# Patient Record
Sex: Male | Born: 1975 | Race: White | Hispanic: No | Marital: Married | Smoking: Never smoker
Health system: Southern US, Community
[De-identification: ages and names within clinical notes are randomized; demographics above are authoritative.]

## PROBLEM LIST (undated history)

## (undated) DIAGNOSIS — R51 Headache: Secondary | ICD-10-CM

## (undated) DIAGNOSIS — K219 Gastro-esophageal reflux disease without esophagitis: Secondary | ICD-10-CM

## (undated) DIAGNOSIS — J301 Allergic rhinitis due to pollen: Secondary | ICD-10-CM

## (undated) DIAGNOSIS — R519 Headache, unspecified: Secondary | ICD-10-CM

## (undated) DIAGNOSIS — B019 Varicella without complication: Secondary | ICD-10-CM

## (undated) DIAGNOSIS — M1990 Unspecified osteoarthritis, unspecified site: Secondary | ICD-10-CM

## (undated) HISTORY — PX: WISDOM TOOTH EXTRACTION: SHX21

## (undated) HISTORY — DX: Headache: R51

## (undated) HISTORY — DX: Allergic rhinitis due to pollen: J30.1

## (undated) HISTORY — DX: Varicella without complication: B01.9

## (undated) HISTORY — DX: Gastro-esophageal reflux disease without esophagitis: K21.9

## (undated) HISTORY — DX: Headache, unspecified: R51.9

---

## 2014-01-14 ENCOUNTER — Ambulatory Visit
Admission: RE | Admit: 2014-01-14 | Discharge: 2014-01-14 | Disposition: A | Discharge status: Home / Self Care | Payer: 59 | Source: Ambulatory Visit | Attending: Neurology | Admitting: Neurology

## 2014-01-14 ENCOUNTER — Other Ambulatory Visit: Payer: Self-pay | Admitting: Neurology

## 2014-01-14 DIAGNOSIS — M542 Cervicalgia: Secondary | ICD-10-CM

## 2014-01-14 IMAGING — CR DG CERVICAL SPINE COMPLETE 4+V
5 series · 5 of 5 positions shown · non-contrast
Comparison: None.

CLINICAL DATA: Right neck pain.  Headache.

EXAM:
CERVICAL SPINE  4+ VIEWS, including flexion and extension views

[view not recorded (1 of 5)]
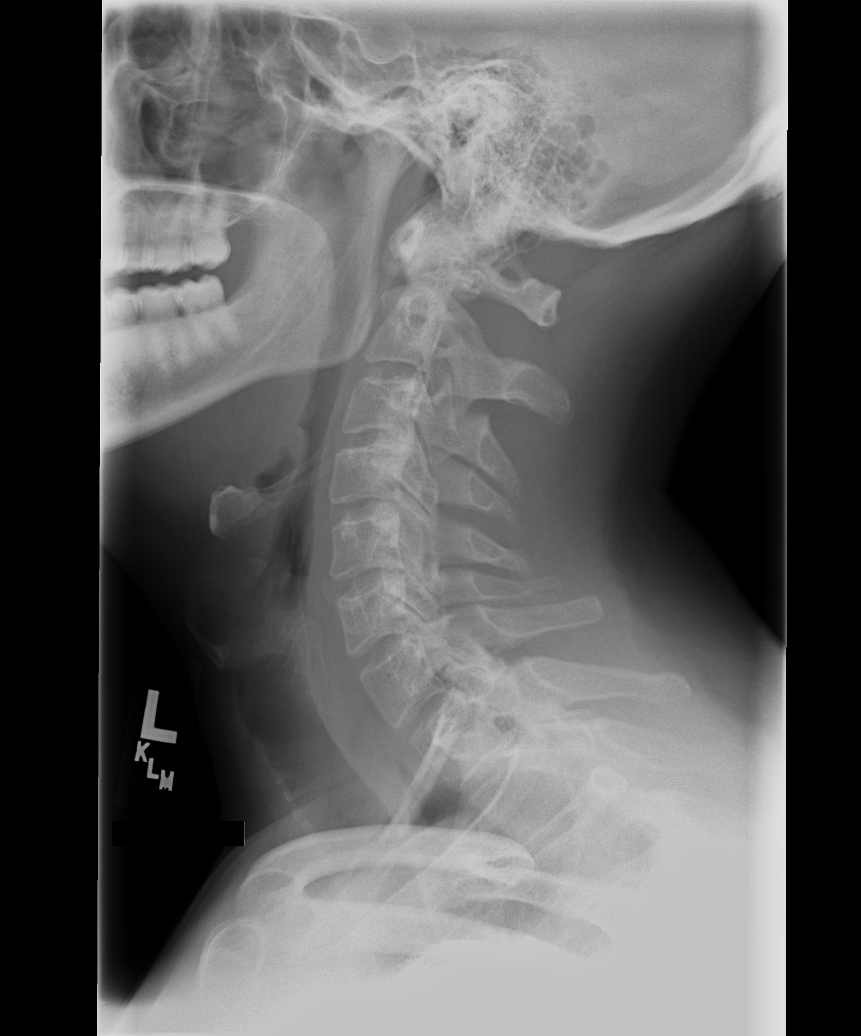

[view not recorded (2 of 5)]
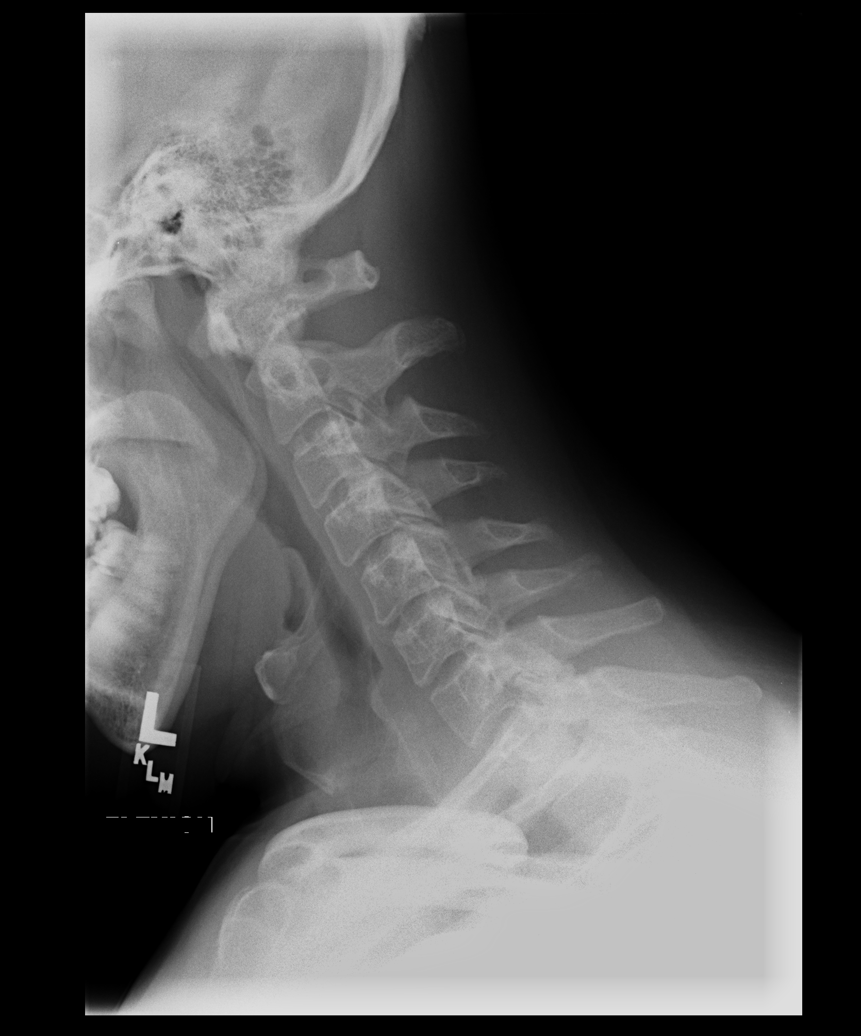

[view not recorded (3 of 5)]
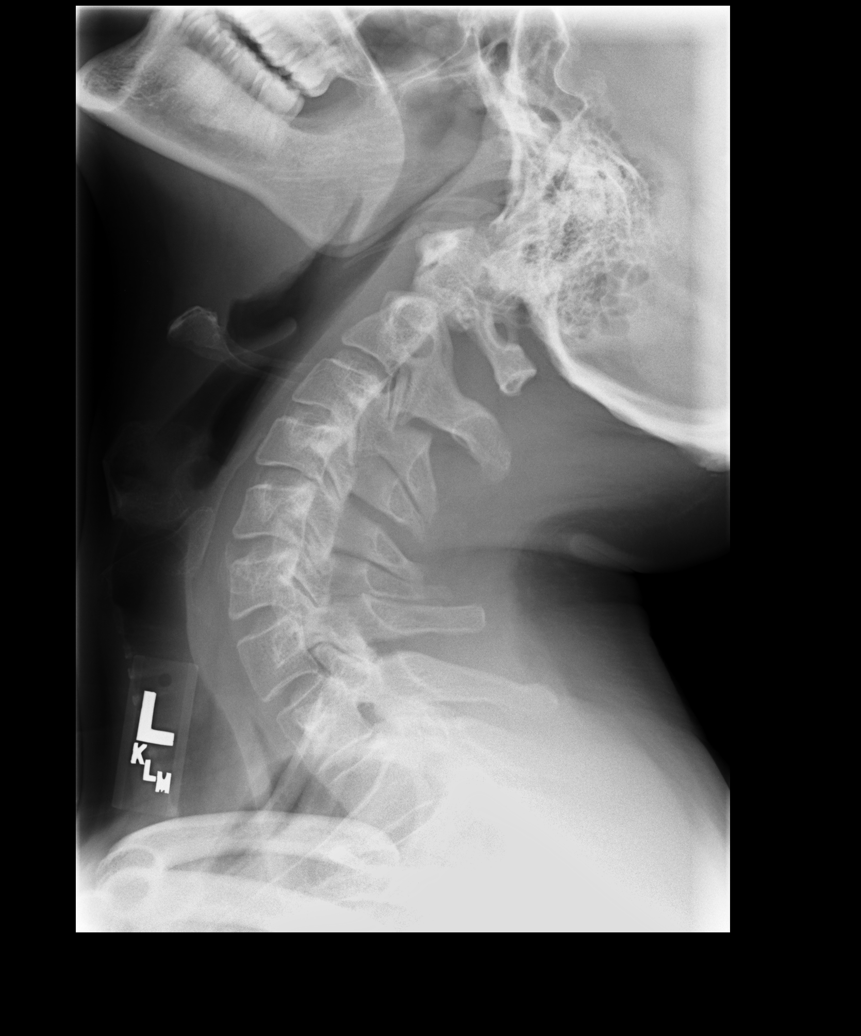

[view not recorded (4 of 5)]
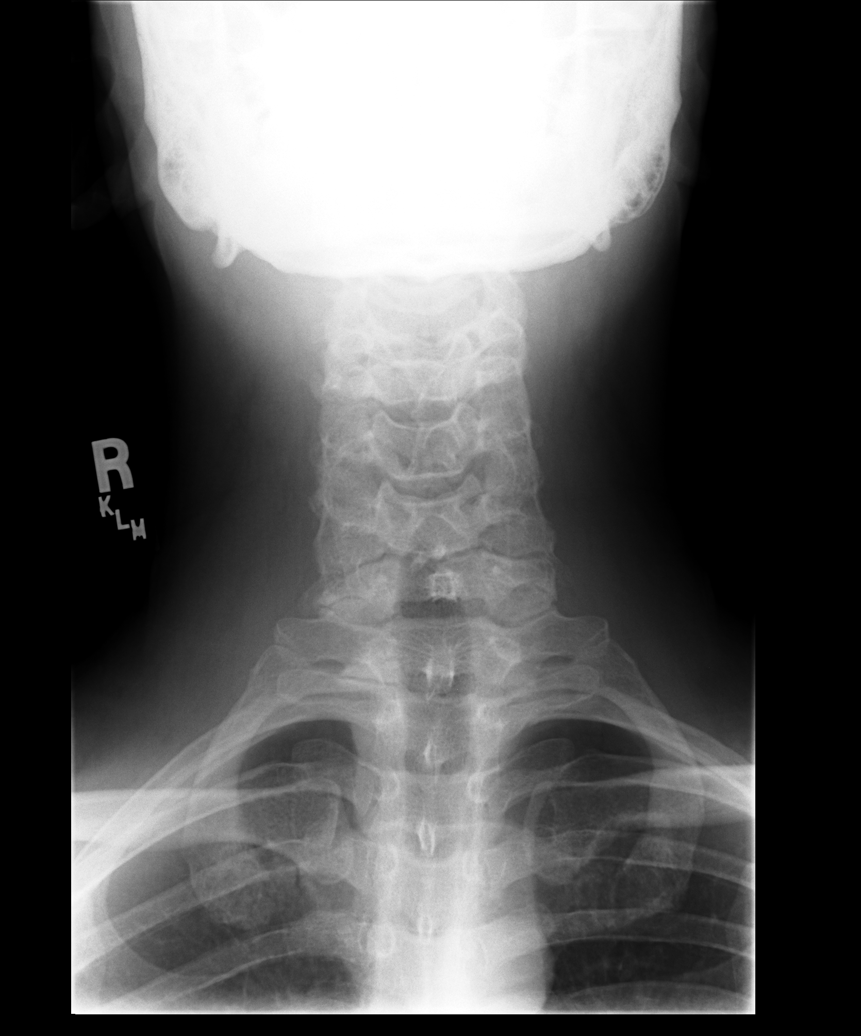

[view not recorded (5 of 5)]
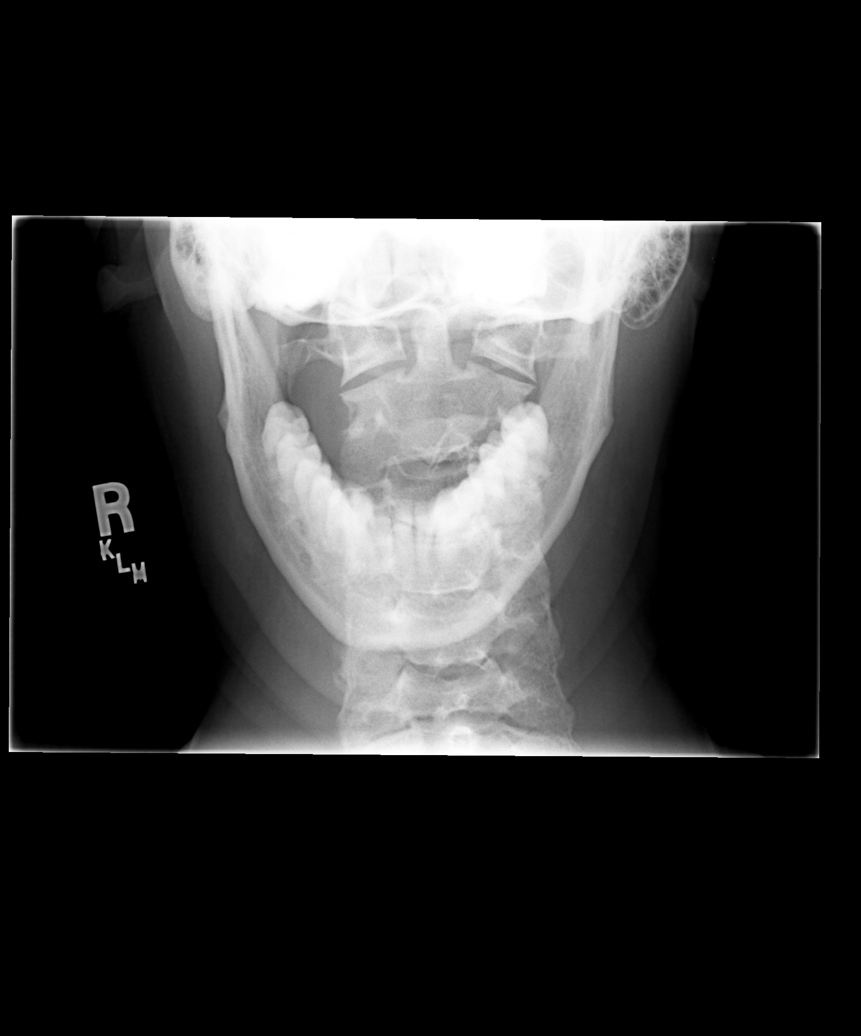

[5 of 5 positions shown; findings below may reference images not displayed]

FINDINGS: Mild loss of intervertebral disc height posteriorly at C3-4 with
mild posterior osseous ridging. No malalignment or abnormal motion
with flexion and extension. The spinous processes of C3 and C4 are
closely positioned in the neutral position, possibly related to the
disc space narrowing posteriorly.

No acute bony findings.
IMPRESSION: 1. Mild disc space narrowing posteriorly at C3-4 associated with
posterior osseous ridging. No abnormal subluxation with
flexion/extension

## 2016-01-19 ENCOUNTER — Ambulatory Visit (INDEPENDENT_AMBULATORY_CARE_PROVIDER_SITE_OTHER): Payer: 59

## 2016-01-19 ENCOUNTER — Encounter: Payer: Self-pay | Admitting: Podiatry

## 2016-01-19 ENCOUNTER — Ambulatory Visit (INDEPENDENT_AMBULATORY_CARE_PROVIDER_SITE_OTHER): Payer: 59 | Admitting: Podiatry

## 2016-01-19 VITALS — BP 119/85 | HR 91 | Resp 16 | Ht 68.0 in | Wt 190.0 lb

## 2016-01-19 DIAGNOSIS — M205X1 Other deformities of toe(s) (acquired), right foot: Secondary | ICD-10-CM | POA: Diagnosis not present

## 2016-01-19 DIAGNOSIS — M722 Plantar fascial fibromatosis: Secondary | ICD-10-CM

## 2016-01-19 MED ORDER — DICLOFENAC SODIUM 75 MG PO TBEC: 75.0000 mg | DELAYED_RELEASE_TABLET | Freq: Two times a day (BID) | ORAL | Status: DC

## 2016-01-19 NOTE — Patient Instructions (Signed)

## 2016-01-19 NOTE — Progress Notes (Signed)
   Subjective:    Patient ID: Gary Hayes, male    DOB: 1976/06/09, 40 y.o.   MRN: [id number]  HPI Patient presents with bilateral foot pain; arch & heel; pt stated, "has more pain in the arch"; on & off; x6 months.   Review of Systems  Musculoskeletal: Positive for myalgias.  All other systems reviewed and are negative.      Objective:   Physical Exam        Assessment & Plan:

## 2016-01-19 NOTE — Progress Notes (Signed)
Subjective:     Patient ID: Marikay Alaravid Kuck, male   DOB: [birth year], 40 y.o.   MRN: [id number]  HPI patient states I get pain in my arch right over left distal portion and it makes it hard for me to walk comfortably. Been going on for a number years and I like to run but I have not been able to run recently due to the discomfort and the pain has been more acute for the last 6 months   Review of Systems  All other systems reviewed and are negative.      Objective:   Physical Exam  Constitutional: He is oriented to person, place, and time.  Cardiovascular: Intact distal pulses.   Musculoskeletal: Normal range of motion.  Neurological: He is oriented to person, place, and time.  Skin: Skin is warm.  Nursing note and vitals reviewed.  neurovascular status intact muscle strength adequate range of motion was found to be within normal limits with patient found to have discomfort in the right distal arch of a moderate nature with what appears to be functional hallux limitus right over left. Moderate depression of the arch is noted upon weightbearing and is found to have good digital perfusion and is well oriented 3     Assessment:     Functional hallux limitus leading to probable chronic inflammation of the plantar arch of a moderate nature    Plan:     H&P x-rays reviewed condition discussed. At this point I recommended orthotics to provide for support the plantar arch and also to help plantar flex the first metatarsal and I placed on diclofenac 75 mg twice a day and gave stretching exercises. May require injection treatment or other modalities  X-ray report indicates that there is mild elevation first MPJ right and there is some changes in the lateral side of the joint surface. Minimal plantar spur formation noted

## 2016-02-09 ENCOUNTER — Ambulatory Visit: Payer: 59 | Admitting: *Deleted

## 2016-02-09 DIAGNOSIS — M722 Plantar fascial fibromatosis: Secondary | ICD-10-CM

## 2016-02-09 NOTE — Patient Instructions (Signed)

## 2016-02-09 NOTE — Progress Notes (Signed)
Patient ID: Gary Hayes, male   DOB: October 19, 1976, 40 y.o.   MRN: [id number] Patient presents for orthotic pick up.  Verbal and written break in and wear instructions given.  Patient will follow up in 4 weeks if symptoms worsen or fail to improve.

## 2016-11-13 ENCOUNTER — Telehealth: Payer: Self-pay | Admitting: Podiatry

## 2016-11-13 NOTE — Telephone Encounter (Signed)
Pt. Needs medical release form faxed over. (needs medical records and disc of x-ray)

## 2016-11-14 NOTE — Telephone Encounter (Signed)
I emailed the patient a copy of the medical records release form and let him know that if he wants a copy of his x-rays to a cd there is a $5.00 fee.

## 2016-11-15 DIAGNOSIS — R52 Pain, unspecified: Secondary | ICD-10-CM

## 2016-11-24 DIAGNOSIS — M79671 Pain in right foot: Secondary | ICD-10-CM | POA: Diagnosis not present

## 2016-11-24 DIAGNOSIS — M6701 Short Achilles tendon (acquired), right ankle: Secondary | ICD-10-CM | POA: Diagnosis not present

## 2016-11-24 DIAGNOSIS — M216X1 Other acquired deformities of right foot: Secondary | ICD-10-CM | POA: Diagnosis not present

## 2016-12-07 DIAGNOSIS — M6701 Short Achilles tendon (acquired), right ankle: Secondary | ICD-10-CM | POA: Diagnosis not present

## 2017-02-26 DIAGNOSIS — J329 Chronic sinusitis, unspecified: Secondary | ICD-10-CM | POA: Diagnosis not present

## 2017-02-26 DIAGNOSIS — B9789 Other viral agents as the cause of diseases classified elsewhere: Secondary | ICD-10-CM | POA: Diagnosis not present

## 2017-03-02 DIAGNOSIS — R030 Elevated blood-pressure reading, without diagnosis of hypertension: Secondary | ICD-10-CM | POA: Diagnosis not present

## 2017-03-02 DIAGNOSIS — J208 Acute bronchitis due to other specified organisms: Secondary | ICD-10-CM | POA: Diagnosis not present

## 2018-09-13 DIAGNOSIS — M79672 Pain in left foot: Secondary | ICD-10-CM | POA: Diagnosis not present

## 2018-12-20 ENCOUNTER — Ambulatory Visit (INDEPENDENT_AMBULATORY_CARE_PROVIDER_SITE_OTHER): Payer: 59 | Admitting: Family Medicine

## 2018-12-20 ENCOUNTER — Encounter: Payer: Self-pay | Admitting: Family Medicine

## 2018-12-20 VITALS — BP 104/80 | HR 82 | Temp 97.5°F | Ht 68.0 in | Wt 201.0 lb

## 2018-12-20 DIAGNOSIS — M6289 Other specified disorders of muscle: Secondary | ICD-10-CM

## 2018-12-20 DIAGNOSIS — Z7689 Persons encountering health services in other specified circumstances: Secondary | ICD-10-CM | POA: Diagnosis not present

## 2018-12-20 DIAGNOSIS — M722 Plantar fascial fibromatosis: Secondary | ICD-10-CM

## 2018-12-20 DIAGNOSIS — S43006A Unspecified dislocation of unspecified shoulder joint, initial encounter: Secondary | ICD-10-CM

## 2018-12-20 NOTE — Progress Notes (Signed)
Patient presents to clinic today to establish care.  SUBJECTIVE: PMH:  Pt is a 43 yo male with pmh sig for plantar fasciitis and h/o L shoulder separation.  Pt was being seen by UC.  Plantar fasciitis: -x yrs -tried stretching, yoga, orthotics, PT, ibuprofen -[facility] -now noticing tightness in posterior thigh and calf. -denies edema   H/o L shoulder separation: -occurred 20+ yrs ago -now noticing pain in L shoulder off and on  -may happen with certain movements or if lying on shoulder -will take ibuprofen prn  Allergies: NKDA  Past surgical hx: None  Social hx: Pt is married.  Pt works as an Acupuncturist.  Pt denies EtOH, tobacco, and drug use.   Family medical history: Mom-diabetes Dad-liver transplant 2/2 cirrhosis and tumor.  History reviewed. No pertinent past medical history.  History reviewed. No pertinent surgical history.  No current outpatient medications on file prior to visit.   No current facility-administered medications on file prior to visit.     No Known Allergies  History reviewed. No pertinent family history.  Social History   Socioeconomic History  . Marital status: Married    Spouse name: Not on file  . Number of children: Not on file  . Years of education: Not on file  . Highest education level: Not on file  Occupational History  . Not on file  Social Needs  . Financial resource strain: Not on file  . Food insecurity:    Worry: Not on file    Inability: Not on file  . Transportation needs:    Medical: Not on file    Non-medical: Not on file  Tobacco Use  . Smoking status: Never Smoker  . Smokeless tobacco: Never Used  Substance and Sexual Activity  . Alcohol use: Never    Alcohol/week: 0.0 standard drinks    Frequency: Never  . Drug use: Never  . Sexual activity: Not on file  Lifestyle  . Physical activity:    Days per week: Not on file    Minutes per session: Not  on file  . Stress: Not on file  Relationships  . Social connections:    Talks on phone: Not on file    Gets together: Not on file    Attends religious service: Not on file    Active member of club or organization: Not on file    Attends meetings of clubs or organizations: Not on file    Relationship status: Not on file  . Intimate partner violence:    Fear of current or ex partner: Not on file    Emotionally abused: Not on file    Physically abused: Not on file    Forced sexual activity: Not on file  Other Topics Concern  . Not on file  Social History Narrative  . Not on file    ROS General: Denies fever, chills, night sweats, changes in weight, changes in appetite HEENT: Denies headaches, ear pain, changes in vision, rhinorrhea, sore throat CV: Denies CP, palpitations, SOB, orthopnea Pulm: Denies SOB, cough, wheezing GI: Denies abdominal pain, nausea, vomiting, diarrhea, constipation GU: Denies dysuria, hematuria, frequency, vaginal discharge Msk: Denies muscle cramps, joint pains  +R leg and foot pain, L shoulder pain Neuro: Denies weakness, numbness, tingling Skin: Denies rashes, bruising Psych: Denies depression, anxiety, hallucinations   BP 104/80 (BP Location: Left Arm, Patient Position: Sitting, Cuff Size: Normal)   Pulse 82  Temp (!) 97.5 F (36.4 C) (Oral)   Ht 5\' 8"  (1.727 m)   Wt 201 lb (91.2 kg)   SpO2 97%   BMI 30.56 kg/m   Physical Exam Gen. Pleasant, well developed, well-nourished, in NAD HEENT - [place]/AT, PERRL, EOMI, conjunctive clear, no scleral icterus, no nasal drainage, pharynx without erythema or exudate. Lungs: no use of accessory muscles, CTAB, no wheezes, rales or rhonchi Cardiovascular: RRR,  No r/g/m, no peripheral edema Abdomen: BS present, soft, nontender,nondistended Musculoskeletal: No TTP of R leg, calf, or foot.  Negative Homans sign.  No deformities, moves all four extremities, no cyanosis or clubbing, normal tone, normal  strength. Neuro:  A&Ox3, CN II-XII intact, patient favors right leg when ambulating.  Has a wide-based gait Skin:  Warm, dry, intact, no lesions  No results found for this or any previous visit (from the past 2160 hour(s)).  Assessment/Plan: Plantar fasciitis  -Discussed continuing stretching -Use ibuprofen sparingly -Given handout - Plan: Ambulatory referral to Sports Medicine  Tightness of right gastrocnemius muscle -Continue stretching - Plan: Ambulatory referral to Sports Medicine  Shoulder separation -History of 20+ years ago -Likely with arthritis s/p injury -Discussed using ibuprofen sparingly, heat -Consider shoulder x-ray for continued symptoms  Encounter to establish care -We reviewed the PMH, PSH, FH, SH, Meds and Allergies. -We provided refills for any medications we will prescribe as needed. -We addressed current concerns per orders and patient instructions. -We have asked for records for pertinent exams, studies, vaccines and notes from previous providers. -We have advised patient to follow up per instructions below.  Follow-up PRN  Abbe Amsterdam, MD

## 2018-12-20 NOTE — Patient Instructions (Signed)
Plantar Fasciitis    Plantar fasciitis is a painful foot condition that affects the heel. It occurs when the band of tissue that connects the toes to the heel bone (plantar fascia) becomes irritated. This can happen as the result of exercising too much or doing other repetitive activities (overuse injury).  The pain from plantar fasciitis can range from mild irritation to severe pain that makes it difficult to walk or move. The pain is usually worse in the morning after sleeping, or after sitting or lying down for a while. Pain may also be worse after long periods of walking or standing.  What are the causes?  This condition may be caused by:   Standing for long periods of time.   Wearing shoes that do not have good arch support.   Doing activities that put stress on joints (high-impact activities), including running, aerobics, and ballet.   Being overweight.   An abnormal way of walking (gait).   Tight muscles in the back of your lower leg (calf).   High arches in your feet.   Starting a new athletic activity.  What are the signs or symptoms?  The main symptom of this condition is heel pain. Pain may:   Be worse with first steps after a time of rest, especially in the morning after sleeping or after you have been sitting or lying down for a while.   Be worse after long periods of standing still.   Decrease after 30-45 minutes of activity, such as gentle walking.  How is this diagnosed?  This condition may be diagnosed based on your medical history and your symptoms. Your health care provider may ask questions about your activity level. Your health care provider will do a physical exam to check for:   A tender area on the bottom of your foot.   A high arch in your foot.   Pain when you move your foot.   Difficulty moving your foot.  You may have imaging tests to confirm the diagnosis, such as:   X-rays.   Ultrasound.   MRI.  How is this treated?  Treatment for plantar fasciitis depends on how  severe your condition is. Treatment may include:   Rest, ice, applying pressure (compression), and raising the affected foot (elevation). This may be called RICE therapy. Your health care provider may recommend RICE therapy along with over-the-counter pain medicines to manage your pain.   Exercises to stretch your calves and your plantar fascia.   A splint that holds your foot in a stretched, upward position while you sleep (night splint).   Physical therapy to relieve symptoms and prevent problems in the future.   Injections of steroid medicine (cortisone) to relieve pain and inflammation.   Stimulating your plantar fascia with electrical impulses (extracorporeal shock wave therapy). This is usually the last treatment option before surgery.   Surgery, if other treatments have not worked after 12 months.  Follow these instructions at home:    Managing pain, stiffness, and swelling   If directed, put ice on the painful area:  ? Put ice in a plastic bag, or use a frozen bottle of water.  ? Place a towel between your skin and the bag or bottle.  ? Roll the bottom of your foot over the bag or bottle.  ? Do this for 20 minutes, 2-3 times a day.   Wear athletic shoes that have air-sole or gel-sole cushions, or try wearing soft shoe inserts that are designed for plantar   fasciitis.   Raise (elevate) your foot above the level of your heart while you are sitting or lying down.  Activity   Avoid activities that cause pain. Ask your health care provider what activities are safe for you.   Do physical therapy exercises and stretches as told by your health care provider.   Try activities and forms of exercise that are easier on your joints (low-impact). Examples include swimming, water aerobics, and biking.  General instructions   Take over-the-counter and prescription medicines only as told by your health care provider.   Wear a night splint while sleeping, if told by your health care provider. Loosen the splint  if your toes tingle, become numb, or turn cold and blue.   Maintain a healthy weight, or work with your health care provider to lose weight as needed.   Keep all follow-up visits as told by your health care provider. This is important.  Contact a health care provider if you:   Have symptoms that do not go away after caring for yourself at home.   Have pain that gets worse.   Have pain that affects your ability to move or do your daily activities.  Summary   Plantar fasciitis is a painful foot condition that affects the heel. It occurs when the band of tissue that connects the toes to the heel bone (plantar fascia) becomes irritated.   The main symptom of this condition is heel pain that may be worse after exercising too much or standing still for a long time.   Treatment varies, but it usually starts with rest, ice, compression, and elevation (RICE therapy) and over-the-counter medicines to manage pain.  This information is not intended to replace advice given to you by your health care provider. Make sure you discuss any questions you have with your health care provider.  Document Released: 07/04/2001 Document Revised: 08/06/2017 Document Reviewed: 08/06/2017  [employer]vier Interactive Patient Education  2019 [employer]        Plantar Fasciitis Rehab  Ask your health care provider which exercises are safe for you. Do exercises exactly as told by your health care provider and adjust them as directed. It is normal to feel mild stretching, pulling, tightness, or discomfort as you do these exercises, but you should stop right away if you feel sudden pain or your pain gets worse. Do not begin these exercises until told by your health care provider.  Stretching and range of motion exercises  These exercises warm up your muscles and joints and improve the movement and flexibility of your foot. These exercises also help to relieve pain.  Exercise A: Plantar fascia stretch    1. Sit with your left / right leg crossed  over your opposite knee.  2. Hold your heel with one hand with that thumb near your arch. With your other hand, hold your toes and gently pull them back toward the top of your foot. You should feel a stretch on the bottom of your toes or your foot or both.  3. Hold this stretch for__________ seconds.  4. Slowly release your toes and return to the starting position.  Repeat __________ times. Complete this exercise __________ times a day.  Exercise B: Gastroc, standing    1. Stand with your hands against a wall.  2. Extend your left / right leg behind you, and bend your front knee slightly.  3. Keeping your heels on the floor and keeping your back knee straight, shift your weight toward the   wall without arching your back. You should feel a gentle stretch in your left / right calf.  4. Hold this position for __________ seconds.  Repeat __________ times. Complete this exercise __________ times a day.  Exercise C: Soleus, standing  1. Stand with your hands against a wall.  2. Extend your left / right leg behind you, and bend your front knee slightly.  3. Keeping your heels on the floor, bend your back knee and slightly shift your weight over the back leg. You should feel a gentle stretch deep in your calf.  4. Hold this position for __________ seconds.  Repeat __________ times. Complete this exercise __________ times a day.  Exercise D: Gastrocsoleus, standing  1. Stand with the ball of your left / right foot on a step. The ball of your foot is on the walking surface, right under your toes.  2. Keep your other foot firmly on the same step.  3. Hold onto the wall or a railing for balance.  4. Slowly lift your other foot, allowing your body weight to press your heel down over the edge of the step. You should feel a stretch in your left / right calf.  5. Hold this position for __________ seconds.  6. Return both feet to the step.  7. Repeat this exercise with a slight bend in your left / right knee.  Repeat __________ times  with your left / right knee straight and __________ times with your left / right knee bent. Complete this exercise __________ times a day.  Balance exercise  This exercise builds your balance and strength control of your arch to help take pressure off your plantar fascia.  Exercise E: Single leg stand  1. Without shoes, stand near a railing or in a doorway. You may hold onto the railing or door frame as needed.  2. Stand on your left / right foot. Keep your big toe down on the floor and try to keep your arch lifted. Do not let your foot roll inward.  3. Hold this position for __________ seconds.  4. If this exercise is too easy, you can try it with your eyes closed or while standing on a pillow.  Repeat __________ times. Complete this exercise __________ times a day.  This information is not intended to replace advice given to you by your health care provider. Make sure you discuss any questions you have with your health care provider.  Document Released: 10/09/2005 Document Revised: 06/13/2016 Document Reviewed: 08/23/2015  Elsevier Interactive Patient Education  2019 [employer]

## 2018-12-24 ENCOUNTER — Encounter: Payer: Self-pay | Admitting: Sports Medicine

## 2018-12-24 ENCOUNTER — Ambulatory Visit: Payer: Self-pay

## 2018-12-24 ENCOUNTER — Ambulatory Visit (INDEPENDENT_AMBULATORY_CARE_PROVIDER_SITE_OTHER): Payer: 59 | Admitting: Sports Medicine

## 2018-12-24 VITALS — BP 120/80 | HR 90 | Ht 68.0 in | Wt 200.8 lb

## 2018-12-24 DIAGNOSIS — M24551 Contracture, right hip: Secondary | ICD-10-CM | POA: Diagnosis not present

## 2018-12-24 DIAGNOSIS — M6289 Other specified disorders of muscle: Secondary | ICD-10-CM | POA: Diagnosis not present

## 2018-12-24 DIAGNOSIS — M76829 Posterior tibial tendinitis, unspecified leg: Secondary | ICD-10-CM | POA: Diagnosis not present

## 2018-12-24 MED ORDER — GABAPENTIN 300 MG PO CAPS: ORAL_CAPSULE | ORAL | 1 refills | Status: DC

## 2018-12-24 NOTE — Procedures (Signed)
LIMITED MSK ULTRASOUND OF Right ankle, foot Images were obtained and interpreted by myself, Gaspar Bidding, DO  Images have been saved and stored to PACS system. Images obtained on: GE S7 Ultrasound machine  FINDINGS:   Plantar fascia is normal-appearing with a normal thickness.  There is no significant surrounding hypoechoic change.  Achilles tendon is normal thickness with no surrounding or interstitial changes.    Musculotendinous junction of the calf and medial gastroc are normal.  IMPRESSION:  1. Normal-appearing plantar fascia 2. Normal-appearing Achilles tendon 3. Musculotendinous junction of the gastroc and Achilles is normal-appearing however there does appear to be some denervation atrophy of the soleus muscle.

## 2018-12-24 NOTE — Progress Notes (Signed)
Gary Hayes. Gary Hayes at [facility] [id number]  Gary Hayes - 43 y.o. male MRN [id number]  Date of birth: 09/05/1976  Visit Date: December 24, 2018  PCP: Gary Saint, MD   Referred by: Gary Saint, MD  SUBJECTIVE:  Chief Complaint  Patient presents with  . New Patient (Initial Visit)    R calf and plantar fasciitis.  Ref by Dr. Salomon Hayes.  Has been doing calf and PF stretching.  Orthotics in the past.    HPI: Patient presents with a long history of right leg and calf pain that is been worsened while he was previously running on a regular basis.  He has had just generalized tightness that is resulted in some posterior calcaneal pain both on the plantar aspect and the pure posterior aspect.  He has had custom orthotics previously fabricated.  He is undergone physical therapy has been working with stretching and yoga exercises.  He has had issues with metatarsalgia and custom insoles have made first ray cut out that it provided some benefit to his great toe pain.  He has had plantar fascia exercise as well as Achilles stretching that have not provided significant benefit.  He does report some underlying back pain but this is minimal.  REVIEW OF SYSTEMS: No significant nighttime awakenings due to this issue. Denies fevers, chills, recent weight gain or weight loss.  No night sweats.  Pt denies any change in bowel or bladder habits, muscle weakness, numbness or falls associated with this pain. Otherwise 12 point review of systems performed and is negative   HISTORY:  Prior history reviewed and updated per electronic medical record.  Patient Active Problem List   Diagnosis Date Noted  . Plantar fasciitis 12/20/2018  . Tightness of right gastrocnemius muscle 12/20/2018  . Shoulder separation 12/20/2018   Social History   Occupational History  . Not on file  Tobacco Use  . Smoking status: Never Smoker  . Smokeless tobacco:  Never Used  Substance and Sexual Activity  . Alcohol use: Never    Alcohol/week: 0.0 standard drinks    Frequency: Never  . Drug use: Never  . Sexual activity: Not on file   Social History   Social History Narrative  . Not on file   Past Medical History:  Diagnosis Date  . Chicken pox   . Frequent headaches   . GERD (gastroesophageal reflux disease)   . Hay fever    History reviewed. No pertinent surgical history. family history includes Cancer in his father; Diabetes in his mother; Heart disease in his paternal grandmother.  OBJECTIVE:  VS:  HT:5\' 8"  (172.7 cm)   WT:200 lb 12.8 oz (91.1 kg)  BMI:30.54    BP:120/80  HR:90bpm  TEMP: ( )  RESP:93 %   PHYSICAL EXAM: CONSTITUTIONAL: Well-developed, Well-nourished and In no acute distress EYES: Pupils are equal., EOM intact without nystagmus. and No scleral icterus. Psychiatric: Alert & appropriately interactive. and Not depressed or anxious appearing. EXTREMITY EXAM: Warm and well perfused  Bilateral lower extremities are overall well aligned.  He does have tightness within his gastroc right worse than left.  He has poor posterior tibialis recruitment with toe off on the right.  Additionally he does have a right hip flexor contracture while laying supine.  He has some weakness with his hip abductor's as well.  Negative straight leg raise although he does have some pain with popliteal compression test on the right.  ASSESSMENT:   1. Tightness of right gastrocnemius muscle   2. Posterior tibialis tendon insufficiency   3. Right hip flexor tightness     PROCEDURES:  PROCEDURE NOTE: THERAPEUTIC EXERCISES (28206)   Discussed the foundation of treatment for this condition is physical therapy and/or daily (5-6 days/week) therapeutic exercises, focusing on core strengthening, coordination, neuromuscular control/reeducation. 15 minutes spent for Therapeutic exercises as below and as referenced in the AVS. This included exercises  focusing on stretching, strengthening, with significant focus on eccentric aspects.  Proper technique shown and discussed handout in great detail with ATC. All questions were discussed and answered.   Long term goals include an improvement in range of motion, strength, endurance as well as avoiding reinjury. Frequency of visits is one time as determined during today's office visit. Frequency of exercises to be performed is as per handout.  EXERCISES REVIEWED:   Gary Hayes Exercises Pelvic recruitment/tilting Hip flexor stretching Pigeon Toed walking      PLAN:  Pertinent additional documentation may be included in corresponding procedure notes, imaging studies, problem based documentation and patient instructions.  No problem-specific Assessment & Plan notes found for this encounter.   Actually that some of the pain is coming from is coming from a radiculitis from his back.  No red flag symptoms will defer further diagnostic evaluation this time we will start him on gabapentin as well as more of a core recruitment and strengthening program.  If any lack of improvement can consider further diagnostic evaluation of his lumbar spine with plain film x-rays and possible MRI.  The other consideration for this type of issue could be exertional compartment syndrome versus popliteal artery entrapment syndrome.  The custom orthotics that he have do appear to be adequate and given the findings on the ultrasound today as well as somewhat normal x-rays reported per emergent Ortho as well as his prior x-rays from [facility] further evaluation of his actual intrinsic foot structures are likely of low utility.  Links to [employer] provided today per Patient Instructions.  These exercises were developed by Gary Hayes, DC with a strong emphasis on core neuromuscular reducation and postural realignment through body-weight exercises.  Home Therapeutic exercises prescribed today per procedure  note.  Activity modifications and the importance of avoiding exacerbating activities (limiting pain to no more than a 4 / 10 during or following activity) recommended and discussed.  Discussed red flag symptoms that warrant earlier emergent evaluation and patient voices understanding.  Meds ordered this encounter  Medications  . gabapentin (NEURONTIN) 300 MG capsule    Sig: Start with 1 tab po qhs X 1 week, then increase to 1 tab po bid X 1 week then 1 tab po tid prn    Dispense:  90 capsule    Refill:  1   Lab Orders  No laboratory test(s) ordered today    Imaging Orders     [place] [facility] POCT ULTRASOUND Referral Orders  No referral(s) requested today    Return in about 6 weeks (around 02/04/2019).          Andrena Mews, DO    [facility]

## 2018-12-24 NOTE — Patient Instructions (Addendum)
Also check out [employer]" which is a program developed by Dr. Myles Lipps.   There are links to a couple of his YouTube Videos below and I would like to see you performing one of his videos 5-6 days per week.  It is best to do these exercises first thing in the morning.  They will give you a good jumpstart here today and start normalizing the way you move.  A good intro video is: "Independence from Pain 7-minute Video" - [email or web address]   A more advanced video is: Scientist, research (medical) original 12 minutes" - [email or web address]  Exercises that focus more on the neck are as below: Dr. Derrill Kay with [employer] teaching neck and shoulder details Part 1 - https://[email or web address].[email or web address] Part 2 Dr. Derrill Kay with [facility] quick routine to practice daily - https://youtu.be/[email or web address]  Do not try to attempt the entire video when first beginning.  Try breaking of each exercise that he goes into shorter segments.  In other words, if they perform an exercise for 45 seconds, start with 15 seconds and rest and then resume when they begin the new activity.  If you work your way up to being able to do these videos without having to stop, I expect you will see significant improvements in your pain.  If you enjoy his videos and would like to find out more you can look on his website: [email or web address].  He has a workout streaming option as well as a DVD set available for purchase.  Amazon has the best price for his DVDs.     Please perform the exercise program that we have prepared for you and gone over in detail on a daily basis.  In addition to the handout you were provided you can access your program through: [email or web address]   Your unique program code is: [email or web address]

## 2018-12-26 ENCOUNTER — Encounter: Payer: Self-pay | Admitting: Sports Medicine

## 2019-02-04 ENCOUNTER — Ambulatory Visit (INDEPENDENT_AMBULATORY_CARE_PROVIDER_SITE_OTHER): Payer: 59 | Admitting: Sports Medicine

## 2019-02-04 ENCOUNTER — Encounter: Payer: Self-pay | Admitting: Sports Medicine

## 2019-02-04 VITALS — Ht 68.0 in | Wt 182.0 lb

## 2019-02-04 DIAGNOSIS — M24551 Contracture, right hip: Secondary | ICD-10-CM

## 2019-02-04 DIAGNOSIS — G8929 Other chronic pain: Secondary | ICD-10-CM

## 2019-02-04 DIAGNOSIS — M5441 Lumbago with sciatica, right side: Secondary | ICD-10-CM | POA: Diagnosis not present

## 2019-02-04 DIAGNOSIS — M76829 Posterior tibial tendinitis, unspecified leg: Secondary | ICD-10-CM

## 2019-02-04 DIAGNOSIS — M6289 Other specified disorders of muscle: Secondary | ICD-10-CM | POA: Diagnosis not present

## 2019-02-04 NOTE — Progress Notes (Signed)
Veverly FellsMichael D. Delorise Shinerigby, DO  [facility] [place] [phone]  Pernell DupreDavid Alan Wickliff - 43 y.o. male MRN [id number]  Date of birth: [birth year]  Visit Date: 02/04/2019  PCP: Deeann SaintBanks, Shannon R, MD   Referred by: Deeann SaintBanks, Shannon R, MD   Virtual Visit via Video (WebEx)  I connected with Pernell Dupreavid Alan Metts on 02/04/19 at  8:40 AM EDT by a video enabled telemedicine application and verified that I am speaking with the correct person using two identifiers. Location patient: Home Location provider: Provider office Persons participating in the virtual visit: Patient only  I discussed the limitations of evaluation and management by telemedicine and the availability of in person appointments. The patient expressed understanding and agreed to proceed.   SUBJECTIVE:   Chief Complaint  Patient presents with  . Follow-up    Leg pain    HPI: Patient reports overall he is doing remarkably better.  He has noticed that the gabapentin has effectively relieved all of his calf and leg symptoms.  If he misses a dose he is having recurrent symptoms that are noticeable.  He has been doing the foundations exercises as well as home therapeutic exercises and enjoys these and feels as though they are significantly beneficial.  He is not having any new or different symptoms and is reporting almost 75 to 80% improvement in his day-to-day discomfort.  REVIEW OF SYSTEMS: No significant nighttime awakenings due to this issue. Denies fevers, chills, recent weight gain or weight loss.  No night sweats.  Pt denies any change in bowel or bladder habits, muscle weakness, numbness or falls associated with this pain.  HISTORY:  Prior history reviewed and updated per electronic medical record.  Patient Active Problem List   Diagnosis Date Noted  . Plantar fasciitis 12/20/2018  . Tightness of right gastrocnemius muscle 12/20/2018  . Shoulder separation 12/20/2018   Social History    Occupational History  . Not on file  Tobacco Use  . Smoking status: Never Smoker  . Smokeless tobacco: Never Used  Substance and Sexual Activity  . Alcohol use: Never    Alcohol/week: 0.0 standard drinks    Frequency: Never  . Drug use: Never  . Sexual activity: Not on file   Social History   Social History Narrative  . Not on file    OBJECTIVE:  VS:  HT:5\' 8"  (172.7 cm)   WT:182 lb (82.6 kg)  BMI:27.68    BP:   HR: bpm  TEMP: ( )  RESP:    PHYSICAL EXAM: GENERAL: Alert, appears well and in no acute distress. HEENT: Atraumatic, conjunctiva clear, no obvious abnormalities on inspection of external nose and ears. NECK: Normal movements of the head and neck. CARDIOPULMONARY: No increased WOB. Speaking in clear sentences. I:E ratio WNL.  MS: Moves all visible extremities without noticeable abnormality. PSYCH: Pleasant and cooperative, well-groomed. Speech normal rate and rhythm. Affect is appropriate. Insight and judgement are appropriate. Attention is focused, linear, and appropriate.  NEURO: CN grossly intact. Oriented as arrived to appointment on time with no prompting. Moves both UE equally.  SKIN: No obvious lesions, wounds, erythema, or cyanosis noted on face or hands.    ASSESSMENT:   1. Tightness of right gastrocnemius muscle   2. Posterior tibialis tendon insufficiency   3. Right hip flexor tightness   4. Chronic bilateral low back pain with right-sided sciatica     PROCEDURES:  None  PLAN:  Pertinent additional documentation may be included in  corresponding procedure notes, imaging studies, problem based documentation and patient instructions.  No problem-specific Assessment & Plan notes found for this encounter.   Overall markedly improved.  It does sound like this is coming likely more from a true radicular issue more so than intrinsic foot or ankle symptoms as suspected.  He is showing good improvement and is noticing that the neck and back pain he was  previously having is also better.  Continue with the gabapentin for likely an additional 6 weeks.  IT sales professional Program: These exercises were developed by Myles Lipps, DC with a strong emphasis on core neuromuscular reducation and postural realignment through body-weight exercises. Additional benefits of the subscription service discussed   Home Therapeutic Exercises: Continue previously prescribed home exercise program Discussed the underlying features of tight hip flexors leading to crouched, fetal like position that results in spinal column compression.  Including lumbar hyperflexion with hypermobility, thoracic flexion with restrictive rotation and cervical lordosis reversal.   Activity modifications and the importance of avoiding exacerbating activities (limiting pain to no more than a 4 / 10 during or following activity) recommended and discussed.   Discussed red flag symptoms that warrant earlier emergent evaluation and patient voices understanding.   No orders of the defined types were placed in this encounter.  Lab Orders  No laboratory test(s) ordered today   Imaging Orders  No imaging studies ordered today   Referral Orders  No referral(s) requested today    Return in about 6 weeks (around 03/18/2019) for repeat clinical exam.          Andrena Mews, DO    [facility] Physician

## 2019-02-04 NOTE — Patient Instructions (Signed)
Keep up the good work

## 2019-03-17 NOTE — Progress Notes (Signed)
[facility] [address] [place], [place] [address] Phone: [phone] Subjective:   I Gary Hayes am serving as a Neurosurgeon for Dr. Antoine Primas.   CC: Foot, ankle and calf pain follow-up  [id number]  Gary Hayes is a 43 y.o. male coming in with complaint of Calf and foot pain. States he is doing much better. Patient states that he is feeling 80% better.  Feels like different shoe choices have been more beneficial as well as some of the exercises.  Patient was seen by another provider and started on gabapentin.  Does not feel like that has made any significant benefit at this time.    Past Medical History:  Diagnosis Date  . Chicken pox   . Frequent headaches   . GERD (gastroesophageal reflux disease)   . Hay fever    No past surgical history on file. Social History   Socioeconomic History  . Marital status: Married    Spouse name: Not on file  . Number of children: Not on file  . Years of education: Not on file  . Highest education level: Not on file  Occupational History  . Not on file  Social Needs  . Financial resource strain: Not on file  . Food insecurity:    Worry: Not on file    Inability: Not on file  . Transportation needs:    Medical: Not on file    Non-medical: Not on file  Tobacco Use  . Smoking status: Never Smoker  . Smokeless tobacco: Never Used  Substance and Sexual Activity  . Alcohol use: Never    Alcohol/week: 0.0 standard drinks    Frequency: Never  . Drug use: Never  . Sexual activity: Not on file  Lifestyle  . Physical activity:    Days per week: Not on file    Minutes per session: Not on file  . Stress: Not on file  Relationships  . Social connections:    Talks on phone: Not on file    Gets together: Not on file    Attends religious service: Not on file    Active member of club or organization: Not on file    Attends meetings of clubs or organizations: Not on file    Relationship status: Not  on file  Other Topics Concern  . Not on file  Social History Narrative  . Not on file   No Known Allergies Family History  Problem Relation Age of Onset  . Diabetes Mother   . Cancer Father   . Heart disease Paternal Grandmother          Current Outpatient Medications (Other):  .  gabapentin (NEURONTIN) 300 MG capsule, Start with 1 tab po qhs X 1 week, then increase to 1 tab po bid X 1 week then 1 tab po tid prn (Patient taking differently: 2 (two) times daily. )    Past medical history, social, surgical and family history all reviewed in electronic medical record.  No pertanent information unless stated regarding to the chief complaint.   Review of Systems:  No headache, visual changes, nausea, vomiting, diarrhea, constipation, dizziness, abdominal pain, skin rash, fevers, chills, night sweats, weight loss, swollen lymph nodes, body aches, joint swelling,chest pain, shortness of breath, mood changes.  Positive muscle aches  Objective  Blood pressure 126/84, pulse 80, height 5\' 8"  (1.727 Hayes), weight 191 lb (86.6 kg), SpO2 98 %.   General: No apparent distress alert and oriented x3 mood  and affect normal, dressed appropriately.  HEENT: Pupils equal, extraocular movements intact  Respiratory: Patient's speak in full sentences and does not appear short of breath  Cardiovascular: No lower extremity edema, non tender, no erythema  Skin: Warm dry intact with no signs of infection or rash on extremities or on axial skeleton.  Abdomen: Soft nontender  Neuro: Cranial nerves II through XII are intact, neurovascularly intact in all extremities with 2+ DTRs and 2+ pulses.  Lymph: No lymphadenopathy of posterior or anterior cervical chain or axillae bilaterally.  Gait normal with good balance and coordination.  MSK:  Non tender with full range of motion and good stability and symmetric strength and tone of shoulders, elbows, wrist, hip, knee and ankles bilaterally.  Foot exam bilaterally  shows the patient does have some mild overpronation of the midfoot but actually over supination of the hindfoot right greater than left.  Mild breakdown of the longitudinal arch as well as the transverse arch.  Patient dynamically over the walking seems to still have a arch that works very well.   Impression and Recommendations:    . The above documentation has been reviewed and is accurate and complete Gary Hayes , DO       Note: This dictation was prepared with Dragon dictation along with smaller phrase technology. Any transcriptional errors that result from this process are unintentional.

## 2019-03-18 ENCOUNTER — Ambulatory Visit (INDEPENDENT_AMBULATORY_CARE_PROVIDER_SITE_OTHER): Payer: 59 | Admitting: Family Medicine

## 2019-03-18 ENCOUNTER — Other Ambulatory Visit: Payer: Self-pay

## 2019-03-18 ENCOUNTER — Ambulatory Visit: Payer: 59 | Admitting: Sports Medicine

## 2019-03-18 ENCOUNTER — Encounter: Payer: Self-pay | Admitting: Family Medicine

## 2019-03-18 DIAGNOSIS — M6289 Other specified disorders of muscle: Secondary | ICD-10-CM | POA: Diagnosis not present

## 2019-03-18 NOTE — Assessment & Plan Note (Signed)
Patient has had some difficulty with the posterior capsule of the ankle.  Overall though it seems to be doing well.  Discussed with patient in great length that I do not feel that this is likely coming from the back and do not know if gabapentin would be beneficial to continue long-term.  We discussed over-the-counter medicines, over-the-counter orthotics, more discussed heel lift to potentially give some relaxation to the calf.  Discussed compression with working out and patient is not feeling good will come back again in 6 weeks.

## 2019-03-18 NOTE — Patient Instructions (Signed)
Good to see you  Ice is your friend when needed [employer] orthotics "total support" online would be great  Continue the exercises  I am ok with you working out as much as you want Add vitamin D 2000 IU daily  Heel lift 1/16 inch could help  See me again in 6-8 weeks

## 2019-04-28 ENCOUNTER — Ambulatory Visit: Payer: 59 | Admitting: Family Medicine

## 2019-06-05 ENCOUNTER — Telehealth: Payer: Self-pay

## 2019-06-05 NOTE — Telephone Encounter (Signed)
Spoke with patient to schedule him with Dr. Tamala Julian. Patient requests earlier appointment than what offered. Asked for recommendations on another provider. Dr. Clovis Riley info given to patient.

## 2020-03-24 ENCOUNTER — Encounter: Payer: Self-pay | Admitting: Family Medicine

## 2020-03-25 ENCOUNTER — Other Ambulatory Visit: Payer: Self-pay

## 2020-03-26 ENCOUNTER — Ambulatory Visit (INDEPENDENT_AMBULATORY_CARE_PROVIDER_SITE_OTHER): Payer: 59 | Admitting: Family Medicine

## 2020-03-26 ENCOUNTER — Encounter: Payer: Self-pay | Admitting: Family Medicine

## 2020-03-26 VITALS — BP 108/76 | HR 86 | Temp 97.8°F | Wt 177.0 lb

## 2020-03-26 DIAGNOSIS — M7541 Impingement syndrome of right shoulder: Secondary | ICD-10-CM | POA: Diagnosis not present

## 2020-03-26 DIAGNOSIS — L821 Other seborrheic keratosis: Secondary | ICD-10-CM | POA: Diagnosis not present

## 2020-03-26 DIAGNOSIS — M629 Disorder of muscle, unspecified: Secondary | ICD-10-CM

## 2020-03-26 DIAGNOSIS — M5382 Other specified dorsopathies, cervical region: Secondary | ICD-10-CM

## 2020-03-26 MED ORDER — PREDNISONE 10 MG PO TABS: ORAL_TABLET | ORAL | 0 refills | Status: DC

## 2020-03-26 NOTE — Progress Notes (Signed)
Subjective:    Patient ID: Pernell Dupre, male    DOB: Feb 11, 1976, 44 y.o.   MRN: [id number]  No chief complaint on file.   HPI Patient was seen today for ongoing concern.  Pt with a mole on right side of face that is becoming itchy.  Pt would like it removed.  Denies bleeding or changes in the area.  Pt also mentions bilateral shoulder pain/stiffness.  Went to physical therapy x9 months.  Working on posture no longer sleeping on sides.  Notes continued symptoms.  Pt denies weakness in upper extremities.  At times has a full sense of in R forearm and tenderness in right upper lateral pec and shoulder.  Past Medical History:  Diagnosis Date  . Chicken pox   . Frequent headaches   . GERD (gastroesophageal reflux disease)   . Hay fever     No Known Allergies  ROS General: Denies fever, chills, night sweats, changes in weight, changes in appetite HEENT: Denies headaches, ear pain, changes in vision, rhinorrhea, sore throat CV: Denies CP, palpitations, SOB, orthopnea Pulm: Denies SOB, cough, wheezing GI: Denies abdominal pain, nausea, vomiting, diarrhea, constipation GU: Denies dysuria, hematuria, frequency, vaginal discharge Msk: Denies muscle cramps  + tightness in her neck and shoulders, right shoulder pain and forearm bones Neuro: Denies weakness, numbness, tingling Skin: Denies rashes, bruising  + mole on right face Psych: Denies depression, anxiety, hallucinations    Objective:    Blood pressure 108/76, pulse 86, temperature 97.8 F (36.6 C), temperature source Temporal, weight 177 lb (80.3 kg), SpO2 97 %.   Gen. Pleasant, well-nourished, in no distress, normal affect  HEENT: [place]/AT, face symmetric, no scleral icterus, PERRLA, EOMI, nares patent without drainage Lungs: no accessory muscle use, CTAB, no wheezes or rales Cardiovascular: RRR, no m/r/g, no peripheral edema Musculoskeletal: TTP of right AC joint and long head of biceps brachii.  Positive right arm cross  body.normal ROM.  TTP of the right upper trapezius.  no deformities, no cyanosis or clubbing, normal tone Neuro:  A&Ox3, CN II-XII intact, normal gait Skin:  Warm, no lesions/ rash.  Seborrheic keratosis on right lateral face.  Cryotherapy applied to lesion.  Procedure note: Liquid nitrogen was applied for 10-12 seconds to the skin lesion on right base and the expected blistering or scabbing reaction explained. Do not pick at the areas. Patient reminded to expect hypopigmented scars from the procedure. Return if lesions fail to fully resolve.  Patient tolerated procedure well.   Wt Readings from Last 3 Encounters:  03/26/20 177 lb (80.3 kg)  03/18/19 191 lb (86.6 kg)  02/04/19 182 lb (82.6 kg)    No results found for: WBC, HGB, HCT, PLT, GLUCOSE, CHOL, TRIG, HDL, LDLDIRECT, LDLCALC, ALT, AST, NA, K, CL, CREATININE, BUN, CO2, TSH, PSA, INR, GLUF, HGBA1C, MICROALBUR  Assessment/Plan:  Seborrheic keratosis -Consider cryotherapy applied to lesion on right face.  Patient tolerated procedure well -Given precautions -Given handout -Follow-up as needed.  Impingement syndrome of shoulder region, right -Discussed physical therapy and consider referral to Ortho.  Patient wishes to wait at this time -Discussed steroid injection versus p.o. prednisone.  Will try p.o. prednisone taper -Given handout -For continued symptoms will place referral. -Continue supportive care including stretching and ROM exercises - Plan: predniSONE (DELTASONE) 10 MG tablet  Musculoskeletal disorder involving upper trapezius muscle -Discussed supportive care including stretching, massage, heat, topical analgesics -Given handout - Plan: predniSONE (DELTASONE) 10 MG tablet  F/u as needed in the next month  Leondra Cullin, MD 

## 2020-03-26 NOTE — Patient Instructions (Signed)
Seborrheic Keratosis A seborrheic keratosis is a common, noncancerous (benign) skin growth. These growths are velvety, waxy, rough, tan, brown, or black spots that appear on the skin. These skin growths can be flat or raised, and scaly. What are the causes? The cause of this condition is not known. What increases the risk? You are more likely to develop this condition if you:  Have a family history of seborrheic keratosis.  Are 50 or older.  Are pregnant.  Have had estrogen replacement therapy. What are the signs or symptoms? Symptoms of this condition include growths on the face, chest, shoulders, back, or other areas. These growths:  Are usually painless, but may become irritated and itchy.  Can be yellow, brown, black, or other colors.  Are slightly raised or have a flat surface.  Are sometimes rough or wart-like in texture.  Are often velvety or waxy on the surface.  Are round or oval-shaped.  Often occur in groups, but may occur as a single growth. How is this diagnosed? This condition is diagnosed with a medical history and physical exam.  A sample of the growth may be tested (skin biopsy).  You may need to see a skin specialist (dermatologist). How is this treated? Treatment is not usually needed for this condition, unless the growths are irritated or bleed often.  You may also choose to have the growths removed if you do not like their appearance. ? Most commonly, these growths are treated with a procedure in which liquid nitrogen is applied to "freeze" off the growth (cryosurgery). ? They may also be burned off with electricity (electrocautery) or removed by scraping (curettage). Follow these instructions at home:  Watch your growth for any changes.  Keep all follow-up visits as told by your health care provider. This is important.  Do not scratch or pick at the growth or growths. This can cause them to become irritated or infected. Contact a health care  provider if:  You suddenly have many new growths.  Your growth bleeds, itches, or hurts.  Your growth suddenly becomes larger or changes color. Summary  A seborrheic keratosis is a common, noncancerous (benign) skin growth.  Treatment is not usually needed for this condition, unless the growths are irritated or bleed often.  Watch your growth for any changes.  Contact a health care provider if you suddenly have many new growths or your growth suddenly becomes larger or changes color.  Keep all follow-up visits as told by your health care provider. This is important. This information is not intended to replace advice given to you by your health care provider. Make sure you discuss any questions you have with your health care provider. Document Revised: 02/21/2018 Document Reviewed: 02/21/2018 Elsevier Patient Education  2020 Elsevier Inc.  Shoulder Impingement Syndrome  Shoulder impingement syndrome is a condition that causes pain when connective tissues (tendons) surrounding the shoulder joint become pinched. These tendons are part of the group of muscles and tissues that help to stabilize the shoulder (rotator cuff). Beneath the rotator cuff is a fluid-filled sac (bursa) that allows the muscles and tendons to glide smoothly. The bursa may become swollen or irritated (bursitis). Bursitis, swelling in the rotator cuff tendons, or both conditions can decrease how much space is under a bone in the shoulder joint (acromion), resulting in impingement. What are the causes? Shoulder impingement syndrome may be caused by bursitis or swelling of the rotator cuff tendons, which may result from:  Repetitive overhead arm movements.  Falling onto  the shoulder.  Weakness in the shoulder muscles. What increases the risk? You may be more likely to develop this condition if you:  Play sports that involve throwing, such as baseball.  Participate in sports such as tennis, volleyball, and  swimming.  Work as a Education administrator, Music therapist, or Pharmacologist. Some people are also more likely to develop impingement syndrome because of the shape of their acromion bone. What are the signs or symptoms? The main symptom of this condition is pain on the front or side of the shoulder. The pain may:  Get worse when lifting or raising the arm.  Get worse at night.  Wake you up from sleeping.  Feel sharp when the shoulder is moved and then fade to an ache. Other symptoms may include:  Tenderness.  Stiffness.  Inability to raise the arm above shoulder level or behind the body.  Weakness. How is this diagnosed? This condition may be diagnosed based on:  Your symptoms and medical history.  A physical exam.  Imaging tests, such as: ? X-rays. ? MRI. ? Ultrasound. How is this treated? This condition may be treated by:  Resting your shoulder and avoiding all activities that cause pain or put stress on the shoulder.  Icing your shoulder.  NSAIDs to help reduce pain and swelling.  One or more injections of medicines to numb the area and reduce inflammation.  Physical therapy.  Surgery. This may be needed if nonsurgical treatments have not helped. Surgery may involve repairing the rotator cuff, reshaping the acromion, or removing the bursa. Follow these instructions at home: Managing pain, stiffness, and swelling   If directed, put ice on the injured area. ? Put ice in a plastic bag. ? Place a towel between your skin and the bag. ? Leave the ice on for 20 minutes, 2-3 times a day. Activity  Rest and return to your normal activities as told by your health care provider. Ask your health care provider what activities are safe for you.  Do exercises as told by your health care provider. General instructions  Do not use any products that contain nicotine or tobacco, such as cigarettes, e-cigarettes, and chewing tobacco. These can delay healing. If you need help quitting, ask  your health care provider.  Ask your health care provider when it is safe for you to drive.  Take over-the-counter and prescription medicines only as told by your health care provider.  Keep all follow-up visits as told by your health care provider. This is important. How is this prevented?  Give your body time to rest between periods of activity.  Be safe and responsible while being active. This will help you avoid falls.  Maintain physical fitness, including strength and flexibility. Contact a health care provider if:  Your symptoms have not improved after 1-2 months of treatment and rest.  You cannot lift your arm away from your body. Summary  Shoulder impingement syndrome is a condition that causes pain when connective tissues (tendons) surrounding the shoulder joint become pinched.  The main symptom of this condition is pain on the front or side of the shoulder.  This condition is usually treated with rest, ice, and pain medicines as needed. This information is not intended to replace advice given to you by your health care provider. Make sure you discuss any questions you have with your health care provider. Document Revised: 01/31/2019 Document Reviewed: 04/03/2018 Elsevier Patient Education  2020 Elsevier Inc.  Cryoablation, Care After This sheet gives you information about  how to care for yourself after your procedure. Your health care provider may also give you more specific instructions. If you have problems or questions, contact your health care provider. What can I expect after the procedure? After the procedure, it is common to have:  Soreness around the treatment area.  Mild pain and swelling in the treatment area. Follow these instructions at home: Treatment area care   Follow instructions from your health care provider about how to take care of your incision. Make sure you: ? Wash your hands with soap and water before you change your bandage (dressing). If  soap and water are not available, use hand sanitizer. ? Change your dressing as told by your health care provider. ? Leave stitches (sutures) in place. They may need to stay in place for 2 weeks or longer.  Check your treatment area every day for signs of infection. Check for: ? More redness, swelling, or pain. ? More fluid or blood. ? Warmth. ? Pus or a bad smell.  Keep the treated area clean, dry, and covered with a dressing until it has healed. Clean the area with soap and water or as told by your health care provider.  You may shower if your health care provider approves. If your bandage gets wet, change it right away. Activity  Follow instructions from your health care provider about any activity limitations.  Do not drive for 24 hours if you received a medicine to help you relax (sedative). General instructions  Take over-the-counter and prescription medicines only as told by your health care provider.  Keep all follow-up visits as told by your health care provider. This is important. Contact a health care provider if:  You do not have a bowel movement for 2 days.  You have nausea or vomiting.  You have more redness, swelling, or pain around your treatment area.  You have more fluid or blood coming from your treatment area.  Your treatment area feels warm to the touch.  You have pus or a bad smell coming from your treatment area.  You have a fever. Get help right away if:  You have severe pain.  You have trouble swallowing or breathing.  You have severe weakness or dizziness.  You have chest pain or shortness of breath. This information is not intended to replace advice given to you by your health care provider. Make sure you discuss any questions you have with your health care provider. Document Revised: 09/21/2017 Document Reviewed: 03/08/2016 Elsevier Patient Education  [place].

## 2021-04-01 ENCOUNTER — Telehealth (INDEPENDENT_AMBULATORY_CARE_PROVIDER_SITE_OTHER): Payer: 59 | Admitting: Family Medicine

## 2021-04-01 ENCOUNTER — Encounter: Payer: Self-pay | Admitting: Family Medicine

## 2021-04-01 DIAGNOSIS — U071 COVID-19: Secondary | ICD-10-CM

## 2021-04-01 NOTE — Progress Notes (Signed)
Virtual Visit via Video Note  I connected with Gary Hayes on 04/01/21 at  1:00 PM EDT by a video enabled telemedicine application 2/2 COVID-19 pandemic and verified that I am speaking with the correct person using two identifiers.  Location patient: home Location provider:work or home office Persons participating in the virtual visit: patient, provider  I discussed the limitations of evaluation and management by telemedicine and the availability of in person appointments. The patient expressed understanding and agreed to proceed.   HPI: Pt tested positive for COVID on wed with results coming in today.  Pt developed "allergy symptoms" over the weekend including itchy watery eyes sneezing, congestion, HA, sore throat.  Symptoms seem to increase on Wednesday 6/8, prompting him to go to urgent care for a COVID test.  The results of the COVID test came back positive today.  Pt denies fever, cough, n/v, sick contacts.  Pt taking sudefed DM, ipratropium nasal spray.  Patient had a J&J COVID-vaccine.  ROS: See pertinent positives and negatives per HPI.  Past Medical History:  Diagnosis Date   Chicken pox    Frequent headaches    GERD (gastroesophageal reflux disease)    Hay fever     No past surgical history on file.  Family History  Problem Relation Age of Onset   Diabetes Mother    Cancer Father    Heart disease Paternal Grandmother       Current Outpatient Medications:    predniSONE (DELTASONE) 10 MG tablet, Take 4 tabs every morning for 3 days, 3 tabs for 2 days, 2 tabs for 2 days, 1 tab for 1 day., Disp: 23 tablet, Rfl: 0  EXAM:  VITALS per patient if applicable: RR between 12-20 bpm  GENERAL: alert, oriented, appears well and in no acute distress  HEENT: atraumatic, conjunctiva clear, no obvious abnormalities on inspection of external nose and ears  NECK: normal movements of the head and neck  LUNGS: on inspection no signs of respiratory distress, breathing rate  appears normal, no obvious gross SOB, gasping or wheezing  CV: no obvious cyanosis  MS: moves all visible extremities without noticeable abnormality  PSYCH/NEURO: pleasant and cooperative, no obvious depression or anxiety, speech and thought processing grossly intact  ASSESSMENT AND PLAN:  Discussed the following assessment and plan:  COVID-19 virus infection -Positive COVID test on 03/30/2021 -Discussed supportive care including gargling with warm salt water or Chloraseptic spray, rest, hydration, OTC cough/cold medications -Given duration of symptoms and mild severity of symptoms antiviral medication not indicated. -Given precautions  Follow-up as needed    I discussed the assessment and treatment plan with the patient. The patient was provided an opportunity to ask questions and all were answered. The patient agreed with the plan and demonstrated an understanding of the instructions.   The patient was advised to call back or seek an in-person evaluation if the symptoms worsen or if the condition fails to improve as anticipated.  Deeann Saint, MD

## 2021-09-05 ENCOUNTER — Ambulatory Visit (INDEPENDENT_AMBULATORY_CARE_PROVIDER_SITE_OTHER): Payer: 59 | Admitting: Family Medicine

## 2021-09-05 VITALS — BP 112/82 | HR 84 | Temp 97.7°F | Wt 181.4 lb

## 2021-09-05 DIAGNOSIS — Z8739 Personal history of other diseases of the musculoskeletal system and connective tissue: Secondary | ICD-10-CM | POA: Diagnosis not present

## 2021-09-05 DIAGNOSIS — G8929 Other chronic pain: Secondary | ICD-10-CM

## 2021-09-05 DIAGNOSIS — M7522 Bicipital tendinitis, left shoulder: Secondary | ICD-10-CM

## 2021-09-05 DIAGNOSIS — M25511 Pain in right shoulder: Secondary | ICD-10-CM

## 2021-09-05 DIAGNOSIS — M25512 Pain in left shoulder: Secondary | ICD-10-CM

## 2021-09-05 MED ORDER — MELOXICAM 7.5 MG PO TABS: 7.5000 mg | ORAL_TABLET | Freq: Every day | ORAL | 0 refills | Status: DC

## 2021-09-05 NOTE — Progress Notes (Signed)
Subjective:    Patient ID: Gary Hayes, male    DOB: August 29, 1976, 45 y.o.   MRN: [id number]  Chief Complaint  Patient presents with   Shoulder Pain    Been having the pain, now it is bad and wants to do something about it    HPI Patient was seen today for ongoing concern.  Patient endorses continued b/l shoulder pain off and on x 20 + yrs.  Becoming worse over the last week, L>R.  Pt denies recent injury, heavy lifting, pushing or pulling.  Tried OTC meds like ibuprofen, ice, rest, physical therapy, improved posture, course of prednisone.  At time of initial injury years ago patient recalls shoulder separation.  Pain in left shoulder currently described as sharp with movement.  Recently had shooting pain going down left arm. At times has a "clunk" in L shoulder/joint.  Denies edema, pops, clicks, tears.  Sits at a desk at work.    Past Medical History:  Diagnosis Date   Chicken pox    Frequent headaches    GERD (gastroesophageal reflux disease)    Hay fever     No Known Allergies  ROS General: Denies fever, chills, night sweats, changes in weight, changes in appetite HEENT: Denies headaches, ear pain, changes in vision, rhinorrhea, sore throat CV: Denies CP, palpitations, SOB, orthopnea Pulm: Denies SOB, cough, wheezing GI: Denies abdominal pain, nausea, vomiting, diarrhea, constipation GU: Denies dysuria, hematuria, frequency Msk: Denies muscle cramps, joint pains +B/l shoulder pain Neuro: Denies weakness, numbness, tingling Skin: Denies rashes, bruising Psych: Denies depression, anxiety, hallucinations  Objective:    Blood pressure 112/82, pulse 84, temperature 97.7 F (36.5 C), temperature source Oral, weight 181 lb 6.4 oz (82.3 kg), SpO2 97 %.  Gen. Pleasant, well-nourished, in no distress, normal affect   HEENT: [place]/AT, face symmetric, conjunctiva clear, no scleral icterus, PERRLA, EOMI, nares patent without drainage Lungs: no accessory muscle use Cardiovascular:  RRR, no peripheral edema Musculoskeletal: No TTP of b/l shoulders.  FROM active and passive in b/l UEs.  Negative Hawkins, NEERs, or empty can.  Apprehension test negative, but causes discomfort in anterior shoulder at L biceps brachii long head tendon.  No deformities, no cyanosis or clubbing, normal tone Neuro:  A&Ox3, CN II-XII intact, normal gait Skin:  Warm, no lesions/ rash   Wt Readings from Last 3 Encounters:  09/05/21 181 lb 6.4 oz (82.3 kg)  03/26/20 177 lb (80.3 kg)  03/18/19 191 lb (86.6 kg)    No results found for: WBC, HGB, HCT, PLT, GLUCOSE, CHOL, TRIG, HDL, LDLDIRECT, LDLCALC, ALT, AST, NA, K, CL, CREATININE, BUN, CO2, TSH, PSA, INR, GLUF, HGBA1C, MICROALBUR  Assessment/Plan:  Tendinitis of long head of biceps brachii of left shoulder - Plan: Ambulatory referral to Orthopedic Surgery  Chronic left shoulder pain - Plan: Ambulatory referral to Orthopedic Surgery, meloxicam (MOBIC) 7.5 MG tablet  Chronic right shoulder pain - Plan: Ambulatory referral to Orthopedic Surgery, meloxicam (MOBIC) 7.5 MG tablet  History of closed shoulder dislocation - Plan: Ambulatory referral to Orthopedic Surgery  Given history of shoulder pain discussed imaging such as u/s or MRI as appears more msk cause rather than the bone itself.  Discussed possible tear in long head biceps brachii.  Also consider biceps brachii injury with SLAP injury.  Discussed concern for shoulder girdle strain given patient's description of having a "clunk" sensation in L shoulder after h/o prior shoulder dislocation.  Ibuprofen or NSAIDs as needed for pain/inflammation.  Rx for Mobic sent to  pharmacy.  Continue heat and topical analgesics.  Referral placed to Ortho.  Discussed given precautions.  Given handouts.  F/u prn  Abbe Amsterdam, MD

## 2021-09-07 ENCOUNTER — Other Ambulatory Visit (HOSPITAL_BASED_OUTPATIENT_CLINIC_OR_DEPARTMENT_OTHER): Payer: Self-pay | Admitting: Orthopaedic Surgery

## 2021-09-07 DIAGNOSIS — M25511 Pain in right shoulder: Secondary | ICD-10-CM

## 2021-09-08 ENCOUNTER — Other Ambulatory Visit: Payer: Self-pay

## 2021-09-08 ENCOUNTER — Ambulatory Visit (INDEPENDENT_AMBULATORY_CARE_PROVIDER_SITE_OTHER): Payer: 59 | Admitting: Orthopaedic Surgery

## 2021-09-08 ENCOUNTER — Ambulatory Visit (HOSPITAL_BASED_OUTPATIENT_CLINIC_OR_DEPARTMENT_OTHER)
Admission: RE | Admit: 2021-09-08 | Discharge: 2021-09-08 | Disposition: A | Discharge status: Home / Self Care | Payer: 59 | Source: Ambulatory Visit | Attending: Orthopaedic Surgery | Admitting: Orthopaedic Surgery

## 2021-09-08 DIAGNOSIS — M24812 Other specific joint derangements of left shoulder, not elsewhere classified: Secondary | ICD-10-CM

## 2021-09-08 DIAGNOSIS — M25512 Pain in left shoulder: Secondary | ICD-10-CM | POA: Diagnosis present

## 2021-09-08 DIAGNOSIS — M24811 Other specific joint derangements of right shoulder, not elsewhere classified: Secondary | ICD-10-CM | POA: Diagnosis not present

## 2021-09-08 DIAGNOSIS — M25511 Pain in right shoulder: Secondary | ICD-10-CM | POA: Diagnosis present

## 2021-09-08 IMAGING — DX DG SHOULDER 2+V*L*
3 series · 3 of 3 positions shown · non-contrast
Comparison: None.

CLINICAL DATA: Left shoulder pain for 2 decades without focal
injury, initial encounter

EXAM:
LEFT SHOULDER - 2+ VIEW

[shoulder grashey]
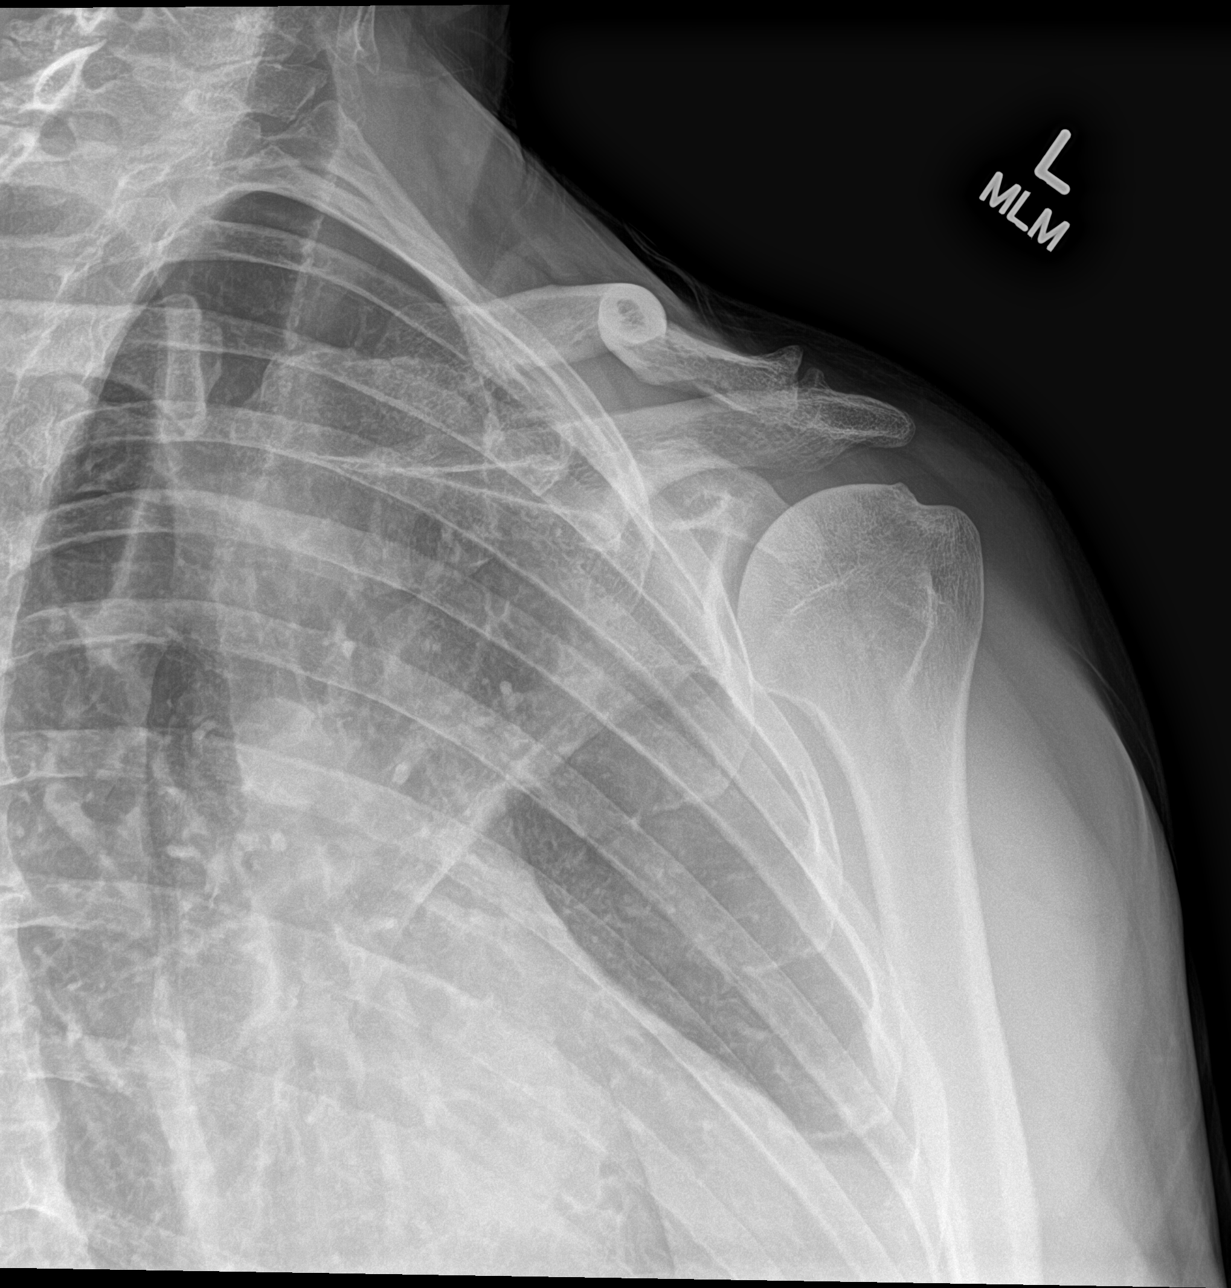

[shoulder y view]
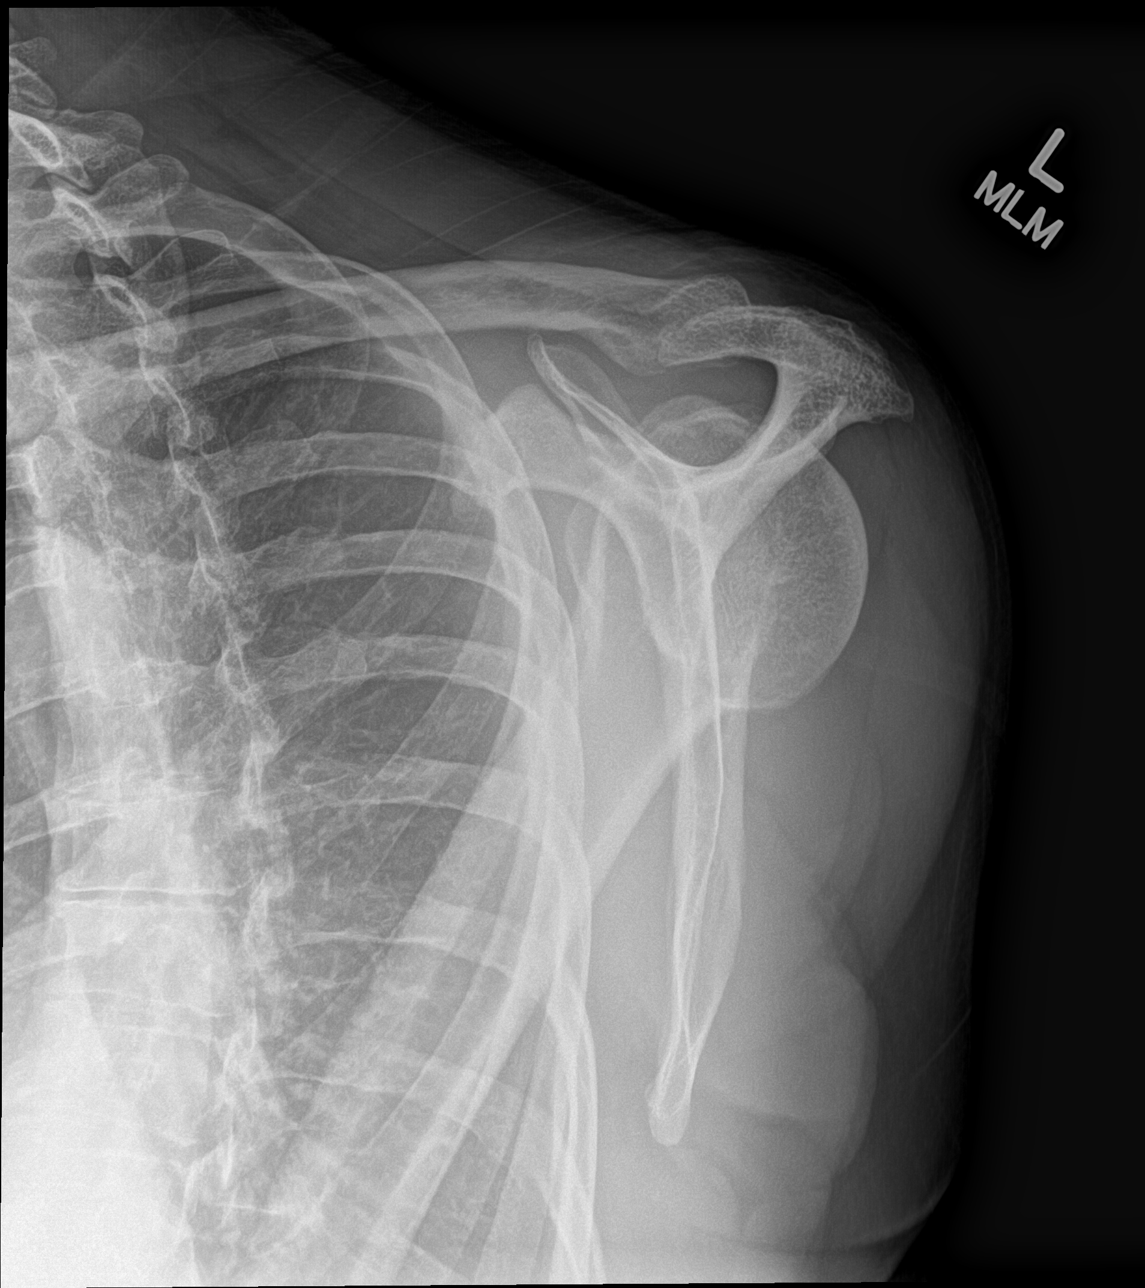

[shoulder axillary]
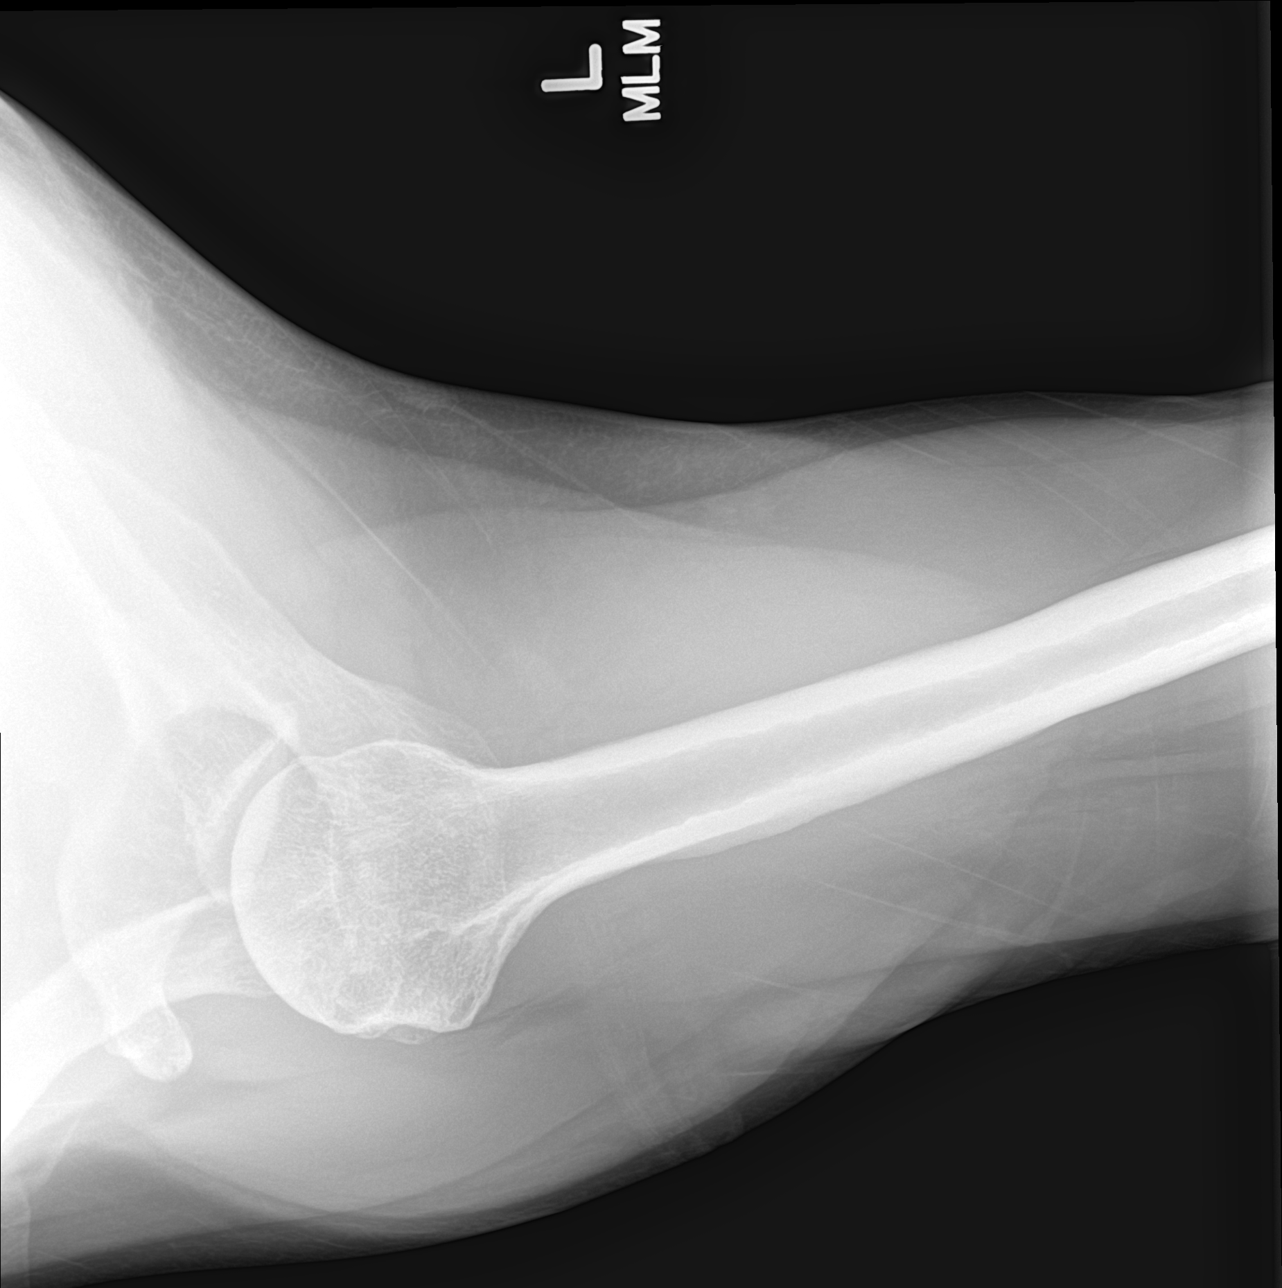

[3 of 3 positions shown; findings below may reference images not displayed]

FINDINGS: Degenerative changes of the acromioclavicular joint are seen. Mild
glenohumeral degenerative changes are seen as well. No fracture or
dislocation is seen. Ribcage and soft tissues are within normal
limits.
IMPRESSION: Degenerative change without acute abnormality.

## 2021-09-08 IMAGING — DX DG SHOULDER 2+V*R*
3 series · 3 of 3 positions shown · non-contrast
Comparison: None.

CLINICAL DATA: Bilateral shoulder pain for 2 decades, initial
encounter

EXAM:
RIGHT SHOULDER - 2+ VIEW

[shoulder grashey]
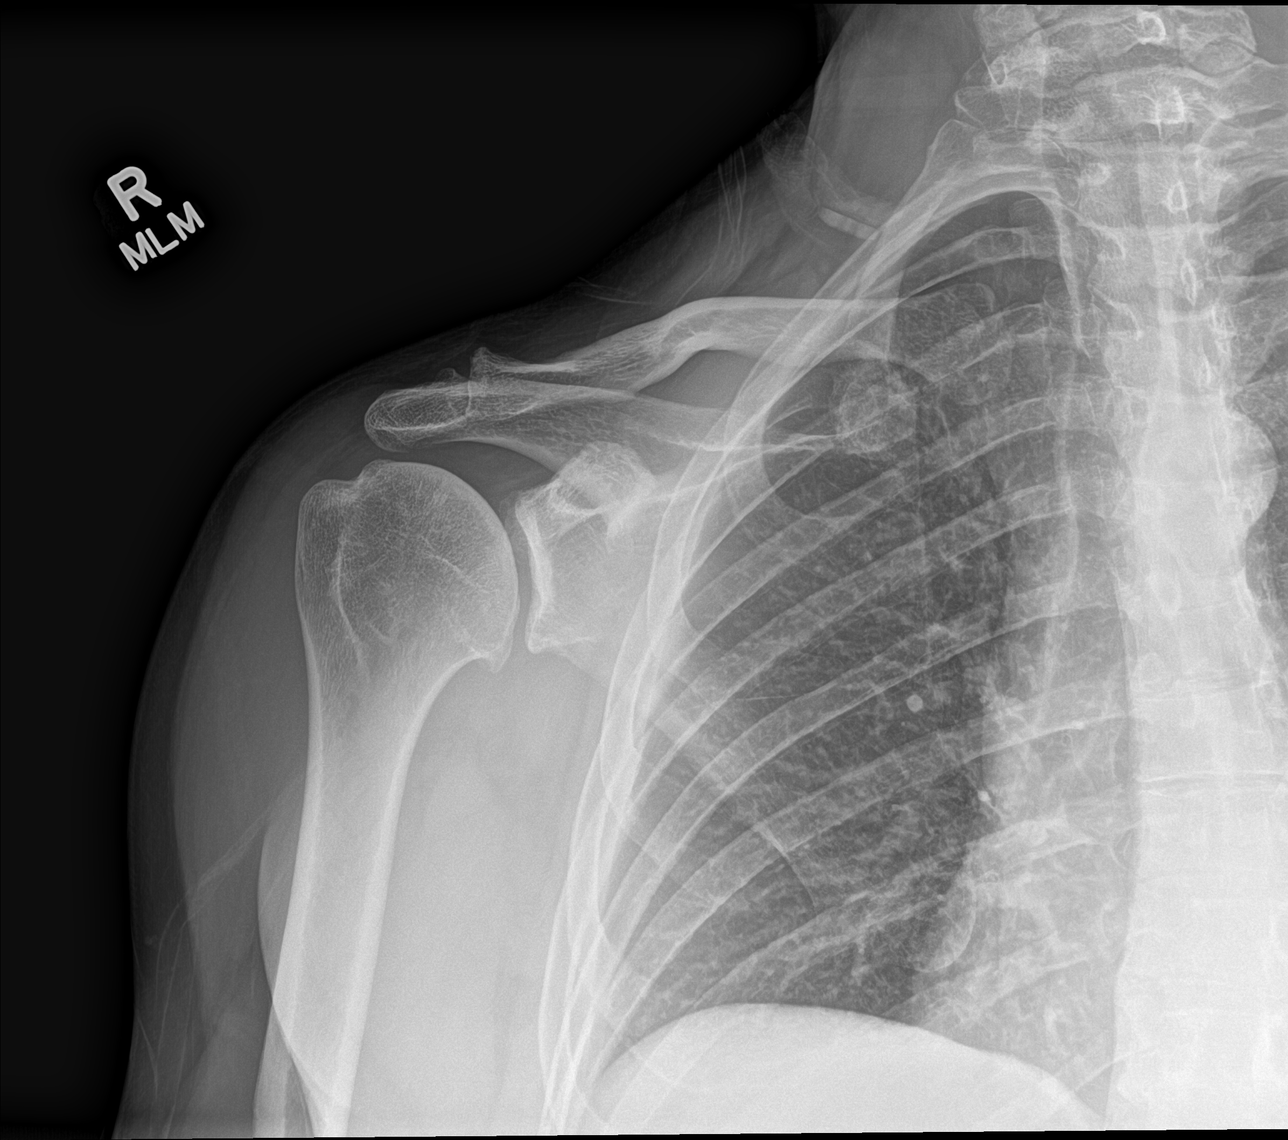

[shoulder y view]
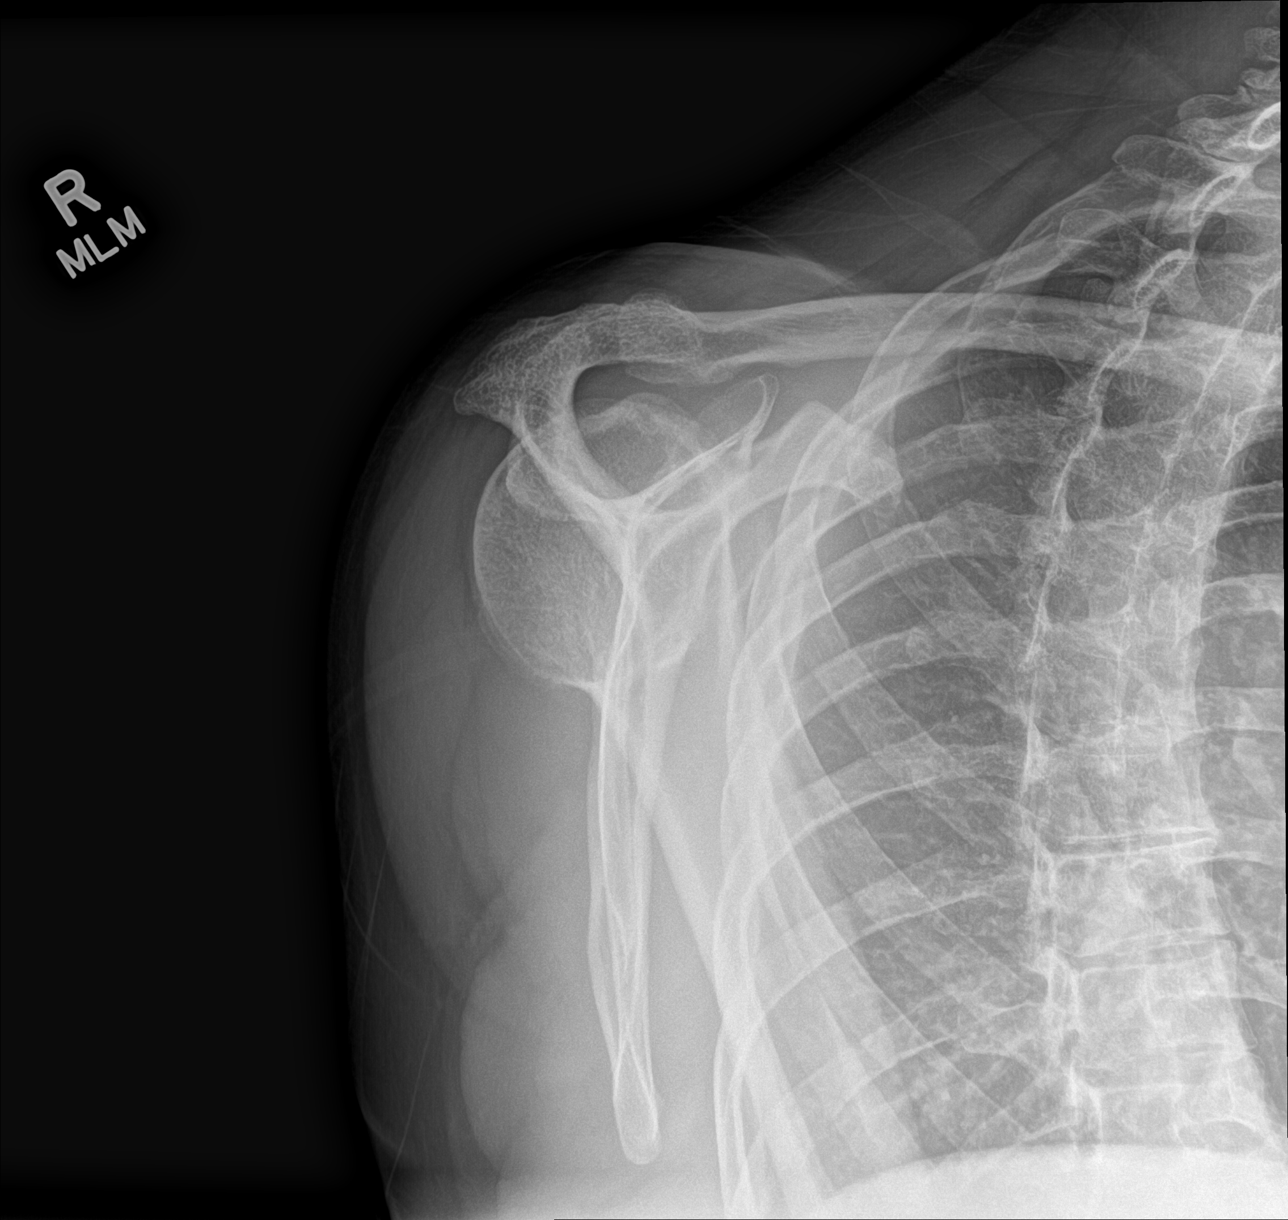

[shoulder axillary]
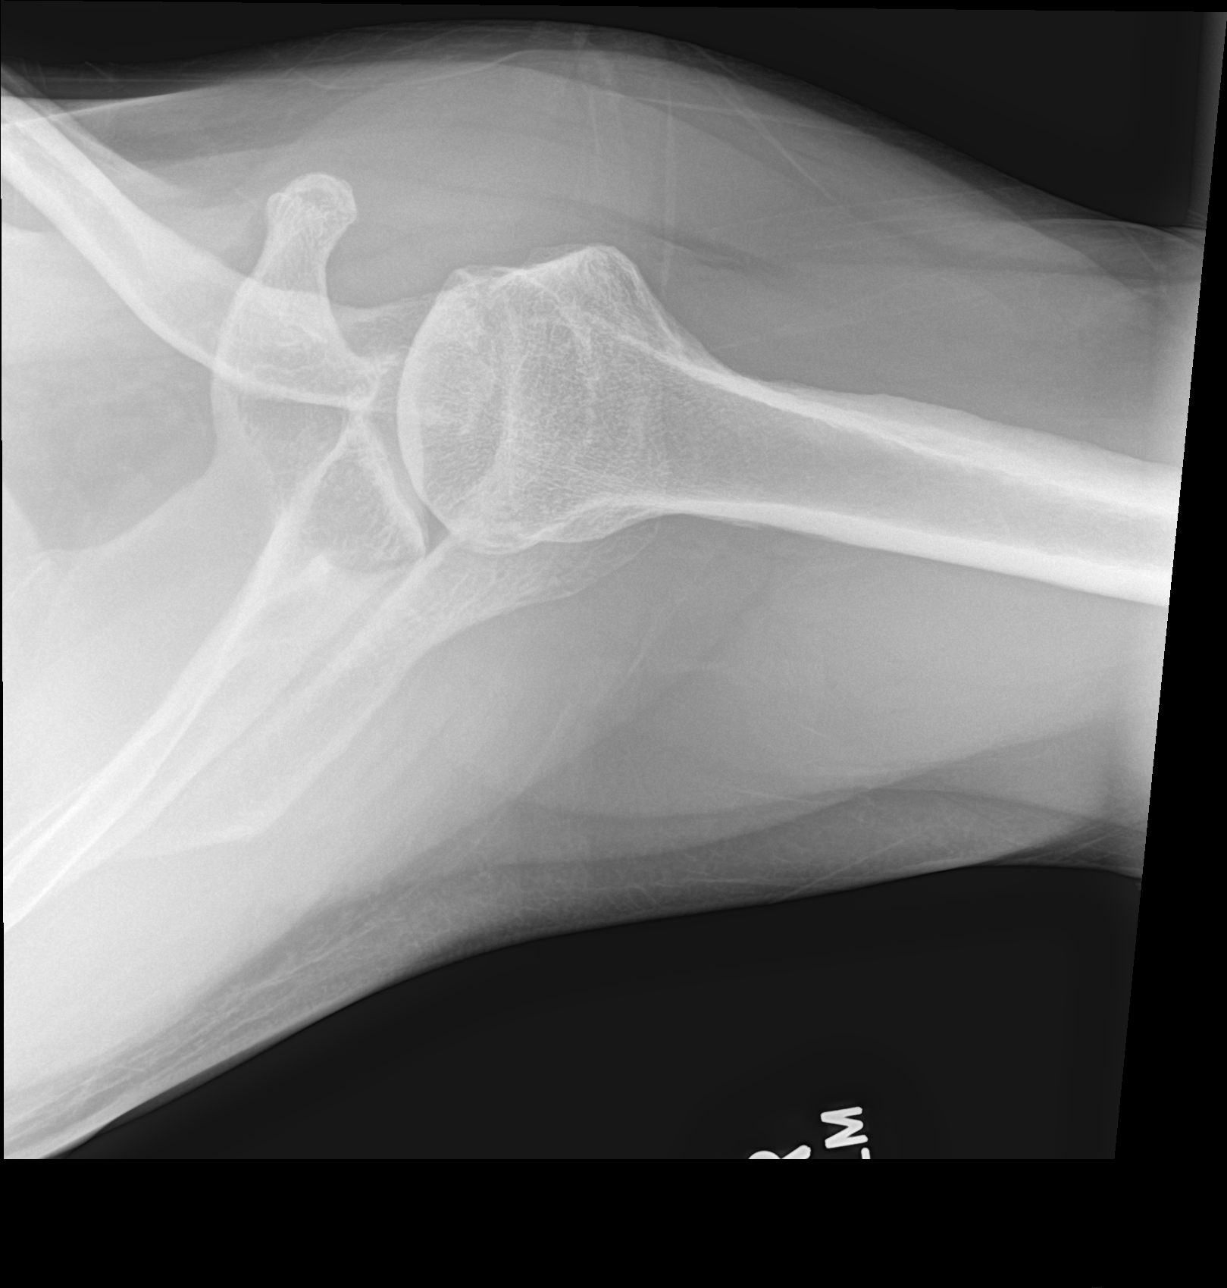

[3 of 3 positions shown; findings below may reference images not displayed]

FINDINGS: Degenerative changes of the acromioclavicular joint are seen. No
acute fracture or dislocation is noted. Glenohumeral degenerative
changes are seen as well. Underlying bony thorax appears within
normal limits. No soft tissue abnormality is seen.
IMPRESSION: Degenerative change without acute abnormality

## 2021-09-08 NOTE — Progress Notes (Signed)
Chief Complaint: Left wrist and right shoulder pain     History of Present Illness:   Gary Hayes is a 45 y.o. male right-hand-dominant who presents with bilateral shoulder pain now going on for several years.  He did have a left anterior shoulder dislocation many years prior which subsequently was treated with conservative management.  He states that he has been doing physical therapy for multiple years on both of his shoulders.  He is able to get them to strengthen to a certain point but only is able to get so much relief from this.  He does continue to have recurrent episodes of feeling like the left shoulder is giving out.  He takes ibuprofen as needed for flareups.  He works as an Acupuncturist.  He has previously tried a dose of oral steroids which only helps somewhat.  He states the left shoulder pain has been more bothersome over the last several weeks.  He has a 59-year-old and 47 year old that he would like to stay active and play sports with.  He previously did martial arts but subsequently had very significant difficulty with this particularly with punches in the left arm as this would feel like the shoulder will give out.    Surgical History:   None  PMH/PSH/Family History/Social History/Meds/Allergies:    Past Medical History:  Diagnosis Date   Chicken pox    Frequent headaches    GERD (gastroesophageal reflux disease)    Hay fever    No past surgical history on file. Social History   Socioeconomic History   Marital status: Married    Spouse name: Not on file   Number of children: Not on file   Years of education: Not on file   Highest education level: Bachelor's degree (e.g., BA, AB, BS)  Occupational History   Not on file  Tobacco Use   Smoking status: Never   Smokeless tobacco: Never  Substance and Sexual Activity   Alcohol use: Never    Alcohol/week: 0.0 standard drinks   Drug use: Never   Sexual activity: Not on  file  Other Topics Concern   Not on file  Social History Narrative   Not on file   Social Determinants of Health   Financial Resource Strain: Low Risk    Difficulty of Paying Living Expenses: Not hard at all  Food Insecurity: No Food Insecurity   Worried About Programme researcher, broadcasting/film/video in the Last Year: Never true   Ran Out of Food in the Last Year: Never true  Transportation Needs: No Transportation Needs   Lack of Transportation (Medical): No   Lack of Transportation (Non-Medical): No  Physical Activity: Insufficiently Active   Days of Exercise per Week: 2 days   Minutes of Exercise per Session: 30 min  Stress: No Stress Concern Present   Feeling of Stress : Only a little  Social Connections: Unknown   Frequency of Communication with Friends and Family: Patient refused   Frequency of Social Gatherings with Friends and Family: Patient refused   Attends Religious Services: Patient refused   Database administrator or Organizations: No   Attends Engineer, structural: Not on file   Marital Status: Married   Family History  Problem Relation Age of Onset   Diabetes Mother    Cancer Father  Heart disease Paternal Grandmother    No Known Allergies Current Outpatient Medications  Medication Sig Dispense Refill   meloxicam (MOBIC) 7.5 MG tablet Take 1 tablet (7.5 mg total) by mouth daily. 30 tablet 0   No current facility-administered medications for this visit.   No results found.  Review of Systems:   A ROS was performed including pertinent positives and negatives as documented in the HPI.  Physical Exam :   Constitutional: NAD and appears stated age Neurological: Alert and oriented Psych: Appropriate affect and cooperative There were no vitals taken for this visit.   Comprehensive Musculoskeletal Exam:    Musculoskeletal Exam    Inspection Right Left  Skin No atrophy or winging No atrophy or winging  Palpation    Tenderness Glenohumeral Glenohumeral  Range  of Motion    Flexion (passive) 170 170  Flexion (active) 170 170  Abduction 170 170  ER at the side 70 70  Can reach behind back to T12 T12  Strength     Full Full  Special Tests    Pseudoparalytic No No  Neurologic    Fires PIN, radial, median, ulnar, musculocutaneous, axillary, suprascapular, long thoracic, and spinal accessory innervated muscles. No abnormal sensibility  Vascular/Lymphatic    Radial Pulse 2+ 2+  Cervical Exam    Patient has symmetric cervical range of motion with negative Spurling's test.  Special Test: Right shoulder: Positive O'Brien, left shoulder: Positive anterior apprehension with a 2+ anterior load-and-shift and 1+ posterior load shift     Imaging:   Xray (3 views right shoulder, 3 views left shoulder): Mild AC joint arthritis bilaterally   I personally reviewed and interpreted the radiographs.   Assessment:   45 year old male with bilateral shoulder pain.  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  Given the fact that he has exhaustively trialed nonoperative therapy with physical therapy strengthening, NSAIDs, I do believe that the left shoulder and the right shoulder are indicated.  Is with contrast to further assess the labrum.  With regard to the right shoulder I do believe his exam is consistent with a superior labral tear as well.  Plan :    -Plan for MRI bilateral shoulders with contrast  I believe that advance imaging in the form of an MRI is indicated for the following reasons: -Xrays images were obtained and not diagnostic -The patient has failed treatment modalities including physical therapy -The following worrisome symptoms are present on history and exam: Apprehension on the right, positive obrien on the left     I personally saw and evaluated the patient, and participated in the management and treatment plan.  [name],  MD Attending Physician, Orthopedic Surgery  This document was dictated using Dragon voice recognition software. A reasonable attempt at proof reading has been made to minimize errors.

## 2021-09-19 ENCOUNTER — Other Ambulatory Visit (HOSPITAL_BASED_OUTPATIENT_CLINIC_OR_DEPARTMENT_OTHER): Payer: Self-pay | Admitting: Orthopaedic Surgery

## 2021-09-19 DIAGNOSIS — M25511 Pain in right shoulder: Secondary | ICD-10-CM

## 2021-09-30 ENCOUNTER — Ambulatory Visit (HOSPITAL_BASED_OUTPATIENT_CLINIC_OR_DEPARTMENT_OTHER): Payer: 59 | Admitting: Orthopaedic Surgery

## 2021-10-03 ENCOUNTER — Ambulatory Visit
Admission: RE | Admit: 2021-10-03 | Discharge: 2021-10-03 | Disposition: A | Discharge status: Home / Self Care | Payer: 59 | Source: Ambulatory Visit | Attending: Orthopaedic Surgery | Admitting: Orthopaedic Surgery

## 2021-10-03 ENCOUNTER — Other Ambulatory Visit: Payer: Self-pay

## 2021-10-03 DIAGNOSIS — M25512 Pain in left shoulder: Secondary | ICD-10-CM

## 2021-10-03 DIAGNOSIS — M25511 Pain in right shoulder: Secondary | ICD-10-CM

## 2021-10-03 IMAGING — XA DG FLUORO GUIDE NDL PLC/BX
2 series · 2 of 2 positions shown · non-contrast
Comparison: none

CLINICAL DATA: Bilat shoulder pain, X 20 years, no sx, no ca, prev
injury Separated shoulders and dislocations//[REDACTED]

EXAM:
EXAM
RIGHT SHOULDER INJECTION UNDER FLUOROSCOPY FOR MRI
FLUOROSCOPY TIME:  14 seconds; 15 [7E] DAP
TECHNIQUE: The procedure, risks (including but not limited to bleeding,
infection, organ damage ), benefits, and alternatives were explained
to the patient. Questions regarding the procedure were encouraged
and answered. The patient understands and consents to the procedure.

[Series 1: ortho standard · 1 of 1 slices shown (1 of 2)]
[im 1/1]
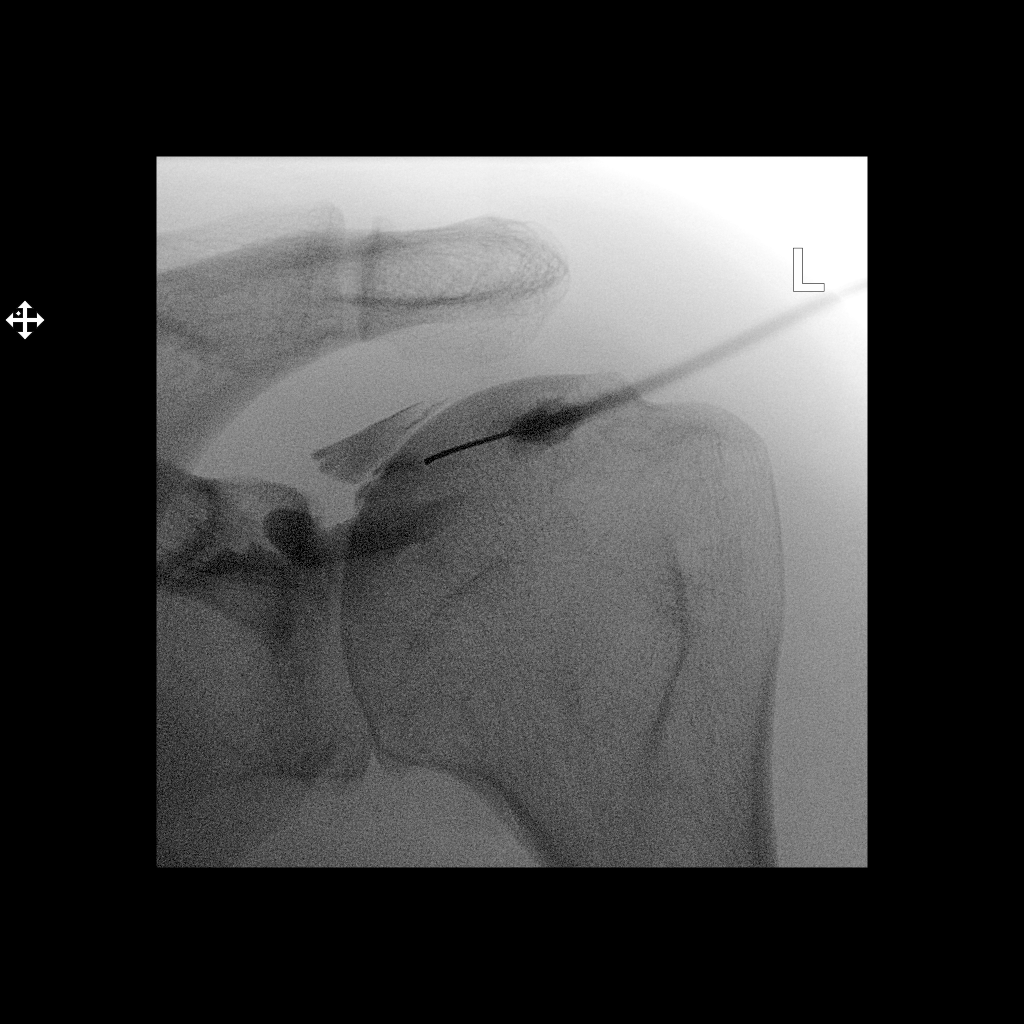

[Series 2: ortho standard · 1 of 1 slices shown (2 of 2)]
[im 1/1]
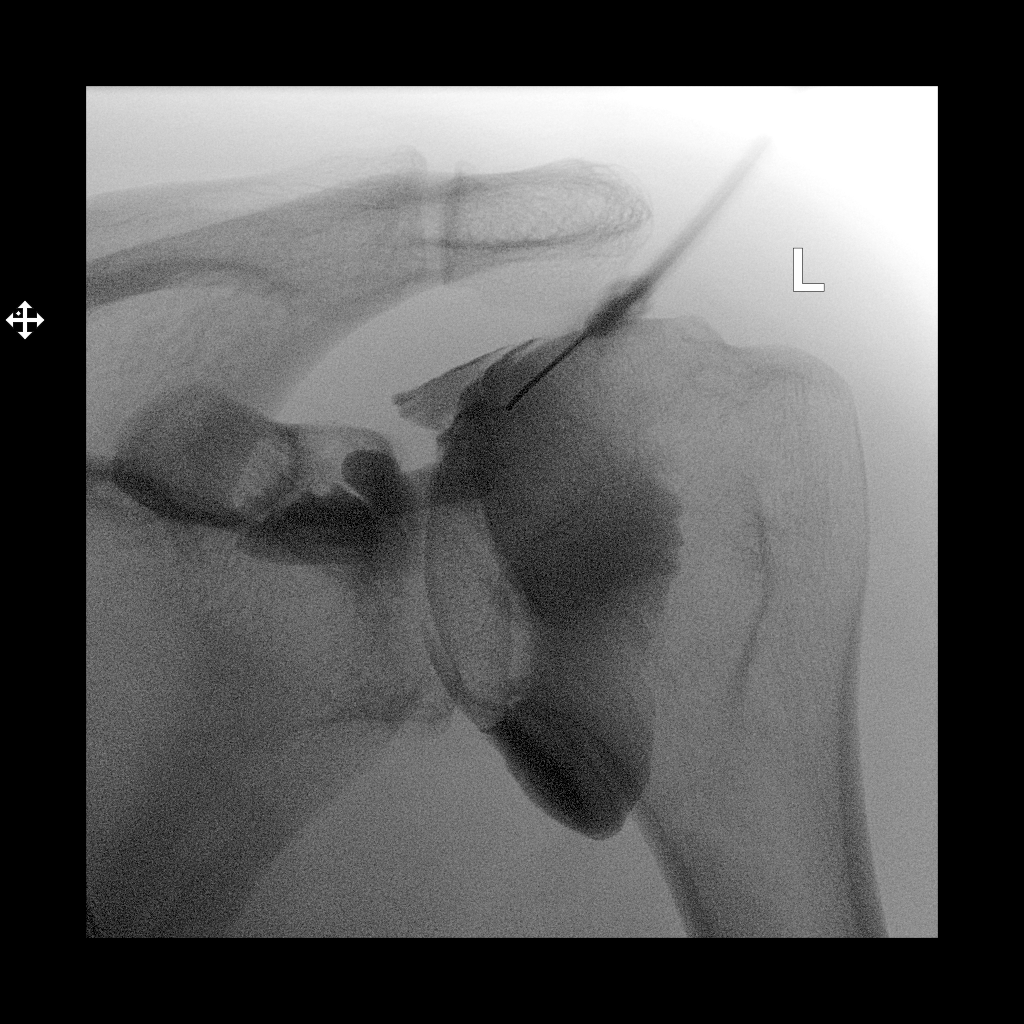

[2 of 2 positions shown; findings below may reference images not displayed]

An appropriate skin entry site was determined under fluoroscopy.
Skin site was marked, prepped with Betadine, and draped in usual
sterile fashion, and infiltrated locally with 1% lidocaine.

22-gauge spinal needle advanced to the superior medial margin of the
humeral head. 1 mL of lidocaine 1% injected easily. 13ml of a
mixture of 20 mL dilute iodinated contrast with 0.1ml Multihance
contrast was injected into the shoulder joint. Intraarticular flow
was confirmed on fluoroscopy. Patient transferred to MRI.

COMPLICATIONS:
COMPLICATIONS
none
IMPRESSION: 1. Technically successful right shoulder injection for MRI

## 2021-10-03 IMAGING — XA DG FLUORO GUIDE NDL PLC/BX
5 series · 8 of 8 positions shown · non-contrast
Comparison: none

CLINICAL DATA: History of bilateral shoulder injuries from
football, possible dislocations.

EXAM:
EXAM
LEFT SHOULDER INJECTION UNDER FLUOROSCOPY FOR MRI
FLUOROSCOPY TIME:  10 seconds; 5 [HF] DAP
TECHNIQUE: The procedure, risks (including but not limited to bleeding,
infection, organ damage ), benefits, and alternatives were explained
to the patient. Questions regarding the procedure were encouraged
and answered. The patient understands and consents to the procedure.

[Series 1: ortho standard · 1 of 1 slices shown (1 of 5)]
[im 1/1]
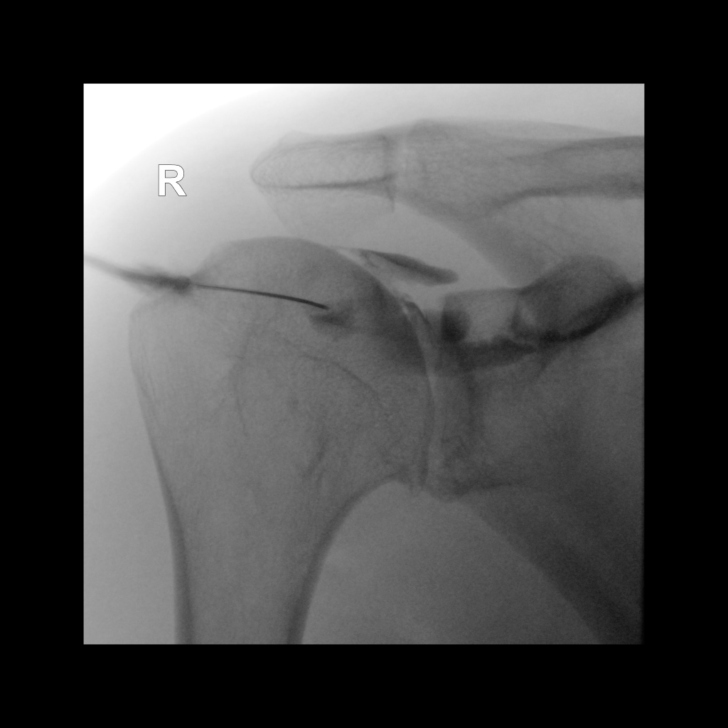

[Series 1: ortho standard · 1 of 1 slices shown (2 of 5)]
[im 1/1]
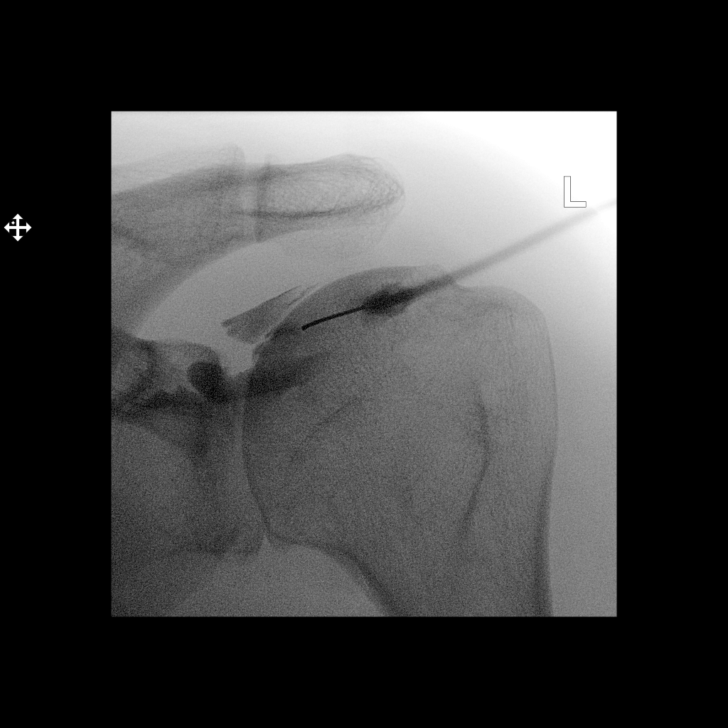

[Series 2: ortho standard · 1 of 1 slices shown (3 of 5)]
[im 1/1]
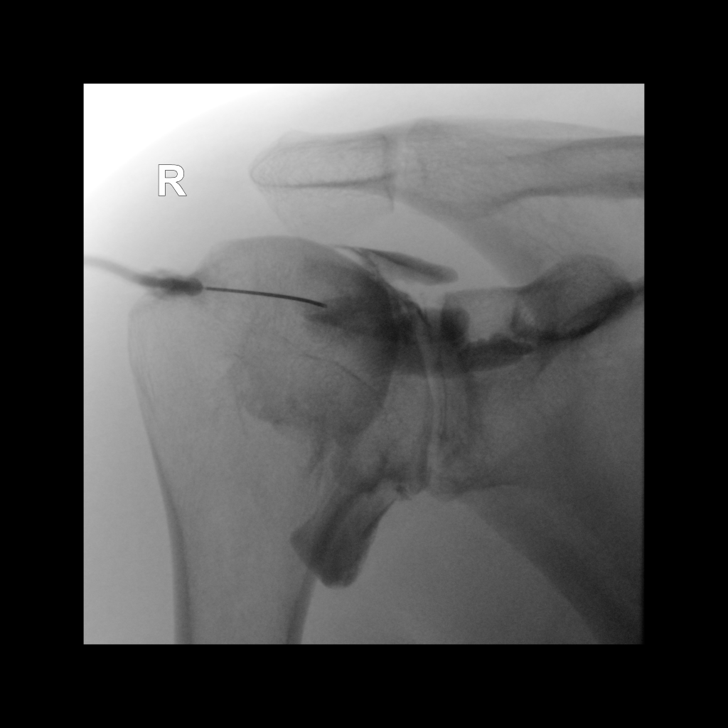

[Series 2: ortho standard · 1 of 1 slices shown (4 of 5)]
[im 1/1]
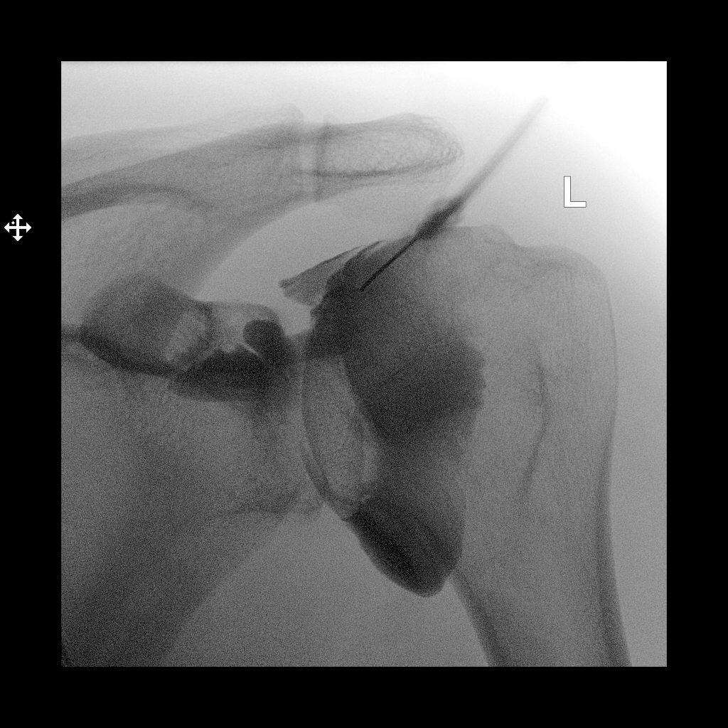

[Series 4: ortho standard · 2 acquisitions, 4 frames shown (5 of 5)]
[im 1/2]
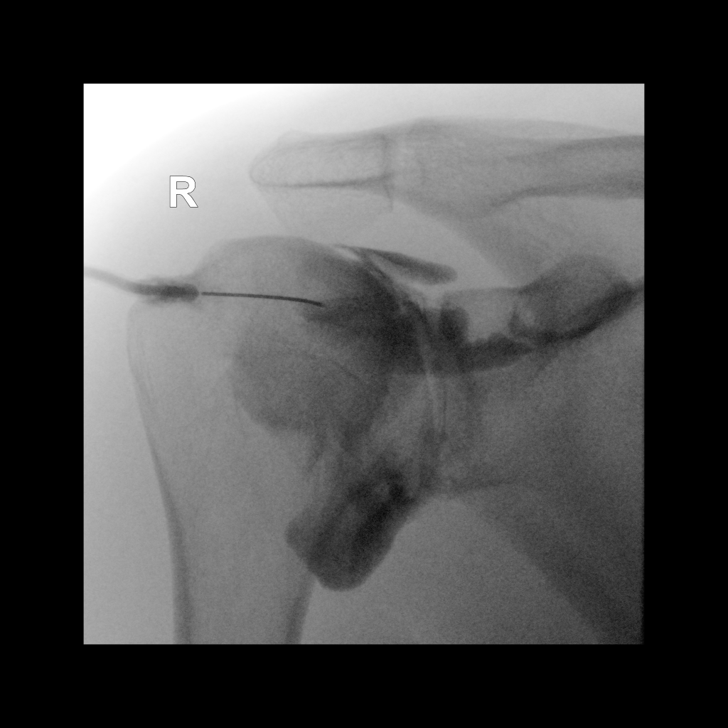
[im 1/2]
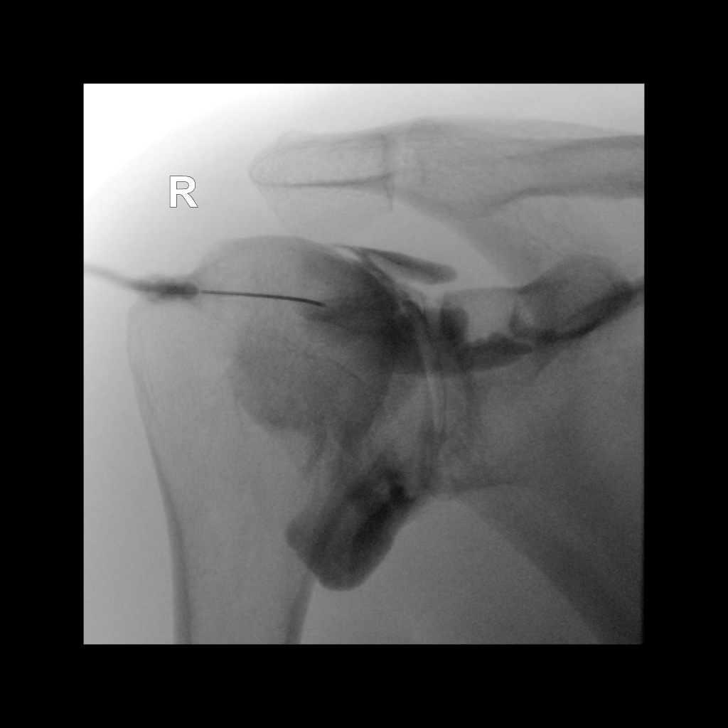
[im 1/2]
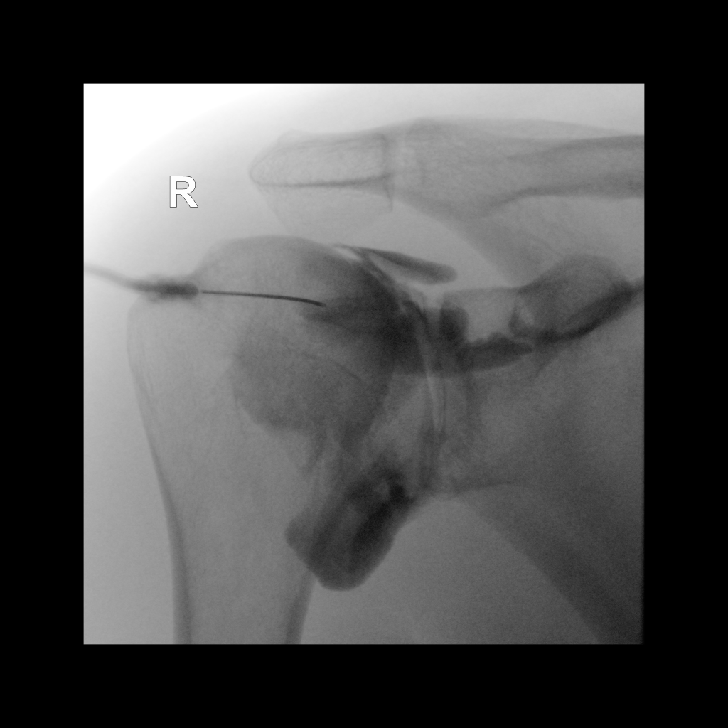
[im 2/2]
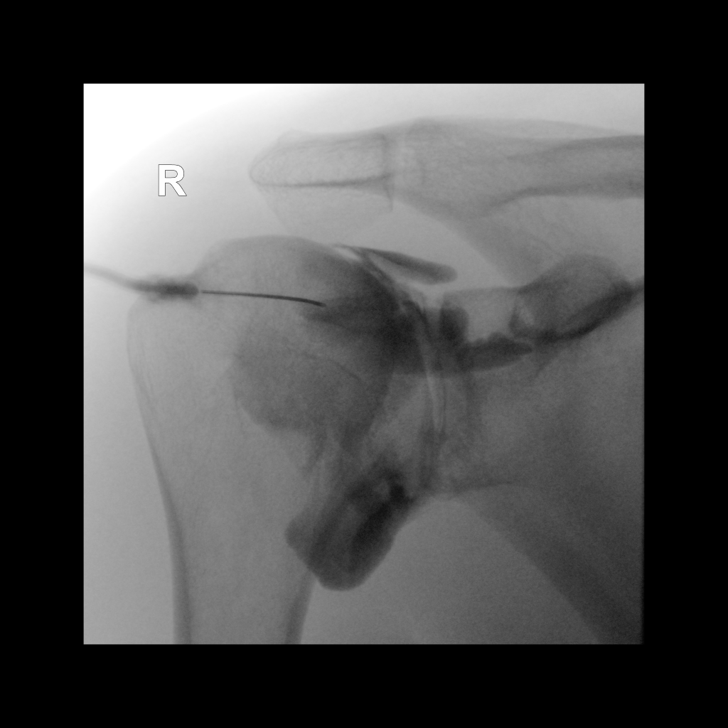

[8 of 8 positions shown; findings below may reference images not displayed]

An appropriate skin entry site was determined under fluoroscopy.
Skin site was marked, prepped with Betadine, and draped in usual
sterile fashion, and infiltrated locally with 1% lidocaine.

22-gauge spinal needle advanced to the superior medial margin of the
humeral head. 1 mL of lidocaine 1% injected easily. 12ml of a
mixture of 20 mL dilute iodinated contrast with 0.1ml Multihance
contrast was injected into the shoulder joint. Intraarticular flow
was confirmed on fluoroscopy. Patient transferred to MRI.

COMPLICATIONS:
COMPLICATIONS
none
IMPRESSION: 1. Technically successful left shoulder injection for MRI

## 2021-10-03 IMAGING — MR MR SHOULDER*R* W/CM
4 of 5 series · 13 of 40 positions shown · IV contrast (agent unspecified)
Comparison: X-ray shoulder [DATE].

CLINICAL DATA: Bilateral shoulder pain for 20 years. Assess
instability or dislocation.

EXAM:
MR ARTHROGRAM OF THE RIGHT SHOULDER
TECHNIQUE: Multiplanar, multisequence MR imaging of the right shoulder was
performed following the administration of intra-articular contrast.
CONTRAST:  See Injection Documentation.

[Series 7: T1 fat-sat · axial · right · 3.0mm · 0.36mm/px · z∈[-44,+16]mm · 3 of 25 slices shown (1 of 2)]
[im 4/25]
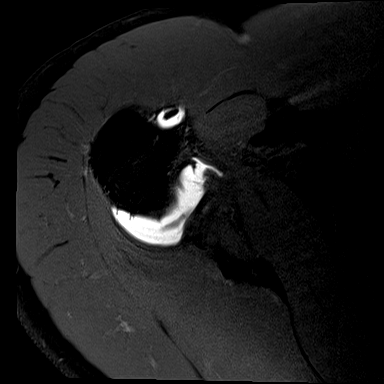
[im 14/25]
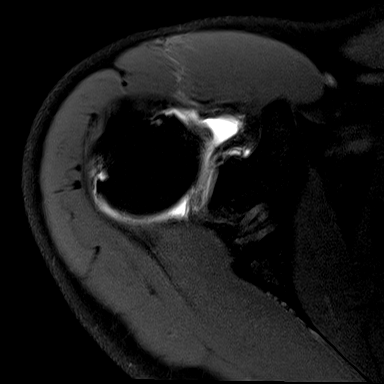
[im 21/25]
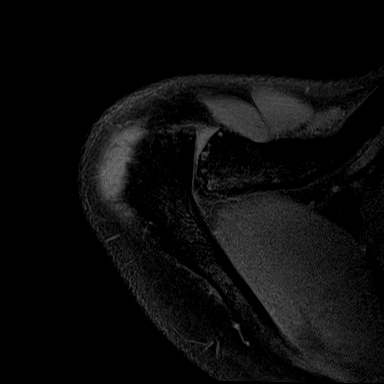

[Series 8: T2 fat-sat · oblique · right · 3.0mm · 0.22mm/px · 4 of 25 slices shown (1 of 2)]
[im 1/25]
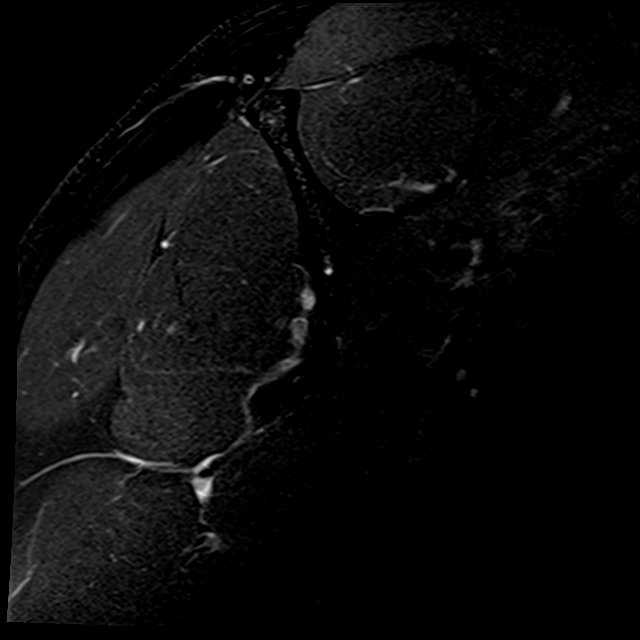
[im 4/25]
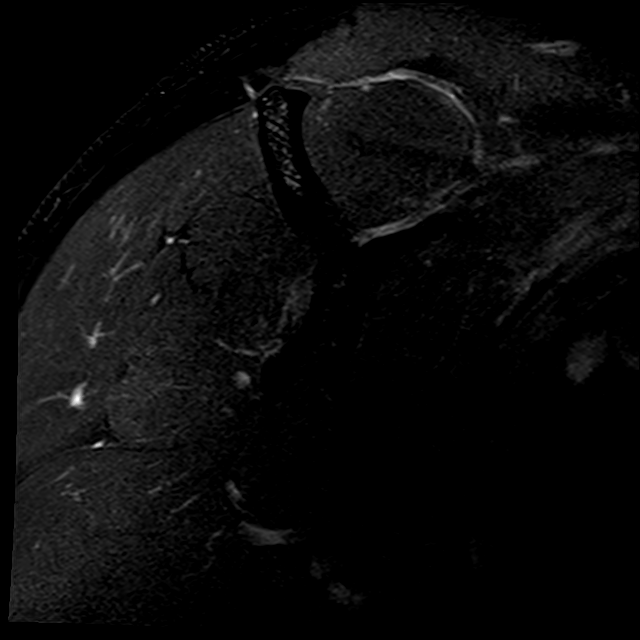
[im 14/25]
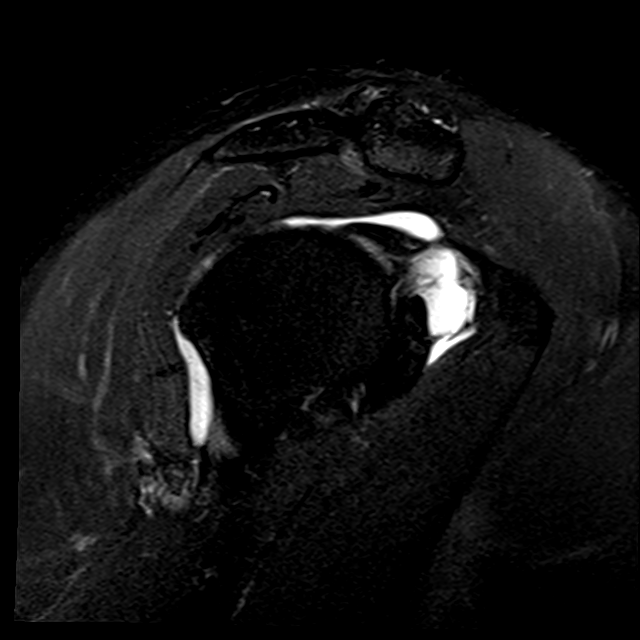
[im 21/25]
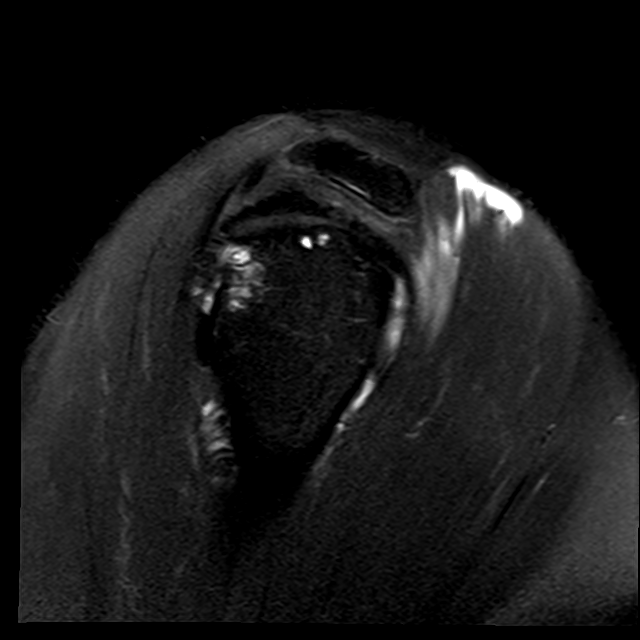

[Series 9: T1 fat-sat · oblique · right · 3.0mm · 0.18mm/px · 3 of 23 slices shown (2 of 2)]
[im 4/23]
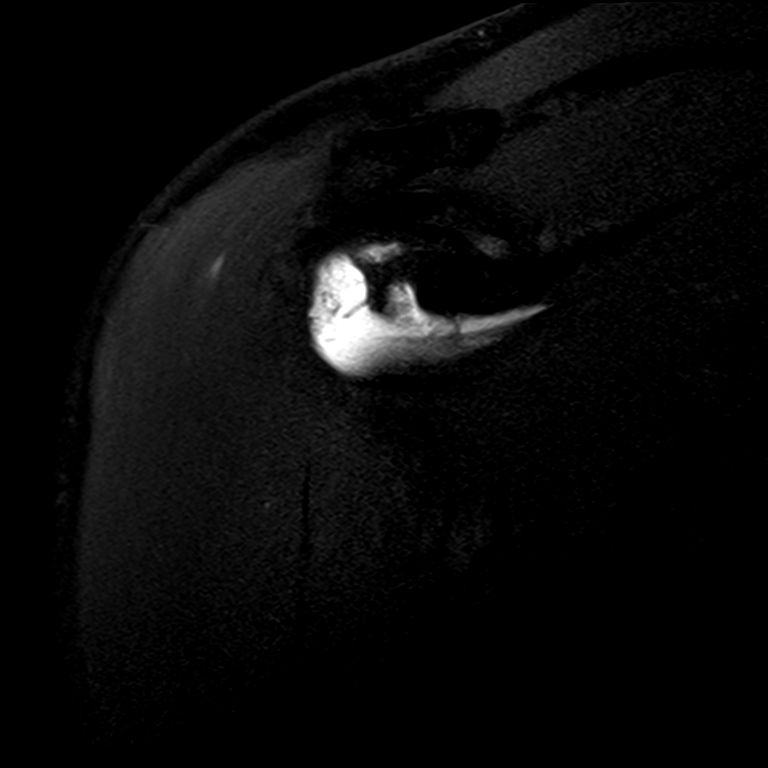
[im 13/23]
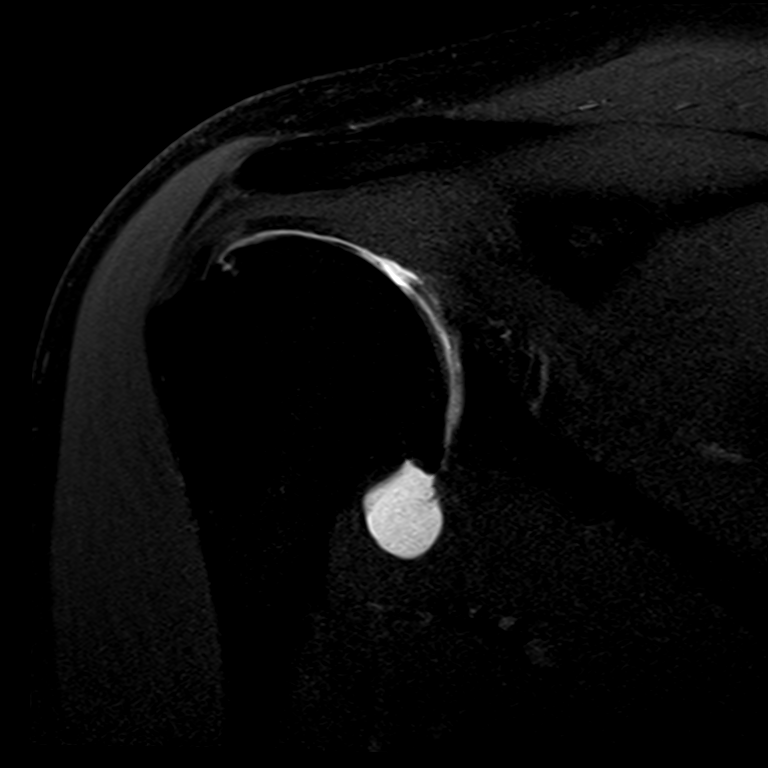
[im 19/23]
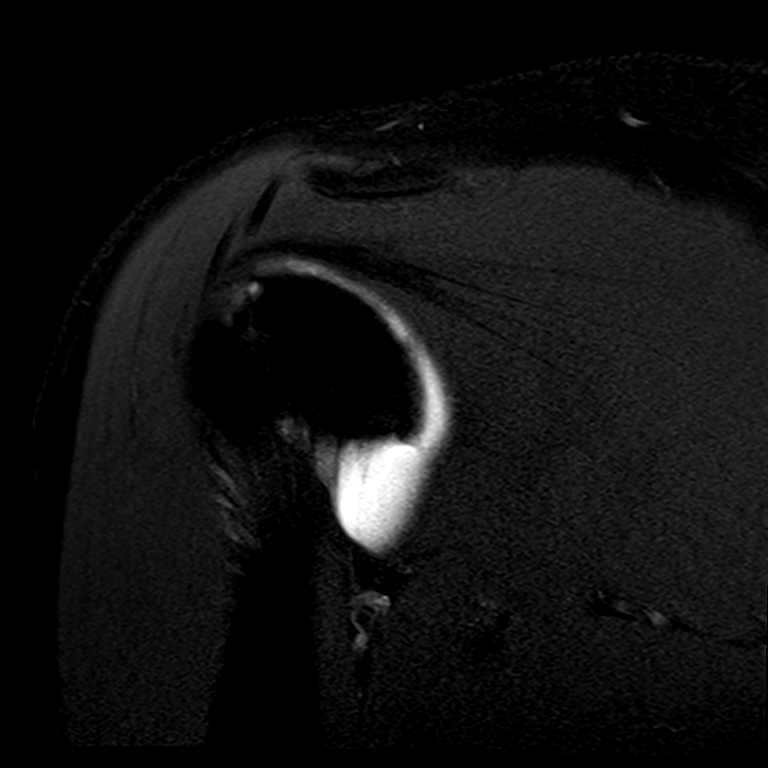

[Series 11: T2 fat-sat · oblique · right · 3.0mm · 0.22mm/px · 3 of 23 slices shown (2 of 2)]
[im 4/23]
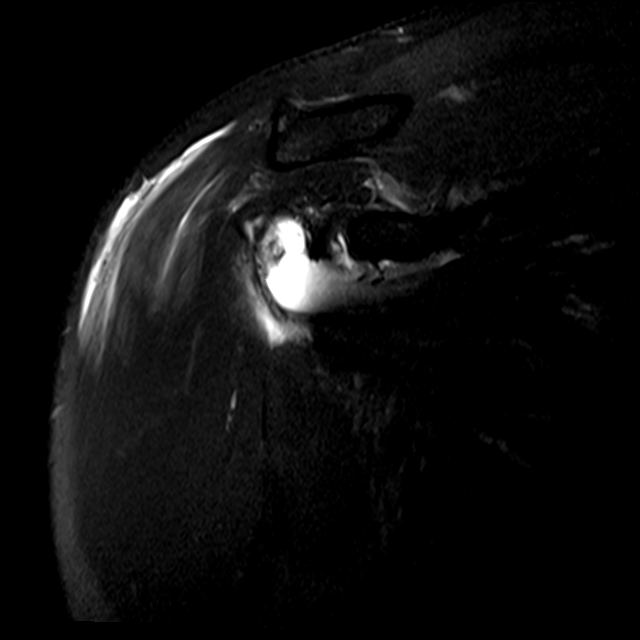
[im 13/23]
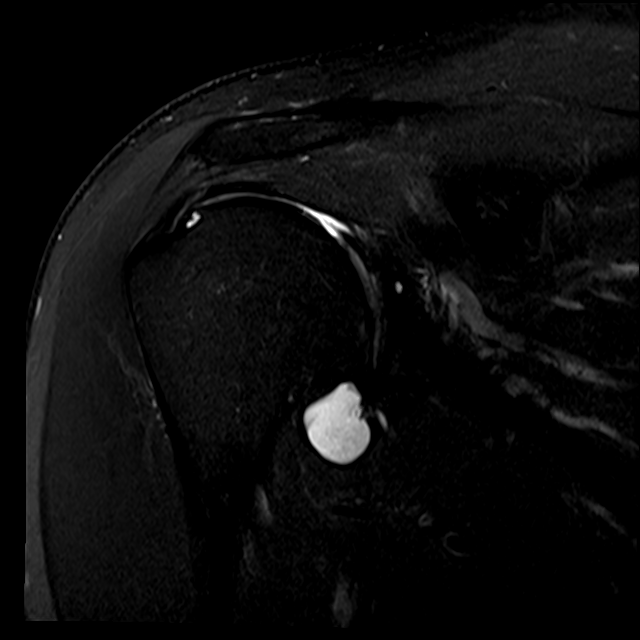
[im 19/23]
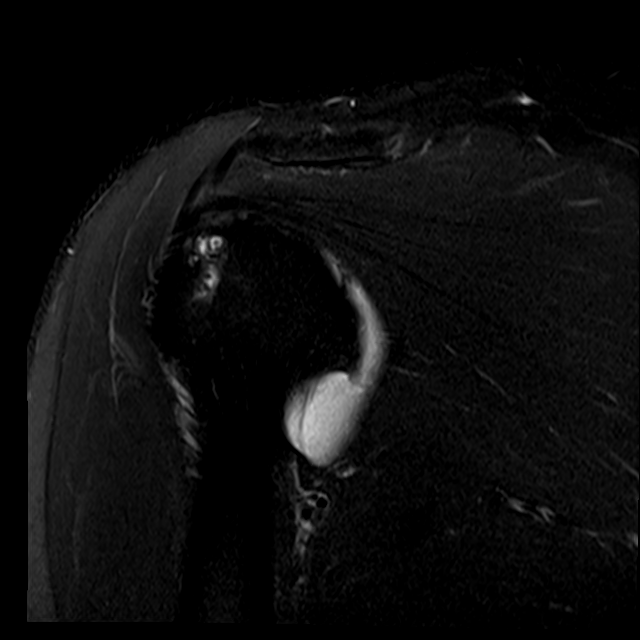

[13 of 40 positions shown; findings below may reference images not displayed]

FINDINGS: Rotator cuff: Mild infraspinatus greater than supraspinatus
tendinosis. Subscapularis and teres minor tendons within normal
limits. No rotator cuff tear.

Muscles: Preserved bulk and signal intensity of the rotator cuff
musculature without edema, atrophy, or fatty infiltration.

Biceps long head: Mild intra-articular biceps tendinosis.

Acromioclavicular Joint: Moderate arthropathy of the AC joint. No
fluid or contrast is present within the subacromial-subdeltoid
bursal space.

Glenohumeral Joint: Well distended with injected contrast. Moderate
diffuse chondral thinning and surface irregularity of the humeral
head and glenoid with areas of full-thickness chondral loss,
particularly along the superior margin of the glenoid. 5 mm area of
focal chondral delamination posteriorly along the humeral head
(series 7, image 15).

Labrum: Superior labral degeneration without a well-defined tear.

Bones: No acute fracture. No dislocation. Humeral head marginal
osteophytes. No bone marrow edema. No marrow replacing bone lesion.

Other: None.
IMPRESSION: 1. Moderate right glenohumeral joint osteoarthritis.
2. Superior labral degeneration without a well-defined tear.
3. Mild infraspinatus greater than supraspinatus tendinosis. No
rotator cuff tear.
4. Mild intra-articular biceps tendinosis.
5. Moderate arthropathy of the AC joint.

## 2021-10-03 IMAGING — MR MR SHOULDER*L* W/ CM
4 of 5 series · 13 of 40 positions shown · IV contrast (agent unspecified)
Comparison: Injection images same date.  Radiographs [DATE].

CLINICAL DATA: Chronic bilateral shoulder pain for 20 years. No
recent injury or prior relevant surgery. Instability or dislocation
suspected.

EXAM:
MR ARTHROGRAM OF THE LEFT SHOULDER
TECHNIQUE: Multiplanar, multisequence MR imaging of the left shoulder was
performed following the administration of intra-articular contrast.
CONTRAST:  See Injection Documentation.

[Series 6: T1 fat-sat · axial · left · 3.0mm · 0.36mm/px · z∈[-41,+23]mm · 3 of 25 slices shown (1 of 2)]
[im 4/25]
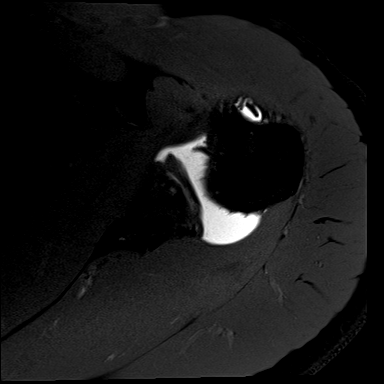
[im 13/25]
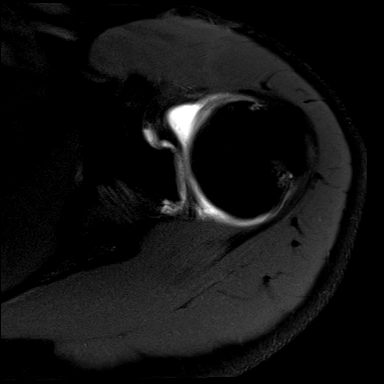
[im 22/25]
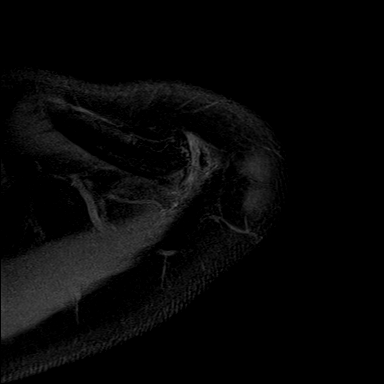

[Series 7: T2 fat-sat · sagittal · left · 3.0mm · 0.22mm/px · 4 of 28 slices shown (1 of 2)]
[im 1/28]
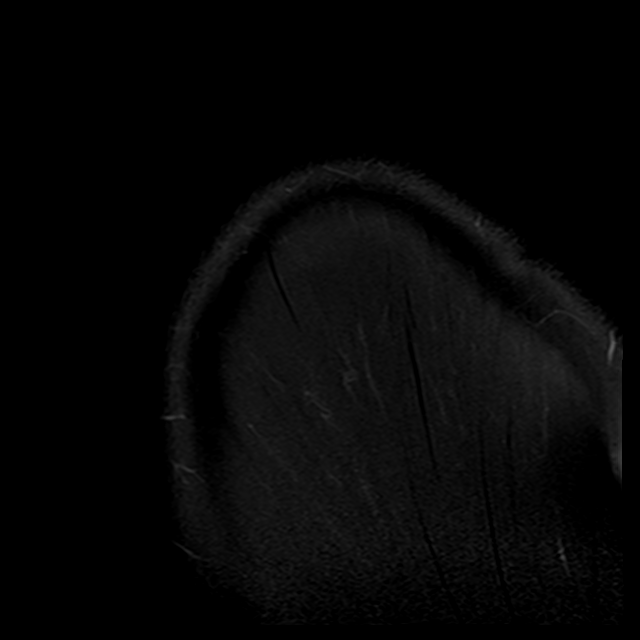
[im 4/28]
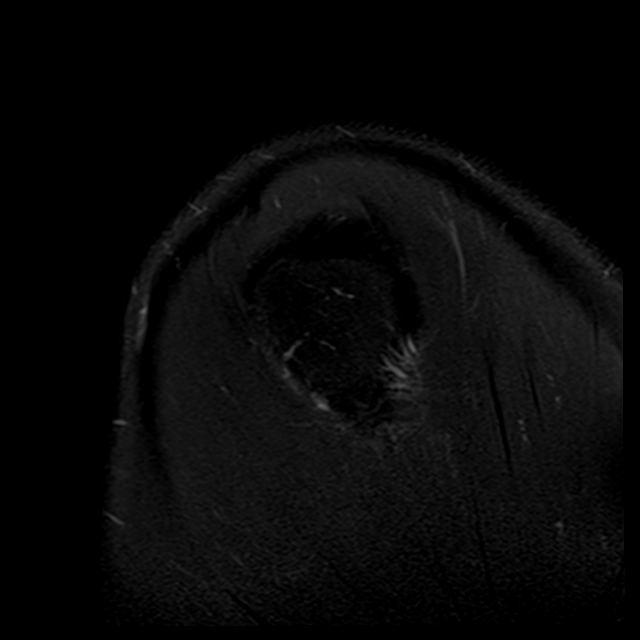
[im 16/28]
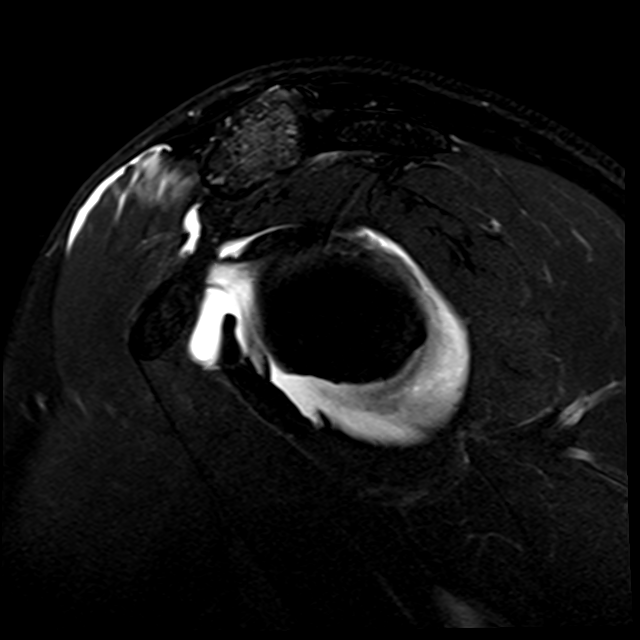
[im 25/28]
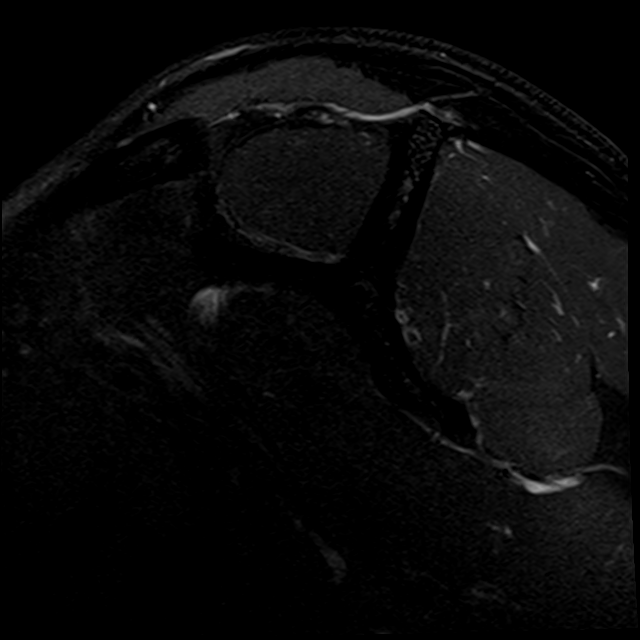

[Series 8: T1 fat-sat · oblique · left · 3.0mm · 0.18mm/px · 3 of 21 slices shown (2 of 2)]
[im 4/21]
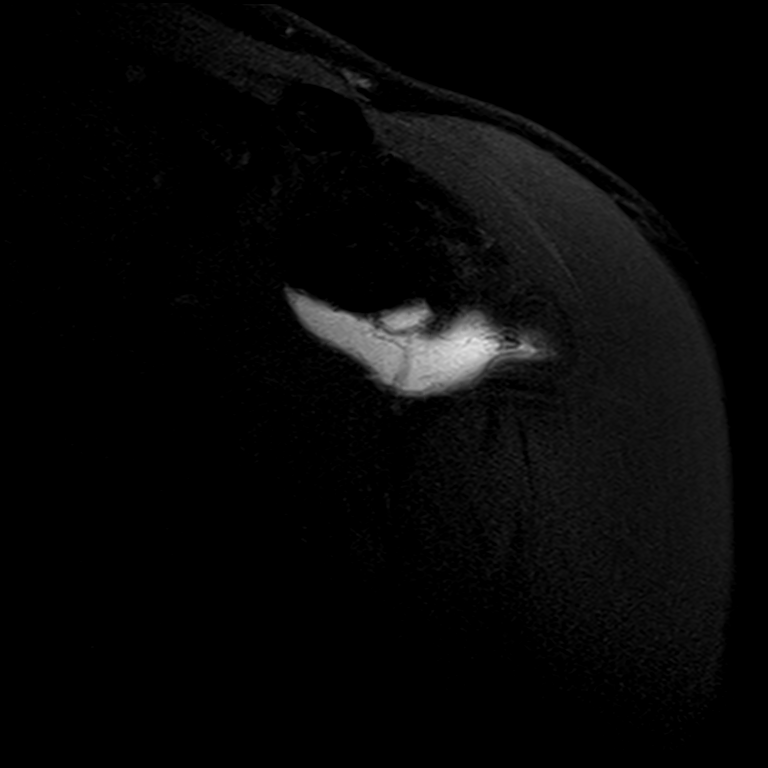
[im 11/21]
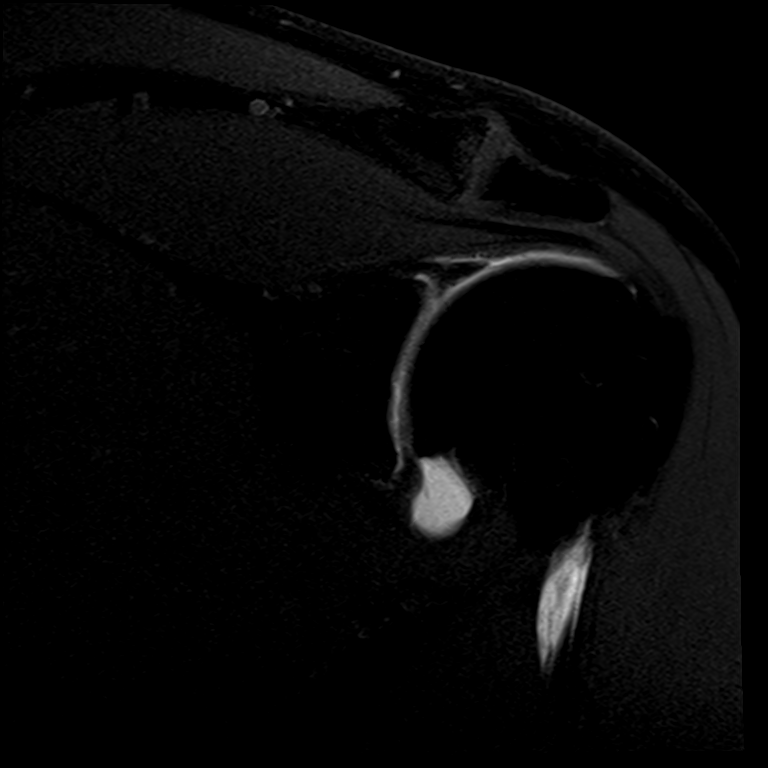
[im 17/21]
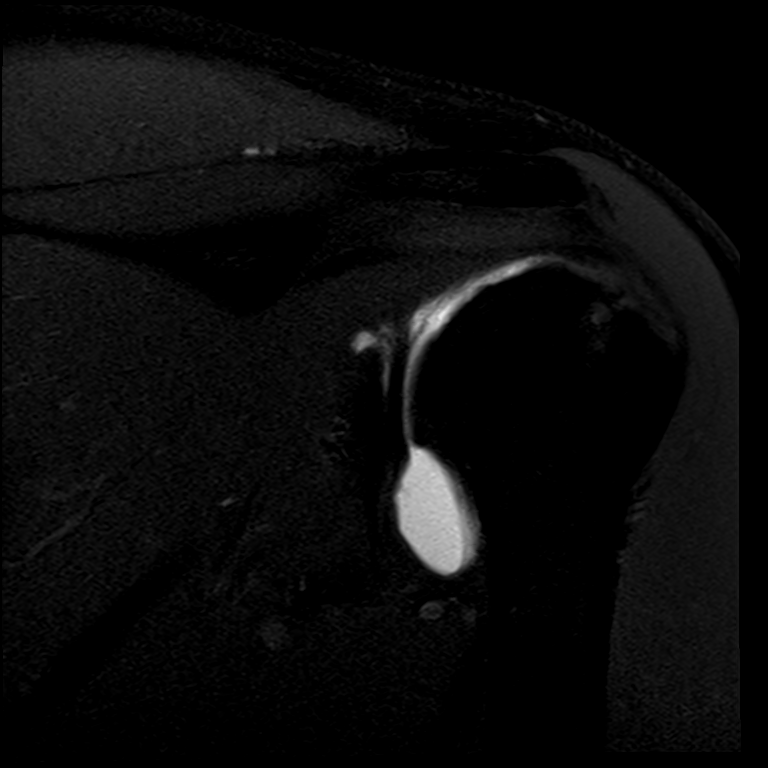

[Series 10: T2 fat-sat · oblique · left · 3.0mm · 0.22mm/px · 3 of 21 slices shown (2 of 2)]
[im 4/21]
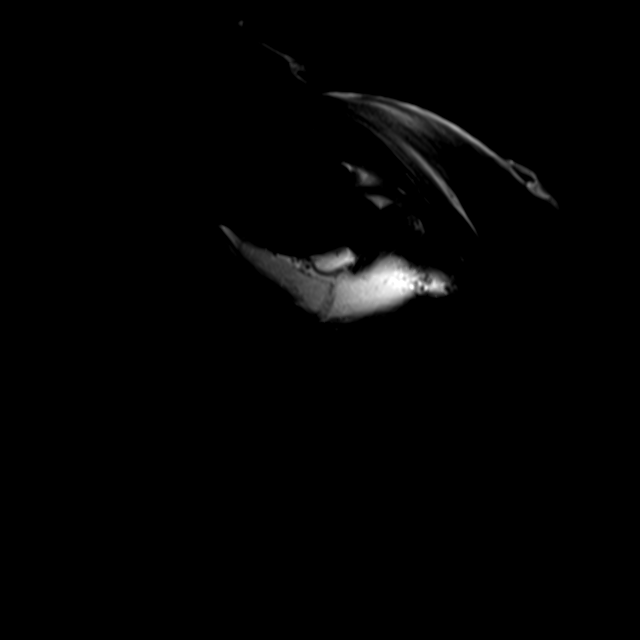
[im 11/21]
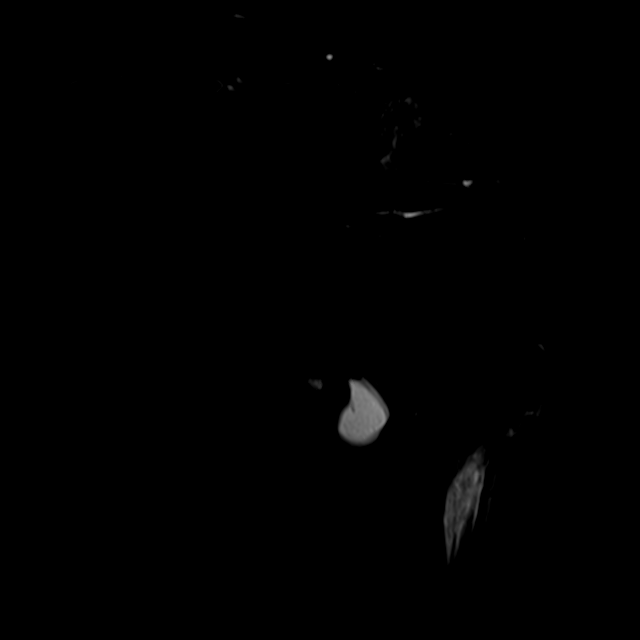
[im 17/21]
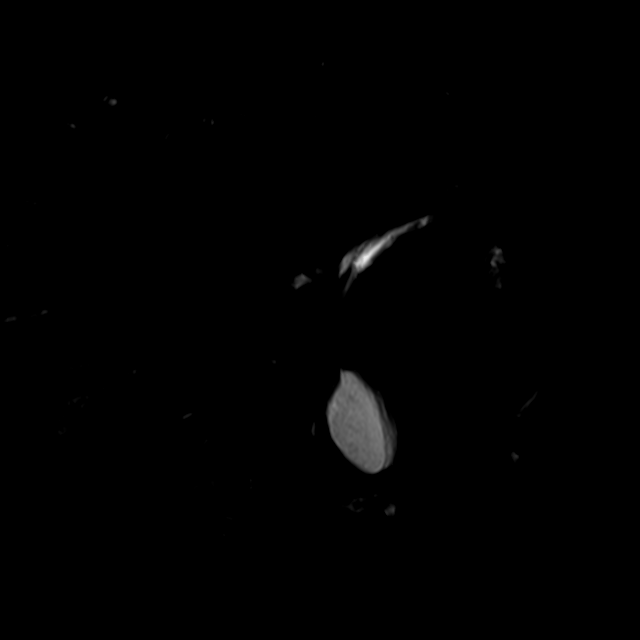

[13 of 40 positions shown; findings below may reference images not displayed]

FINDINGS: Rotator cuff: Mild supraspinatus and infraspinatus tendinosis
without evidence of tear. The subscapularis and teres minor tendons
appear normal.

Muscles:  No focal muscular atrophy or edema.

Biceps long head:  Intact and normally positioned.

Acromioclavicular Joint: The acromion is type 2. There are mild
acromioclavicular degenerative changes. No significant fluid is
present in the subacromial - subdeltoid bursa.

Glenohumeral Joint: The shoulder joint is well distended with
contrast. There are mild glenohumeral degenerative changes with
chondral thinning, surface irregularity and mild osteophyte
formation. There is mild subchondral cyst formation inferiorly in
the glenoid.

Labrum: Nearly circumferential labral tearing with small paralabral
cysts inferiorly and adjacent to the posterosuperior labrum.

Bones: No acute or significant extra-articular osseous
findings.Prominent subcortical cyst formation in the humeral head
near the infraspinatus insertion.

Other: No significant soft tissue findings.
IMPRESSION: 1. Extensive, nearly circumferential left labral tearing.
2. The rotator cuff and biceps tendon are intact with only mild
supraspinatus and infraspinatus tendinosis. No focal muscular
atrophy.
3. Intact biceps tendon.
4. Mild glenohumeral and acromioclavicular degenerative changes.

## 2021-10-03 MED ORDER — IOPAMIDOL (ISOVUE-M 200) INJECTION 41%: 15.0000 mL | Freq: Once | INTRAMUSCULAR | Status: DC

## 2021-10-03 MED ORDER — IOPAMIDOL (ISOVUE-M 200) INJECTION 41%
15.0000 mL | Freq: Once | INTRAMUSCULAR | Status: AC
  Administered 2021-10-03: 15 mL via INTRA_ARTICULAR

## 2021-10-05 ENCOUNTER — Telehealth (HOSPITAL_BASED_OUTPATIENT_CLINIC_OR_DEPARTMENT_OTHER): Payer: Self-pay | Admitting: Orthopaedic Surgery

## 2021-10-05 NOTE — Telephone Encounter (Signed)
LMOM for patient to reschedule 10/06/21 appt °

## 2021-10-06 ENCOUNTER — Telehealth (HOSPITAL_BASED_OUTPATIENT_CLINIC_OR_DEPARTMENT_OTHER): Payer: Self-pay | Admitting: Orthopaedic Surgery

## 2021-10-06 ENCOUNTER — Ambulatory Visit (HOSPITAL_BASED_OUTPATIENT_CLINIC_OR_DEPARTMENT_OTHER): Payer: 59 | Admitting: Orthopaedic Surgery

## 2021-10-06 NOTE — Telephone Encounter (Signed)
error 

## 2021-10-13 ENCOUNTER — Other Ambulatory Visit: Payer: Self-pay

## 2021-10-13 ENCOUNTER — Ambulatory Visit (INDEPENDENT_AMBULATORY_CARE_PROVIDER_SITE_OTHER): Payer: 59 | Admitting: Orthopaedic Surgery

## 2021-10-13 DIAGNOSIS — M24111 Other articular cartilage disorders, right shoulder: Secondary | ICD-10-CM | POA: Diagnosis not present

## 2021-10-13 DIAGNOSIS — M24112 Other articular cartilage disorders, left shoulder: Secondary | ICD-10-CM | POA: Diagnosis not present

## 2021-10-13 NOTE — Progress Notes (Signed)
Chief Complaint: Left wrist and right shoulder pain     History of Present Illness:   10/13/2021: Presents today with ongoing bilateral shoulder complaints.  The left continues to feel painful and unstable particularly with overhead activities.  He is here today for MRI review  Gary Hayes is a 45 y.o. male right-hand-dominant who presents with bilateral shoulder pain now going on for several years.  He did have a left anterior shoulder dislocation many years prior which subsequently was treated with conservative management.  He states that he has been doing physical therapy for multiple years on both of his shoulders.  He is able to get them to strengthen to a certain point but only is able to get so much relief from this.  He does continue to have recurrent episodes of feeling like the left shoulder is giving out.  He takes ibuprofen as needed for flareups.  He works as an Acupuncturist.  He has previously tried a dose of oral steroids which only helps somewhat.  He states the left shoulder pain has been more bothersome over the last several weeks.  He has a 16-year-old and 47 year old that he would like to stay active and play sports with.  He previously did martial arts but subsequently had very significant difficulty with this particularly with punches in the left arm as this would feel like the shoulder will give out.    Surgical History:   None  PMH/PSH/Family History/Social History/Meds/Allergies:    Past Medical History:  Diagnosis Date   Chicken pox    Frequent headaches    GERD (gastroesophageal reflux disease)    Hay fever    No past surgical history on file. Social History   Socioeconomic History   Marital status: Married    Spouse name: Not on file   Number of children: Not on file   Years of education: Not on file   Highest education level: Bachelor's degree (e.g., BA, AB, BS)  Occupational History   Not on file  Tobacco  Use   Smoking status: Never   Smokeless tobacco: Never  Substance and Sexual Activity   Alcohol use: Never    Alcohol/week: 0.0 standard drinks   Drug use: Never   Sexual activity: Not on file  Other Topics Concern   Not on file  Social History Narrative   Not on file   Social Determinants of Health   Financial Resource Strain: Low Risk    Difficulty of Paying Living Expenses: Not hard at all  Food Insecurity: No Food Insecurity   Worried About Programme researcher, broadcasting/film/video in the Last Year: Never true   Ran Out of Food in the Last Year: Never true  Transportation Needs: No Transportation Needs   Lack of Transportation (Medical): No   Lack of Transportation (Non-Medical): No  Physical Activity: Insufficiently Active   Days of Exercise per Week: 2 days   Minutes of Exercise per Session: 30 min  Stress: No Stress Concern Present   Feeling of Stress : Only a little  Social Connections: Unknown   Frequency of Communication with Friends and Family: Patient refused   Frequency of Social Gatherings with Friends and Family: Patient refused   Attends Religious Services: Patient refused   Active Member of Clubs or Organizations: No   Attends [employer]  or Organization Meetings: Not on file   Marital Status: Married   Family History  Problem Relation Age of Onset   Diabetes Mother    Cancer Father    Heart disease Paternal Grandmother    No Known Allergies Current Outpatient Medications  Medication Sig Dispense Refill   meloxicam (MOBIC) 7.5 MG tablet Take 1 tablet (7.5 mg total) by mouth daily. 30 tablet 0   No current facility-administered medications for this visit.   No results found.  Review of Systems:   A ROS was performed including pertinent positives and negatives as documented in the HPI.  Physical Exam :   Constitutional: NAD and appears stated age Neurological: Alert and oriented Psych: Appropriate affect and cooperative There were no vitals taken for this visit.    Comprehensive Musculoskeletal Exam:    Musculoskeletal Exam    Inspection Right Left  Skin No atrophy or winging No atrophy or winging  Palpation    Tenderness Glenohumeral Glenohumeral  Range of Motion    Flexion (passive) 170 170  Flexion (active) 170 170  Abduction 170 170  ER at the side 70 70  Can reach behind back to T12 T12  Strength     Full Full  Special Tests    Pseudoparalytic No No  Neurologic    Fires PIN, radial, median, ulnar, musculocutaneous, axillary, suprascapular, long thoracic, and spinal accessory innervated muscles. No abnormal sensibility  Vascular/Lymphatic    Radial Pulse 2+ 2+  Cervical Exam    Patient has symmetric cervical range of motion with negative Spurling's test.  Special Test: Right shoulder: Positive O'Brien, left shoulder: Positive anterior apprehension with a 2+ anterior load-and-shift and 1+ posterior load shift     Imaging:   Xray (3 views right shoulder, 3 views left shoulder): Mild AC joint arthritis bilaterally   I personally reviewed and interpreted the radiographs.   Assessment:   45 year old male with bilateral shoulder pain.  MRI reveals bilateral labral tears consistent with shoulder instability.  The left side is more symptomatic at this time.  I did discuss that ultimately I do think he would be a good candidate for shoulder labral repair.  He is leaning towards this option as he has had multiple years of rehab without any complete recovery.  Plan :    -Plan for left shoulder arthroscopy with labral repair      I personally saw and evaluated the patient, and participated in the management and treatment plan.  [name], MD Attending Physician, Orthopedic Surgery  This document was dictated using Dragon voice recognition software. A reasonable attempt at proof reading has been made to minimize errors.

## 2021-10-21 ENCOUNTER — Encounter (HOSPITAL_BASED_OUTPATIENT_CLINIC_OR_DEPARTMENT_OTHER): Payer: Self-pay | Admitting: Orthopaedic Surgery

## 2021-11-08 ENCOUNTER — Ambulatory Visit (HOSPITAL_BASED_OUTPATIENT_CLINIC_OR_DEPARTMENT_OTHER): Payer: Self-pay | Admitting: Orthopaedic Surgery

## 2021-11-08 DIAGNOSIS — M24112 Other articular cartilage disorders, left shoulder: Secondary | ICD-10-CM

## 2021-11-08 NOTE — Pre-Procedure Instructions (Addendum)
Surgical Instructions    Your procedure is scheduled on Tuesday 11/15/21.   Report to [facility] Main Entrance "A" at 08:30 A.M., then check in with the Admitting office.  Call this number if you have problems the morning of surgery:  [phone]   If you have any questions prior to your surgery date call [phone]: Open Monday-Friday 8am-4pm    Remember:  Do not eat after midnight the night before your surgery  You may drink clear liquids until 07:30 A.M. the morning of your surgery.   Clear liquids allowed are: Water, Non-Citrus Juices (without pulp), Carbonated Beverages, Clear Tea, Black Coffee ONLY (NO MILK, CREAM OR POWDERED CREAMER of any kind), and Gatorade   Enhanced Recovery after Surgery for Orthopedics Enhanced Recovery after Surgery is a protocol used to improve the stress on your body and your recovery after surgery.  Patient Instructions  The day of surgery (if you do NOT have diabetes):  Drink ONE (1) Pre-Surgery Clear Ensure by 07:30 am the morning of surgery   This drink was given to you during your hospital  pre-op appointment visit. Nothing else to drink after completing the  Pre-Surgery Clear Ensure.         If you have questions, please contact your surgeons office.     Take these medicines the morning of surgery with A SIP OF WATER   loratadine (CLARITIN)  acetaminophen (TYLENOL)- If needed   As of today, STOP taking any Aspirin (unless otherwise instructed by your surgeon) meloxicam (MOBIC), Aleve, Naproxen, Ibuprofen, Motrin, Advil, Goody's, BC's, all herbal medications, fish oil, and all vitamins.     After your COVID test   You are not required to quarantine however you are required to wear a well-fitting mask when you are out and around people not in your household.  If your mask becomes wet or soiled, replace with a new one.  Wash your hands often with soap and water for 20 seconds or clean your hands with an alcohol-based hand sanitizer  that contains at least 60% alcohol.  Do not share personal items.  Notify your provider: if you are in close contact with someone who has COVID  or if you develop a fever of 100.4 or greater, sneezing, cough, sore throat, shortness of breath or body aches.             Do not wear jewelry or makeup Do not wear lotions, powders, perfumes/colognes, or deodorant. Do not shave 48 hours prior to surgery.  Men may shave face and neck. Do not bring valuables to the hospital. DO Not wear nail polish, gel polish, artificial nails, or any other type of covering on natural nails including finger and toenails. If patients have artificial nails, gel coating, etc. that need to be removed by a nail salon, please have this removed prior to surgery or surgery may need to be canceled/delayed if the surgeon/ anesthesia feels like the patient is unable to be adequately monitored.             [facility] is not responsible for any belongings or valuables.  Do NOT Smoke (Tobacco/Vaping)  24 hours prior to your procedure  If you use a CPAP at night, you may bring your mask for your overnight stay.   Contacts, glasses, hearing aids, dentures or partials may not be worn into surgery, please bring cases for these belongings   For patients admitted to the hospital, discharge time will be determined by your treatment team.  Patients discharged the day of surgery will not be allowed to drive home, and someone needs to stay with them for 24 hours.  NO VISITORS WILL BE ALLOWED IN PRE-OP WHERE PATIENTS ARE PREPPED FOR SURGERY.  ONLY 1 SUPPORT PERSON MAY BE PRESENT IN THE WAITING ROOM WHILE YOU ARE IN SURGERY.  IF YOU ARE TO BE ADMITTED, ONCE YOU ARE IN YOUR ROOM YOU WILL BE ALLOWED TWO (2) VISITORS. 1 (ONE) VISITOR MAY STAY OVERNIGHT BUT MUST ARRIVE TO THE ROOM BY 8pm.  Minor children may have two parents present. Special consideration for safety and communication needs will be reviewed on a case by case  basis.  Special instructions:    Oral Hygiene is also important to reduce your risk of infection.  Remember - BRUSH YOUR TEETH THE MORNING OF SURGERY WITH YOUR REGULAR TOOTHPASTE   [facility]- Preparing For Surgery  Before surgery, you can play an important role. Because skin is not sterile, your skin needs to be as free of germs as possible. You can reduce the number of germs on your skin by washing with CHG (chlorahexidine gluconate) Soap before surgery.  CHG is an antiseptic cleaner which kills germs and bonds with the skin to continue killing germs even after washing.     Please do not use if you have an allergy to CHG or antibacterial soaps. If your skin becomes reddened/irritated stop using the CHG.  Do not shave (including legs and underarms) for at least 48 hours prior to first CHG shower. It is OK to shave your face.  Please follow these instructions carefully.     Shower the NIGHT BEFORE SURGERY and the MORNING OF SURGERY with CHG Soap.   If you chose to wash your hair, wash your hair first as usual with your normal shampoo. After you shampoo, rinse your hair and body thoroughly to remove the shampoo.  Then [employer] and genitals (private parts) with your normal soap and rinse thoroughly to remove soap.  After that Use CHG Soap as you would any other liquid soap. You can apply CHG directly to the skin and wash gently with a scrungie or a clean washcloth.   Apply the CHG Soap to your body ONLY FROM THE NECK DOWN.  Do not use on open wounds or open sores. Avoid contact with your eyes, ears, mouth and genitals (private parts). Wash Face and genitals (private parts)  with your normal soap.   Wash thoroughly, paying special attention to the area where your surgery will be performed.  Thoroughly rinse your body with warm water from the neck down.  DO NOT shower/wash with your normal soap after using and rinsing off the CHG Soap.  Pat yourself dry with a CLEAN TOWEL.  Wear CLEAN  PAJAMAS to bed the night before surgery  Place CLEAN SHEETS on your bed the night before your surgery  DO NOT SLEEP WITH PETS.   Day of Surgery:  Take a shower with CHG soap. Wear Clean/Comfortable clothing the morning of surgery Do not apply any deodorants/lotions.   Remember to brush your teeth WITH YOUR REGULAR TOOTHPASTE.   Please read over the following fact sheets that you were given.

## 2021-11-09 ENCOUNTER — Encounter (HOSPITAL_COMMUNITY): Payer: Self-pay

## 2021-11-09 ENCOUNTER — Other Ambulatory Visit (HOSPITAL_BASED_OUTPATIENT_CLINIC_OR_DEPARTMENT_OTHER): Payer: Self-pay | Admitting: Orthopaedic Surgery

## 2021-11-09 ENCOUNTER — Other Ambulatory Visit: Payer: Self-pay

## 2021-11-09 ENCOUNTER — Encounter (HOSPITAL_COMMUNITY)
Admission: RE | Admit: 2021-11-09 | Discharge: 2021-11-09 | Disposition: A | Discharge status: Home / Self Care | Payer: 59 | Source: Ambulatory Visit | Attending: Orthopaedic Surgery | Admitting: Orthopaedic Surgery

## 2021-11-09 VITALS — BP 118/84 | HR 80 | Temp 97.7°F | Resp 17 | Ht 68.0 in | Wt 180.9 lb

## 2021-11-09 DIAGNOSIS — Z01812 Encounter for preprocedural laboratory examination: Secondary | ICD-10-CM | POA: Insufficient documentation

## 2021-11-09 DIAGNOSIS — Z01818 Encounter for other preprocedural examination: Secondary | ICD-10-CM

## 2021-11-09 DIAGNOSIS — S43006A Unspecified dislocation of unspecified shoulder joint, initial encounter: Secondary | ICD-10-CM

## 2021-11-09 HISTORY — DX: Unspecified osteoarthritis, unspecified site: M19.90

## 2021-11-09 LAB — SURGICAL PCR SCREEN
MRSA, PCR: NEGATIVE
Staphylococcus aureus: POSITIVE — AB

## 2021-11-09 LAB — CBC
HCT: 42.2 % (ref 39.0–52.0)
Hemoglobin: 14.4 g/dL (ref 13.0–17.0)
MCH: 28.5 pg (ref 26.0–34.0)
MCHC: 34.1 g/dL (ref 30.0–36.0)
MCV: 83.6 fL (ref 80.0–100.0)
Platelets: 212 10*3/uL (ref 150–400)
RBC: 5.05 MIL/uL (ref 4.22–5.81)
RDW: 12.3 % (ref 11.5–15.5)
WBC: 6.4 10*3/uL (ref 4.0–10.5)
nRBC: 0 % (ref 0.0–0.2)

## 2021-11-09 NOTE — Progress Notes (Signed)
PCP - Abbe [place] Cardiologist - denies  PPM/ICD - denies   Chest x-ray - n/a EKG - n/a Stress Test - denies ECHO - denies Cardiac Cath - denies    No diabetes  As of today, STOP taking any Aspirin (unless otherwise instructed by your surgeon) meloxicam (MOBIC), Aleve, Naproxen, Ibuprofen, Motrin, Advil, Goody's, BC's, all herbal medications, fish oil, and all vitamins.  ERAS Protcol -yes PRE-SURGERY Ensure or G2- ensure ordered and give  COVID TEST- ambulatory surgery, not needed   Anesthesia review: no  Patient denies shortness of breath, fever, cough and chest pain at PAT appointment   All instructions explained to the patient, with a verbal understanding of the material. Patient agrees to go over the instructions while at home for a better understanding. Patient also instructed to self quarantine after being tested for COVID-19. The opportunity to ask questions was provided.

## 2021-11-15 ENCOUNTER — Other Ambulatory Visit: Payer: Self-pay

## 2021-11-15 ENCOUNTER — Ambulatory Visit (HOSPITAL_COMMUNITY)
Admission: RE | Admit: 2021-11-15 | Discharge: 2021-11-15 | Disposition: A | Discharge status: Home / Self Care | Payer: 59 | Attending: Orthopaedic Surgery | Admitting: Orthopaedic Surgery

## 2021-11-15 ENCOUNTER — Ambulatory Visit (HOSPITAL_COMMUNITY): Payer: 59 | Admitting: Certified Registered Nurse Anesthetist

## 2021-11-15 ENCOUNTER — Encounter (HOSPITAL_COMMUNITY)
Admission: RE | Disposition: A | Discharge status: Home / Self Care | Payer: Self-pay | Source: Home / Self Care | Attending: Orthopaedic Surgery

## 2021-11-15 ENCOUNTER — Encounter (HOSPITAL_COMMUNITY): Payer: Self-pay | Admitting: Orthopaedic Surgery

## 2021-11-15 DIAGNOSIS — Z79899 Other long term (current) drug therapy: Secondary | ICD-10-CM | POA: Insufficient documentation

## 2021-11-15 DIAGNOSIS — S43432A Superior glenoid labrum lesion of left shoulder, initial encounter: Secondary | ICD-10-CM | POA: Insufficient documentation

## 2021-11-15 DIAGNOSIS — S43431A Superior glenoid labrum lesion of right shoulder, initial encounter: Secondary | ICD-10-CM

## 2021-11-15 DIAGNOSIS — X58XXXA Exposure to other specified factors, initial encounter: Secondary | ICD-10-CM | POA: Insufficient documentation

## 2021-11-15 DIAGNOSIS — K219 Gastro-esophageal reflux disease without esophagitis: Secondary | ICD-10-CM | POA: Diagnosis not present

## 2021-11-15 DIAGNOSIS — M24112 Other articular cartilage disorders, left shoulder: Secondary | ICD-10-CM | POA: Diagnosis not present

## 2021-11-15 HISTORY — PX: SHOULDER ARTHROSCOPY WITH LABRAL REPAIR: SHX5691

## 2021-11-15 SURGERY — ARTHROSCOPY, SHOULDER, WITH GLENOID LABRUM REPAIR
Anesthesia: General | Site: Shoulder | Laterality: Left

## 2021-11-15 MED ORDER — ACETAMINOPHEN 500 MG PO TABS
500.0000 mg | ORAL_TABLET | Freq: Three times a day (TID) | ORAL | 0 refills | Status: AC
Start: 2021-11-15 — End: 2021-11-25

## 2021-11-15 MED ORDER — FENTANYL CITRATE (PF) 100 MCG/2ML IJ SOLN
INTRAMUSCULAR | Status: AC
  Administered 2021-11-15: 09:00:00 100 ug via INTRAVENOUS
  Filled 2021-11-15: qty 2

## 2021-11-15 MED ORDER — CEFAZOLIN SODIUM-DEXTROSE 2-4 GM/100ML-% IV SOLN
INTRAVENOUS | Status: AC
  Filled 2021-11-15: qty 100

## 2021-11-15 MED ORDER — SODIUM CHLORIDE 0.9 % IR SOLN
Status: DC | PRN
  Administered 2021-11-15: 6000 mL
  Administered 2021-11-15: 3000 mL
  Administered 2021-11-15: 6000 mL
  Administered 2021-11-15: 1000 mL
  Administered 2021-11-15: 3000 mL

## 2021-11-15 MED ORDER — CHLORHEXIDINE GLUCONATE 0.12 % MT SOLN
OROMUCOSAL | Status: AC
  Filled 2021-11-15: qty 15

## 2021-11-15 MED ORDER — MIDAZOLAM HCL 2 MG/2ML IJ SOLN
2.0000 mg | Freq: Once | INTRAMUSCULAR | Status: AC
  Filled 2021-11-15: qty 2

## 2021-11-15 MED ORDER — TRANEXAMIC ACID-NACL 1000-0.7 MG/100ML-% IV SOLN
1000.0000 mg | INTRAVENOUS | Status: AC
  Administered 2021-11-15: 11:00:00 1000 mg via INTRAVENOUS
  Filled 2021-11-15: qty 100

## 2021-11-15 MED ORDER — ONDANSETRON HCL 4 MG/2ML IJ SOLN: 4.0000 mg | Freq: Once | INTRAMUSCULAR | Status: DC | PRN

## 2021-11-15 MED ORDER — KETOROLAC TROMETHAMINE 30 MG/ML IJ SOLN: 30.0000 mg | Freq: Once | INTRAMUSCULAR | Status: DC | PRN

## 2021-11-15 MED ORDER — IBUPROFEN 800 MG PO TABS: 800.0000 mg | ORAL_TABLET | Freq: Three times a day (TID) | ORAL | 0 refills | Status: AC

## 2021-11-15 MED ORDER — PHENYLEPHRINE 40 MCG/ML (10ML) SYRINGE FOR IV PUSH (FOR BLOOD PRESSURE SUPPORT)
PREFILLED_SYRINGE | INTRAVENOUS | Status: AC
  Filled 2021-11-15: qty 10

## 2021-11-15 MED ORDER — LIDOCAINE 2% (20 MG/ML) 5 ML SYRINGE
INTRAMUSCULAR | Status: AC
  Filled 2021-11-15: qty 5

## 2021-11-15 MED ORDER — ONDANSETRON HCL 4 MG/2ML IJ SOLN
INTRAMUSCULAR | Status: DC | PRN
  Administered 2021-11-15: 4 mg via INTRAVENOUS

## 2021-11-15 MED ORDER — PROPOFOL 10 MG/ML IV BOLUS
INTRAVENOUS | Status: DC | PRN
  Administered 2021-11-15: 150 mg via INTRAVENOUS
  Administered 2021-11-15: 50 mg via INTRAVENOUS

## 2021-11-15 MED ORDER — MEPERIDINE HCL 25 MG/ML IJ SOLN: 6.2500 mg | INTRAMUSCULAR | Status: DC | PRN

## 2021-11-15 MED ORDER — ONDANSETRON HCL 4 MG/2ML IJ SOLN
INTRAMUSCULAR | Status: AC
  Filled 2021-11-15: qty 2

## 2021-11-15 MED ORDER — OXYCODONE HCL 5 MG PO TABS: 5.0000 mg | ORAL_TABLET | Freq: Once | ORAL | Status: DC | PRN

## 2021-11-15 MED ORDER — DEXAMETHASONE SODIUM PHOSPHATE 10 MG/ML IJ SOLN
INTRAMUSCULAR | Status: DC | PRN
Start: 2021-11-15 — End: 2021-11-15
  Administered 2021-11-15: 10 mg via INTRAVENOUS

## 2021-11-15 MED ORDER — LIDOCAINE 2% (20 MG/ML) 5 ML SYRINGE
INTRAMUSCULAR | Status: DC | PRN
Start: 2021-11-15 — End: 2021-11-15
  Administered 2021-11-15: 100 mg via INTRAVENOUS

## 2021-11-15 MED ORDER — PHENYLEPHRINE 40 MCG/ML (10ML) SYRINGE FOR IV PUSH (FOR BLOOD PRESSURE SUPPORT)
PREFILLED_SYRINGE | INTRAVENOUS | Status: DC | PRN
  Administered 2021-11-15 (×2): 80 ug via INTRAVENOUS
  Administered 2021-11-15: 40 ug via INTRAVENOUS
  Administered 2021-11-15 (×7): 80 ug via INTRAVENOUS
  Administered 2021-11-15: 40 ug via INTRAVENOUS

## 2021-11-15 MED ORDER — MIDAZOLAM HCL 2 MG/2ML IJ SOLN
INTRAMUSCULAR | Status: DC | PRN
  Administered 2021-11-15: 2 mg via INTRAVENOUS

## 2021-11-15 MED ORDER — EPINEPHRINE PF 1 MG/ML IJ SOLN
INTRAMUSCULAR | Status: AC
  Filled 2021-11-15: qty 2

## 2021-11-15 MED ORDER — OXYCODONE HCL 5 MG PO CAPS: 5.0000 mg | ORAL_CAPSULE | ORAL | 0 refills | Status: DC | PRN

## 2021-11-15 MED ORDER — PHENYLEPHRINE HCL-NACL 20-0.9 MG/250ML-% IV SOLN
INTRAVENOUS | Status: DC | PRN
  Administered 2021-11-15: 50 ug/min via INTRAVENOUS

## 2021-11-15 MED ORDER — GABAPENTIN 300 MG PO CAPS
300.0000 mg | ORAL_CAPSULE | Freq: Once | ORAL | Status: AC
  Administered 2021-11-15: 09:00:00 300 mg via ORAL

## 2021-11-15 MED ORDER — CEFAZOLIN SODIUM-DEXTROSE 2-4 GM/100ML-% IV SOLN
2.0000 g | INTRAVENOUS | Status: AC
  Administered 2021-11-15: 10:00:00 2 g via INTRAVENOUS

## 2021-11-15 MED ORDER — CHLORHEXIDINE GLUCONATE 0.12 % MT SOLN
15.0000 mL | Freq: Once | OROMUCOSAL | Status: AC
  Administered 2021-11-15: 09:00:00 15 mL via OROMUCOSAL

## 2021-11-15 MED ORDER — FENTANYL CITRATE (PF) 100 MCG/2ML IJ SOLN: 25.0000 ug | INTRAMUSCULAR | Status: DC | PRN

## 2021-11-15 MED ORDER — ORAL CARE MOUTH RINSE: 15.0000 mL | Freq: Once | OROMUCOSAL | Status: AC

## 2021-11-15 MED ORDER — DEXAMETHASONE SODIUM PHOSPHATE 10 MG/ML IJ SOLN
INTRAMUSCULAR | Status: AC
  Filled 2021-11-15: qty 1

## 2021-11-15 MED ORDER — ACETAMINOPHEN 500 MG PO TABS: 1000.0000 mg | ORAL_TABLET | Freq: Once | ORAL | Status: DC

## 2021-11-15 MED ORDER — BUPIVACAINE LIPOSOME 1.3 % IJ SUSP
INTRAMUSCULAR | Status: DC | PRN
  Administered 2021-11-15 (×5): 2 mL via PERINEURAL

## 2021-11-15 MED ORDER — ACETAMINOPHEN 325 MG PO TABS: 325.0000 mg | ORAL_TABLET | ORAL | Status: DC | PRN

## 2021-11-15 MED ORDER — PROPOFOL 10 MG/ML IV BOLUS
INTRAVENOUS | Status: AC
  Filled 2021-11-15: qty 20

## 2021-11-15 MED ORDER — EPINEPHRINE PF 1 MG/ML IJ SOLN
INTRAMUSCULAR | Status: DC | PRN
  Administered 2021-11-15: 1 mg

## 2021-11-15 MED ORDER — MIDAZOLAM HCL 2 MG/2ML IJ SOLN
INTRAMUSCULAR | Status: AC
  Filled 2021-11-15: qty 2

## 2021-11-15 MED ORDER — OXYCODONE HCL 5 MG/5ML PO SOLN: 5.0000 mg | Freq: Once | ORAL | Status: DC | PRN

## 2021-11-15 MED ORDER — BUPIVACAINE-EPINEPHRINE (PF) 0.5% -1:200000 IJ SOLN
INTRAMUSCULAR | Status: DC | PRN
  Administered 2021-11-15 (×5): 3 mL via PERINEURAL

## 2021-11-15 MED ORDER — FENTANYL CITRATE (PF) 250 MCG/5ML IJ SOLN
INTRAMUSCULAR | Status: DC | PRN
  Administered 2021-11-15: 100 ug via INTRAVENOUS

## 2021-11-15 MED ORDER — ACETAMINOPHEN 160 MG/5ML PO SOLN: 325.0000 mg | ORAL | Status: DC | PRN

## 2021-11-15 MED ORDER — FENTANYL CITRATE (PF) 250 MCG/5ML IJ SOLN
INTRAMUSCULAR | Status: AC
  Filled 2021-11-15: qty 5

## 2021-11-15 MED ORDER — GABAPENTIN 300 MG PO CAPS
ORAL_CAPSULE | ORAL | Status: AC
  Filled 2021-11-15: qty 1

## 2021-11-15 MED ORDER — ASPIRIN EC 325 MG PO TBEC: 325.0000 mg | DELAYED_RELEASE_TABLET | Freq: Every day | ORAL | 0 refills | Status: DC

## 2021-11-15 MED ORDER — ROCURONIUM BROMIDE 10 MG/ML (PF) SYRINGE
PREFILLED_SYRINGE | INTRAVENOUS | Status: DC | PRN
  Administered 2021-11-15: 70 mg via INTRAVENOUS

## 2021-11-15 MED ORDER — ROCURONIUM BROMIDE 10 MG/ML (PF) SYRINGE
PREFILLED_SYRINGE | INTRAVENOUS | Status: AC
  Filled 2021-11-15: qty 10

## 2021-11-15 MED ORDER — ACETAMINOPHEN 500 MG PO TABS
ORAL_TABLET | ORAL | Status: AC
  Filled 2021-11-15: qty 2

## 2021-11-15 MED ORDER — MIDAZOLAM HCL 2 MG/2ML IJ SOLN
INTRAMUSCULAR | Status: AC
  Administered 2021-11-15: 10:00:00 2 mg via INTRAVENOUS
  Filled 2021-11-15: qty 2

## 2021-11-15 MED ORDER — FENTANYL CITRATE (PF) 100 MCG/2ML IJ SOLN
100.0000 ug | Freq: Once | INTRAMUSCULAR | Status: AC
  Filled 2021-11-15: qty 2

## 2021-11-15 MED ORDER — SUGAMMADEX SODIUM 200 MG/2ML IV SOLN
INTRAVENOUS | Status: DC | PRN
  Administered 2021-11-15: 200 mg via INTRAVENOUS

## 2021-11-15 MED ORDER — LACTATED RINGERS IV SOLN: INTRAVENOUS | Status: DC

## 2021-11-15 SURGICAL SUPPLY — 55 items
ANCHOR SUT 1.8 FBRTK KNTLS 2SU (Anchor) ×12 IMPLANT
ANCHOR SUT BIOCOMP LK 2.9X12.5 (Anchor) ×2 IMPLANT
BAG COUNTER SPONGE SURGICOUNT (BAG) ×2 IMPLANT
BAG SURGICOUNT SPONGE COUNTING (BAG) ×1
BLADE SHAVER TORPEDO 4X13 (MISCELLANEOUS) ×3 IMPLANT
CANNULA 5.75X71 LONG (CANNULA) ×2 IMPLANT
CANNULA TWIST IN 8.25X7CM (CANNULA) ×2 IMPLANT
COVER SURGICAL LIGHT HANDLE (MISCELLANEOUS) ×3 IMPLANT
DISSECTOR 4.0MM X 13CM (MISCELLANEOUS) IMPLANT
DRAPE INCISE IOBAN 66X45 STRL (DRAPES) ×3 IMPLANT
DRAPE ORTHO SPLIT 77X108 STRL (DRAPES) ×2
DRAPE STERI 35X30 U-POUCH (DRAPES) ×6 IMPLANT
DRAPE SURG ORHT 6 SPLT 77X108 (DRAPES) IMPLANT
DRSG TEGADERM 2-3/8X2-3/4 SM (GAUZE/BANDAGES/DRESSINGS) ×1 IMPLANT
DRSG TEGADERM 4X4.75 (GAUZE/BANDAGES/DRESSINGS) IMPLANT
DRSG XEROFORM 1X8 (GAUZE/BANDAGES/DRESSINGS) ×2 IMPLANT
DW OUTFLOW CASSETTE/TUBE SET (MISCELLANEOUS) ×3 IMPLANT
GAUZE SPONGE 4X4 12PLY STRL (GAUZE/BANDAGES/DRESSINGS) ×1 IMPLANT
GAUZE XEROFORM 1X8 LF (GAUZE/BANDAGES/DRESSINGS) ×1 IMPLANT
GLOVE SRG 8 PF TXTR STRL LF DI (GLOVE) ×1 IMPLANT
GLOVE SURG ENC MOIS LTX SZ6 (GLOVE) ×6 IMPLANT
GLOVE SURG LTX SZ8 (GLOVE) ×3 IMPLANT
GLOVE SURG UNDER POLY LF SZ6.5 (GLOVE) ×3 IMPLANT
GLOVE SURG UNDER POLY LF SZ8 (GLOVE) ×2
GOWN STRL REUS W/ TWL LRG LVL3 (GOWN DISPOSABLE) ×2 IMPLANT
GOWN STRL REUS W/TWL LRG LVL3 (GOWN DISPOSABLE) ×4
KIT BASIN OR (CUSTOM PROCEDURE TRAY) ×3 IMPLANT
KIT CVD SPEAR FBRTK 1.8 DRILL (KITS) ×2 IMPLANT
KIT PERC INSERT 3.0 KNTLS (KITS) IMPLANT
KIT PUSHLOCK 2.9 HIP (KITS) ×2 IMPLANT
KIT STR SPEAR 1.8 FBRTK DISP (KITS) ×1 IMPLANT
KIT TURNOVER KIT B (KITS) ×3 IMPLANT
LASSO 45 DEG LEFT QUICKPASS (SUTURE) ×2 IMPLANT
LASSO 90 CVE QUICKPAS (DISPOSABLE) ×2 IMPLANT
LASSO CRESCENT QUICKPASS (SUTURE) ×2 IMPLANT
MANIFOLD NEPTUNE II (INSTRUMENTS) ×3 IMPLANT
NDL SPNL 18GX3.5 QUINCKE PK (NEEDLE) ×1 IMPLANT
NDL SUT 2-0 SCORPION KNEE (NEEDLE) ×1 IMPLANT
NEEDLE SPNL 18GX3.5 QUINCKE PK (NEEDLE) ×3 IMPLANT
NEEDLE SUT 2-0 SCORPION KNEE (NEEDLE) IMPLANT
NS IRRIG 1000ML POUR BTL (IV SOLUTION) ×3 IMPLANT
PACK SHOULDER (CUSTOM PROCEDURE TRAY) ×3 IMPLANT
PAD ABD 8X10 STRL (GAUZE/BANDAGES/DRESSINGS) ×2 IMPLANT
PAD ARMBOARD 7.5X6 YLW CONV (MISCELLANEOUS) ×6 IMPLANT
PORT APPOLLO RF 90DEGREE MULTI (SURGICAL WAND) IMPLANT
PROBE APOLLO 90XL (SURGICAL WAND) ×2 IMPLANT
SLING S3 LATERAL DISP (MISCELLANEOUS) IMPLANT
SPONGE T-LAP 4X18 ~~LOC~~+RFID (SPONGE) ×3 IMPLANT
SUT ETHILON 3 0 PS 1 (SUTURE) ×2 IMPLANT
SUT LASSO 90 DEG CVD (SUTURE) IMPLANT
SYR 30ML LL (SYRINGE) ×3 IMPLANT
TOWEL GREEN STERILE (TOWEL DISPOSABLE) ×3 IMPLANT
TOWEL GREEN STERILE FF (TOWEL DISPOSABLE) ×3 IMPLANT
TUBING ARTHROSCOPY IRRIG 16FT (MISCELLANEOUS) ×3 IMPLANT
WATER STERILE IRR 1000ML POUR (IV SOLUTION) ×3 IMPLANT

## 2021-11-15 NOTE — Brief Op Note (Signed)
° °  Brief Op Note  Date of Surgery: 11/15/2021  Preoperative Diagnosis: Left shoulder labral tear  Postoperative Diagnosis: same  Procedure: Procedure(s): left SHOULDER ARTHROSCOPY WITH LABRAL REPAIR  Implants: Implant Name Type Inv. Item Serial No. Manufacturer Lot No. LRB No. Used Action  ANCHOR SUT 1.8 FBRTK KNTLS 2SU - [id number] Anchor ANCHOR SUT 1.8 FBRTK KNTLS 2SU  [employer] [id number] Left 4 Implanted  ANCHOR SUT BIOCOMP LK 2.9X12.5 - [id number] Anchor ANCHOR SUT BIOCOMP LK 2.9X12.5  [employer] [id number] Left 1 Implanted  ANCHOR SUT 1.8 FBRTK KNTLS 2SU - [id number] Anchor ANCHOR SUT 1.8 FBRTK KNTLS 2SU  [employer] [id number] Left 1 Implanted  ANCHOR SUT 1.8 FBRTK KNTLS 2SU - [id number] Anchor ANCHOR SUT 1.8 FBRTK [employer] [id number] Left 1 Implanted    Surgeons: Surgeon(s): [name], MD  Anesthesia: Regional    Estimated Blood Loss: See anesthesia record  Complications: None  Condition to PACU: Stable  Benancio Deeds, MD 11/15/2021 12:37 PM

## 2021-11-15 NOTE — Interval H&P Note (Signed)
History and Physical Interval Note:  11/15/2021 10:04 AM  Gary Hayes  has presented today for surgery, with the diagnosis of Left shoulder labral tear.  The various methods of treatment have been discussed with the patient and family. After consideration of risks, benefits and other options for treatment, the patient has consented to  Procedure(s): left SHOULDER ARTHROSCOPY WITH LABRAL REPAIR (Left) as a surgical intervention.  The patient's history has been reviewed, patient examined, no change in status, stable for surgery.  I have reviewed the patient's chart and labs.  Questions were answered to the patient's satisfaction.     [name]

## 2021-11-15 NOTE — Anesthesia Procedure Notes (Signed)
Procedure Name: Intubation Date/Time: 11/15/2021 10:26 AM Performed by: Michele Rockers, CRNA Pre-anesthesia Checklist: Patient identified, Patient being monitored, Timeout performed, Emergency Drugs available and Suction available Patient Re-evaluated:Patient Re-evaluated prior to induction Oxygen Delivery Method: Circle system utilized Preoxygenation: Pre-oxygenation with 100% oxygen Induction Type: IV induction Ventilation: Mask ventilation without difficulty Laryngoscope Size: Mac, 3 and Glidescope Grade View: Grade I Tube type: Oral Tube size: 7.5 mm Number of attempts: 1 Airway Equipment and Method: Stylet Placement Confirmation: ETT inserted through vocal cords under direct vision, positive ETCO2 and breath sounds checked- equal and bilateral Secured at: 22 cm Tube secured with: Tape Dental Injury: Teeth and Oropharynx as per pre-operative assessment  Difficulty Due To: Difficult Airway- due to anterior larynx Comments: Attempted intubation with miller 2, and Dr. Jillyn Hidden with Mac 3, unable to get a full view of VC, glide used with mac 3, 7.5 ETT with 1st attempt,.

## 2021-11-15 NOTE — Anesthesia Preprocedure Evaluation (Signed)
Anesthesia Evaluation  Patient identified by MRN, date of birth, ID band Patient awake    Reviewed: Allergy & Precautions, NPO status , Patient's Chart, lab work & pertinent test results  Airway Mallampati: I       Dental no notable dental hx.    Pulmonary neg pulmonary ROS,    Pulmonary exam normal        Cardiovascular negative cardio ROS Normal cardiovascular exam     Neuro/Psych negative psych ROS   GI/Hepatic Neg liver ROS,   Endo/Other  negative endocrine ROS  Renal/GU negative Renal ROS  negative genitourinary   Musculoskeletal   Abdominal Normal abdominal exam  (+)   Peds  Hematology negative hematology ROS (+)   Anesthesia Other Findings   Reproductive/Obstetrics                             Anesthesia Physical Anesthesia Plan  ASA: 2  Anesthesia Plan: General   Post-op Pain Management: Regional block   Induction: Intravenous  PONV Risk Score and Plan: 2 and Ondansetron, Dexamethasone and Midazolam  Airway Management Planned: Oral ETT  Additional Equipment: None  Intra-op Plan:   Post-operative Plan: Extubation in OR  Informed Consent: I have reviewed the patients History and Physical, chart, labs and discussed the procedure including the risks, benefits and alternatives for the proposed anesthesia with the patient or authorized representative who has indicated his/her understanding and acceptance.     Dental advisory given  Plan Discussed with: CRNA  Anesthesia Plan Comments:         Anesthesia Quick Evaluation

## 2021-11-15 NOTE — Progress Notes (Signed)
Orthopedic Tech Progress Note Patient Details:  ALOYS HUPFER May 20, 1976 [id number]  OR RN called requesting an ABDUCTION SHOULDER PILLOW to be dropped off to PACU  Ortho Devices Type of Ortho Device: Shoulder abduction pillow Ortho Device/Splint Location: LUE Ortho Device/Splint Interventions: Ordered   Post Interventions Patient Tolerated: Well Instructions Provided: Care of device  Donald Pore 11/15/2021, 1:07 PM

## 2021-11-15 NOTE — Anesthesia Postprocedure Evaluation (Signed)
Anesthesia Post Note  Patient: Gary Hayes  Procedure(s) Performed: left SHOULDER ARTHROSCOPY WITH LABRAL REPAIR (Left: Shoulder)     Patient location during evaluation: PACU Anesthesia Type: General Level of consciousness: awake Pain management: pain level controlled Vital Signs Assessment: post-procedure vital signs reviewed and stable Respiratory status: spontaneous breathing Cardiovascular status: stable Postop Assessment: no apparent nausea or vomiting Anesthetic complications: no   No notable events documented.  Last Vitals:  Vitals:   11/15/21 1310 11/15/21 1325  BP: 99/83 105/77  Pulse: 67 79  Resp: 15 18  Temp:    SpO2: 100% 99%    Last Pain:  Vitals:   11/15/21 1325  TempSrc:   PainSc: 0-No pain                 Huston Foley

## 2021-11-15 NOTE — Op Note (Addendum)
Date of Surgery: 11/15/2021  INDICATIONS: Mr. 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  The risk and benefits of the procedure with discussed in detail and documented in the pre-operative evaluation.  PREOPERATIVE DIAGNOSIS: 1. Left shoulder inferior labral tear, superior labral tear  POSTOPERATIVE DIAGNOSIS: Same.  PROCEDURE: 1. Left shoulder labral arthroscopic Bankart repair 2. Left shoulder superior labral repair. 3.  Limited shoulder debridement  SURGEON: Benancio Deeds MD  ASSISTANT: Kerby Less, ATC; necessary for the timely completion of procedure and due to complexity of procedure.  ANESTHESIA:  general  IV FLUIDS AND URINE: See anesthesia record.  ANTIBIOTICS: Ancef 2g  ESTIMATED BLOOD LOSS: 10 mL.  IMPLANTS:  Implant Name Type Inv. Item Serial No. Manufacturer Lot No. LRB No. Used Action  ANCHOR SUT 1.8 FBRTK KNTLS 2SU - [id number] Anchor ANCHOR SUT 1.8 FBRTK KNTLS 2SU  [employer] [id number] Left 4 Implanted  ANCHOR SUT BIOCOMP LK 2.9X12.5 - [id number] Anchor ANCHOR SUT BIOCOMP LK 2.9X12.5  [employer] [id number] Left 1 Implanted  ANCHOR SUT 1.8 FBRTK KNTLS 2SU - [id number] Anchor ANCHOR SUT 1.8 FBRTK KNTLS 2SU  [employer] [id number] Left 1 Implanted  ANCHOR SUT 1.8 FBRTK KNTLS 2SU - [id number] Anchor ANCHOR SUT 1.8 FBRTK KNTLS 2SU  [employer] [id number] Left 1 Implanted    DRAINS: None  CULTURES: None  COMPLICATIONS: none  DESCRIPTION OF PROCEDURE:  Examination under anesthesia revealed forward elevation of 160 degrees.  In abduction, there was 80 degrees of external rotation and 60 degrees of internal rotation.  With the arm at the side, there was 55 degrees of external rotation.  There is a 2+ anterior load shift and a 1+ posterior load shift.  8 mm of inferior humeral head translation in internal rotation, 8 mm in neutral rotation, 8 mm in external rotation.   Arthroscopic findings demonstrated:  Glenoid  cartilage: Grade 1 cartilage changes Humeral head: Grade 2 cartilage changes Labrum: Tearing from the 2:00 to 7 o'clock position on the glenoid clock face with minimal bony loss Biceps insertion: Intact Biceps tendon: Intact Subscapularis insertion: Intact Rotator cuff: Intact  The patient was identified in the preoperative holding area.  Correct site was marked according universal protocol.  Anesthesia performed a nerve block.   He was subsequently taken back to the operating room.  Anesthesia was induced.  He was positioned in the lateral position with all bony prominences padded.  His prepped and draped in the usual standard fashion.  Final timeout was performed.  Standard posterior, anterior and anterosuperolateral portals.  We began with a standard posterior portal.  A full diagnostic arthroscopy was performed as described above.  A low anterior portal just above the rolled border of the subscapularis was identified with a spinal needle, and a yellow cannula was placed right above it.  An anterosuperolateral viewing portal was localized with a spinal needle just posterior to the biceps tendon, and a red cannula was placed in this position.  There was use to initially debride areas of fraying of the glenoid surface as well as the humeral head and at the base of the superior labrum.  First, I directed my attention to preparation of the glenoid, labrum and capsule for repair.  This included elevating the tissue with the elevator as well as utilizing the torpedo shaver at the bony margin in order to create a healing bony bed..    Next, I sequentially repaired the capsule and labrum from  the 7:00 position to the 2:00 position with a total of 4 anchors.  These were knotless one-point 8 suture tacks anchors.  One anchor at the 3:30 position and pulled out and subsequently a push lock was used in this position to approximate the labrum.  Anchors were placed at the 6:30, 5:30,3:30 position, and 2:00.  At  each location, a pilot hole was drilled, the anchor inserted and deployed.  Then, one limb of suture was shuttled around the labrum and capsule using a arthroscopic lasso, taking care to provide both medial to lateral and inferior to superior shift of the tissues.  The limbs were then fed into the knotless mechanism and subsequently tensioned.    Once completed, the labrum was restored to an anatomic position, and tension was restored to both bands of the IGHL.  The inferior capsular volume was normalized and the humeral head was centered on the glenoid.   All instruments were removed, fluid was evacuated, and the arthroscopy portals were closed with 3-0 nylon.  A sterile dressing was applied followed by an Iceman device and a sling with an abduction pillow.    The patient awoke from anesthesia without difficulty and was transferred to PACU in stable condition.      Kerby Less ATC was necessary for opening, closing, retracting, limb positioning and overall facilitation and timely completion of the procedure.     POSTOPERATIVE PLAN: The patient will be nonweightbearing on the left upper extremity.  He will be in a sling.  He will be seen back in 2 weeks for suture removal.  He will begin physical therapy immediately postop  Benancio Deeds, MD 12:46 PM

## 2021-11-15 NOTE — Transfer of Care (Signed)
Immediate Anesthesia Transfer of Care Note  Patient: Gary Hayes  Procedure(s) Performed: left SHOULDER ARTHROSCOPY WITH LABRAL REPAIR (Left: Shoulder)  Patient Location: PACU  Anesthesia Type:GA combined with regional for post-op pain  Level of Consciousness: drowsy, patient cooperative and responds to stimulation  Airway & Oxygen Therapy: Patient Spontanous Breathing and Patient connected to nasal cannula oxygen  Post-op Assessment: Report given to RN and Post -op Vital signs reviewed and stable  Post vital signs: Reviewed and stable  Last Vitals:  Vitals Value Taken Time  BP    Temp    Pulse 70 11/15/21 1255  Resp 13 11/15/21 1255  SpO2 100 % 11/15/21 1255  Vitals shown include unvalidated device data.  Last Pain:  Vitals:   11/15/21 0831  TempSrc:   PainSc: 0-No pain         Complications: No notable events documented.

## 2021-11-15 NOTE — Anesthesia Procedure Notes (Addendum)
Anesthesia Regional Block: Interscalene brachial plexus block   Pre-Anesthetic Checklist: , timeout performed,  Correct Patient, Correct Site, Correct Laterality,  Correct Procedure, Correct Position, site marked,  Risks and benefits discussed,  Surgical consent,  Pre-op evaluation,  At surgeon's request and post-op pain management  Laterality: Upper and Left  Prep: chloraprep       Needles:  Injection technique: Single-shot  Needle Type: Echogenic Stimulator Needle     Needle Length: 9cm  Needle Gauge: 20   Needle insertion depth: 1 cm   Additional Needles:   Procedures:,,,, ultrasound used (permanent image in chart),,    Narrative:  Start time: 11/15/2021 9:15 AM End time: 11/15/2021 9:25 AM Injection made incrementally with aspirations every 5 mL.  Performed by: Personally  Anesthesiologist: Lyn Hollingshead, MD

## 2021-11-15 NOTE — H&P (Signed)
Chief Complaint: Left wrist and right shoulder pain        History of Present Illness:    10/13/2021: Presents today with ongoing bilateral shoulder complaints.  The left continues to feel painful and unstable particularly with overhead activities.  He is here today for MRI review   Gary Hayes is a 46 y.o. male right-hand-dominant who presents with bilateral shoulder pain now going on for several years.  He did have a left anterior shoulder dislocation many years prior which subsequently was treated with conservative management.  He states that he has been doing physical therapy for multiple years on both of his shoulders.  He is able to get them to strengthen to a certain point but only is able to get so much relief from this.  He does continue to have recurrent episodes of feeling like the left shoulder is giving out.  He takes ibuprofen as needed for flareups.  He works as an Acupuncturist.  He has previously tried a dose of oral steroids which only helps somewhat.  He states the left shoulder pain has been more bothersome over the last several weeks.  He has a 64-year-old and 48 year old that he would like to stay active and play sports with.  He previously did martial arts but subsequently had very significant difficulty with this particularly with punches in the left arm as this would feel like the shoulder will give out.       Surgical History:   None   PMH/PSH/Family History/Social History/Meds/Allergies:         Past Medical History:  Diagnosis Date   Chicken pox     Frequent headaches     GERD (gastroesophageal reflux disease)     Hay fever      No past surgical history on file. Social History         Socioeconomic History   Marital status: Married      Spouse name: Not on file   Number of children: Not on file   Years of education: Not on file   Highest education level: Bachelor's degree (e.g., BA, AB, BS)  Occupational History   Not  on file  Tobacco Use   Smoking status: Never   Smokeless tobacco: Never  Substance and Sexual Activity   Alcohol use: Never      Alcohol/week: 0.0 standard drinks   Drug use: Never   Sexual activity: Not on file  Other Topics Concern   Not on file  Social History Narrative   Not on file    Social Determinants of Health       Financial Resource Strain: Low Risk    Difficulty of Paying Living Expenses: Not hard at all  Food Insecurity: No Food Insecurity   Worried About Programme researcher, broadcasting/film/video in the Last Year: Never true   Ran Out of Food in the Last Year: Never true  Transportation Needs: No Transportation Needs   Lack of Transportation (Medical): No   Lack of Transportation (Non-Medical): No  Physical Activity: Insufficiently Active   Days of Exercise per Week: 2 days   Minutes of Exercise per Session: 30 min  Stress: No Stress Concern Present   Feeling of Stress : Only a little  Social Connections: Unknown   Frequency of Communication with Friends and Family: Patient refused   Frequency of Social Gatherings with Friends and  Family: Patient refused   Attends Religious Services: Patient refused   Active Member of Clubs or Organizations: No   Attends Engineer, structural: Not on file   Marital Status: Married         Family History  Problem Relation Age of Onset   Diabetes Mother     Cancer Father     Heart disease Paternal Grandmother      No Known Allergies       Current Outpatient Medications  Medication Sig Dispense Refill   meloxicam (MOBIC) 7.5 MG tablet Take 1 tablet (7.5 mg total) by mouth daily. 30 tablet 0    No current facility-administered medications for this visit.    Imaging Results (Last 48 hours)  No results found.     Review of Systems:   A ROS was performed including pertinent positives and negatives as documented in the HPI.   Physical Exam :   Constitutional: NAD and appears stated age Neurological: Alert and oriented Psych:  Appropriate affect and cooperative There were no vitals taken for this visit.    Comprehensive Musculoskeletal Exam:     Musculoskeletal Exam      Inspection Right Left  Skin No atrophy or winging No atrophy or winging  Palpation      Tenderness Glenohumeral Glenohumeral  Range of Motion      Flexion (passive) 170 170  Flexion (active) 170 170  Abduction 170 170  ER at the side 70 70  Can reach behind back to T12 T12  Strength        Full Full  Special Tests      Pseudoparalytic No No  Neurologic      Fires PIN, radial, median, ulnar, musculocutaneous, axillary, suprascapular, long thoracic, and spinal accessory innervated muscles. No abnormal sensibility  Vascular/Lymphatic      Radial Pulse 2+ 2+  Cervical Exam      Patient has symmetric cervical range of motion with negative Spurling's test.  Special Test: Right shoulder: Positive O'Brien, left shoulder: Positive anterior apprehension with a 2+ anterior load-and-shift and 1+ posterior load shift        Imaging:   Xray (3 views right shoulder, 3 views left shoulder): Mild AC joint arthritis bilaterally     I personally reviewed and interpreted the radiographs.     Assessment:   46 year old male with bilateral shoulder pain.  MRI reveals bilateral labral tears consistent with shoulder instability.  The left side is more symptomatic at this time.  I did discuss that ultimately I do think he would be a good candidate for shoulder labral repair.  He is leaning towards this option as he has had multiple years of rehab without any complete recovery.   Plan :     -Plan for left shoulder arthroscopy with labral repair

## 2021-11-15 NOTE — Discharge Instructions (Signed)
° ° °   Discharge Instructions    Attending Surgeon: Yehya Brendle, MD Office Phone Number: [phone]   Diagnosis and Procedures:    Surgeries Performed: Left shoulder labral repair  Discharge Plan:    Diet: Resume usual diet. Begin with light or bland foods.  Drink plenty of fluids.  Activity:  Keep sling and dressing in place until your follow up visit in Physical Therapy You are advised to go home directly from the hospital or surgical center. Restrict your activities.  GENERAL INSTRUCTIONS: 1.  Keep your surgical site elevated above your heart for at least 5-7 days or longer to prevent swelling. This will improve your comfort and your overall recovery following surgery.     2. Please call Dr. Dayshia Ballinas's office at [phone] with questions Monday-Friday during business hours. If no one answers, please leave a message and someone should get back to the patient within 24 hours. For emergencies please call 911 or proceed to the emergency room.   3. Patient to notify surgical team if experiences any of the following: Bowel/Bladder dysfunction, uncontrolled pain, nerve/muscle weakness, incision with increased drainage or redness, nausea/vomiting and Fever greater than 101.0 F.  Be alert for signs of infection including redness, streaking, odor, fever or chills. Be alert for excessive pain or bleeding and notify your surgeon immediately.  WOUND INSTRUCTIONS:   Leave your dressing/cast/splint in place until your post operative visit.  Keep it clean and dry.  Always keep the incision clean and dry until the staples/sutures are removed. If there is no drainage from the incision you should keep it open to air. If there is drainage from the incision you must keep it covered at all times until the drainage stops  Do not soak in a bath tub, hot tub, pool, lake or other body of water until 21 days after your surgery and your incision is completely dry and healed.  If you have removable  sutures (or staples) they must be removed 10-14 days (unless otherwise instructed) from the day of your surgery.     1)  Elevate the extremity as much as possible.  2)  Keep the dressing clean and dry.  3)  Please call us if the dressing becomes wet or dirty.  4)  If you are experiencing worsening pain or worsening swelling, please call.     MEDICATIONS: Resume all previous home medications at the previous prescribed dose and frequency unless otherwise noted Start taking the  pain medications on an as-needed basis as prescribed  Please taper down pain medication over the next week following surgery.  Ideally you should not require a refill of any narcotic pain medication.  Take pain medication with food to minimize nausea. In addition to the prescribed pain medication, you may take over-the-counter pain relievers such as Tylenol.  Do NOT take additional tylenol if your pain medication already has tylenol in it.  Aspirin 325mg daily for four weeks.      FOLLOWUP INSTRUCTIONS: 1. Follow up at the Physical Therapy Clinic 3-4 days following surgery. This appointment should be scheduled unless other arrangements have been made.The Physical Therapy scheduling number is [phone] if an appointment has not already been arranged.  2. Contact Dr. Sabrina Keough's office during office hours at [phone] or the practice after hours line at [phone] for non-emergencies. For medical emergencies call 911.   Discharge Location: Home  

## 2021-11-16 ENCOUNTER — Encounter (HOSPITAL_COMMUNITY): Payer: Self-pay | Admitting: Orthopaedic Surgery

## 2021-11-18 ENCOUNTER — Ambulatory Visit (HOSPITAL_BASED_OUTPATIENT_CLINIC_OR_DEPARTMENT_OTHER): Payer: 59 | Attending: Orthopaedic Surgery | Admitting: Physical Therapy

## 2021-11-18 ENCOUNTER — Encounter (HOSPITAL_BASED_OUTPATIENT_CLINIC_OR_DEPARTMENT_OTHER): Payer: Self-pay | Admitting: Physical Therapy

## 2021-11-18 ENCOUNTER — Other Ambulatory Visit: Payer: Self-pay

## 2021-11-18 DIAGNOSIS — M25612 Stiffness of left shoulder, not elsewhere classified: Secondary | ICD-10-CM | POA: Insufficient documentation

## 2021-11-18 DIAGNOSIS — S43006A Unspecified dislocation of unspecified shoulder joint, initial encounter: Secondary | ICD-10-CM | POA: Insufficient documentation

## 2021-11-18 DIAGNOSIS — M25512 Pain in left shoulder: Secondary | ICD-10-CM | POA: Diagnosis not present

## 2021-11-18 DIAGNOSIS — Z9889 Other specified postprocedural states: Secondary | ICD-10-CM | POA: Insufficient documentation

## 2021-11-18 DIAGNOSIS — Y939 Activity, unspecified: Secondary | ICD-10-CM | POA: Insufficient documentation

## 2021-11-18 NOTE — Therapy (Signed)
OUTPATIENT PHYSICAL THERAPY SHOULDER EVALUATION   Patient Name: Gary Hayes MRN: [id number] DOB:[birth year], 46 y.o., male Today's Date: 11/18/2021   PT End of Session - 11/18/21 0933     Visit Number 1    Number of Visits 33    Date for PT Re-Evaluation 03/10/22    Authorization Type U[employer]    PT Start Time 0930    PT Stop Time 1003    PT Time Calculation (min) 33 min    Activity Tolerance Patient tolerated treatment well    Behavior During Therapy WFL for tasks assessed/performed             Past Medical History:  Diagnosis Date   Arthritis    bilateral shoulders   Chicken pox    Frequent headaches    GERD (gastroesophageal reflux disease)    Hay fever    Past Surgical History:  Procedure Laterality Date   SHOULDER ARTHROSCOPY WITH LABRAL REPAIR Left 11/15/2021   Procedure: left SHOULDER ARTHROSCOPY WITH LABRAL REPAIR;  Surgeon: Vanetta Mulders, MD;  Location: [facility];  Service: Orthopedics;  Laterality: Left;   WISDOM TOOTH EXTRACTION     Patient Active Problem List   Diagnosis Date Noted   Labral tear of shoulder, degenerative, left    Plantar fasciitis 12/20/2018   Tightness of right gastrocnemius muscle 12/20/2018   Shoulder separation 12/20/2018    PCP: Billie Ruddy, MD  REFERRING PROVIDER: Vanetta Mulders, MD  REFERRING DIAG: s/p Labral repair  THERAPY DIAG:  Acute pain of left shoulder  Stiffness of left shoulder, not elsewhere classified   ONSET DATE: 11/15/21-surgery  SUBJECTIVE:                                                                                                                                                                                      SUBJECTIVE STATEMENT: Block wore off after about 24 hours. Originally injured shoulder about 25 yr ago  PERTINENT HISTORY: none  PAIN:  Are you having pain? Yes NPRS scale: 4/10 Pain location: Lt shoulder PAIN TYPE: aching  Aggravating factors: being up and around Relieving  factors: ice, meds  PRECAUTIONS: Shoulder  WEIGHT BEARING RESTRICTIONS Yes labral repair  FALLS:  Has patient fallen in last 6 months? No Number of falls: 0  LIVING ENVIRONMENT: Lives with: lives with their family and lives with their spouse   OCCUPATION: Chief Financial Officer, mostly at a computer  PLOF: Independent  PATIENT GOALS exercise, sports- basketball, running, baseball  OBJECTIVE:    PATIENT SURVEYS:  FOTO 30  COGNITION:  Overall cognitive status: Within functional limits for tasks assessed     SENSATION:  WFL  POSTURE: Forward  rounded insling as appropriate  PALPATION: Incision sites well-healing without s/s of infection  UPPER EXTREMITY AROM/PROM:  A/PROM Right 11/18/2021 Left 11/18/2021  Shoulder flexion    Shoulder extension    Shoulder abduction    Shoulder adduction    Shoulder internal rotation    Shoulder external rotation    Elbow flexion    Elbow extension    Wrist flexion    Wrist extension    Wrist ulnar deviation    Wrist radial deviation    Wrist pronation    Wrist supination    (Blank rows = not tested)  UPPER EXTREMITY MMT:  MMT Right 11/18/2021 Left 11/18/2021  Shoulder flexion    Shoulder extension    Shoulder abduction    Shoulder adduction    Shoulder internal rotation    Shoulder external rotation    Middle trapezius    Lower trapezius    Elbow flexion    Elbow extension    Wrist flexion    Wrist extension    Wrist ulnar deviation    Wrist radial deviation    Wrist pronation    Wrist supination    Grip strength (lbs)    (Blank rows = not tested)  SHOULDER SPECIAL TESTS:  Not appropriate at eval       TODAY'S TREATMENT:  EVAL:  Therapist changed bandages  AAROM biceps curls  Wrist/hand AROM   PATIENT EDUCATION: Education details: Geophysicist/field seismologist of condition, POC, HEP, exercise form/rationale  Person educated: Patient Education method: Consulting civil engineer, Demonstration, Tactile cues, Verbal cues, and  Handouts Education comprehension: verbalized understanding, returned demonstration, verbal cues required, tactile cues required, and needs further education   HOME EXERCISE PROGRAM: No code on day 1 - PROM biceps curls, wrist & hand AROM  ASSESSMENT:  CLINICAL IMPRESSION: Patient is a 46 y.o. M who was seen today for physical therapy evaluation and treatment for s/p Lt shoulder labral repair. Pt is having some discomfort as expected post op and was encouraged to ice frequently and use prescribed pain meds PRN, as prescribed. Some bruising noted at proximal end of superior shoulder region. Pt would like to return to an active lifestyle and is understanding of need to allow for safe healing.  Objective impairments include decreased ROM, decreased strength, increased muscle spasms, impaired flexibility, impaired UE functional use, improper body mechanics, postural dysfunction, and pain. These impairments are limiting patient from cleaning, meal prep, occupation, yard work, and physical fitness . Personal factors including 1 comorbidity: h/o shoulder separation  are also affecting patient's functional outcome. Patient will benefit from skilled PT to address above impairments and improve overall function.  REHAB POTENTIAL: Good  CLINICAL DECISION MAKING: Stable/uncomplicated  EVALUATION COMPLEXITY: Low   GOALS: Goals reviewed with patient? Yes  SHORT TERM GOALS:  STG Name Target Date Goal status  1 Passive & AA flexion to 90 without increased pain Baseline: unable at eval 12/16/2021 INITIAL  2 Demo proper activation of periscapular musculature while performing isometrics Baseline: will progress into these exercises as appropriate 12/16/2021 INITIAL  3 Pt will verbalize compliance with & understanding of restrictions while performing ADLs Baseline:will educate as appropriate 12/16/2021 INITIAL                       LONG TERM GOALS:   LTG Name Target Date Goal status  1 Flexion to 160  with minimal to no discomfort Baseline: no motion at eval 01/13/2022 INITIAL  2 Able to reach center belt loop behind back  without compensation Baseline:unable at eval 01/13/2022 INITIAL  3 Able to demo scapular control in lifting and maneuvering 5lb weight at and above shoulder height Baseline:unable at eval 02/10/2022 INITIAL  4 Will begin plyometric and CKC exercises  Baseline: will progress as appropriate 02/10/2022 INITIAL  5 PT and pt will collaborate to create further objective goals  Baseline: to be set at later date 02/10/2022 INITIAL             PLAN: PT FREQUENCY: 1-2x/week  PT DURATION: 12 weeks  PLANNED INTERVENTIONS: Therapeutic exercises, Therapeutic activity, Neuro Muscular re-education, Patient/Family education, Joint mobilization, Aquatic Therapy, Dry Needling, Electrical stimulation, Cryotherapy, Moist heat, scar mobilization, Taping, Vasopneumatic device, and Manual therapy  PLAN FOR NEXT SESSION: PROM per protocol, periscap activation  Chauna Osoria C. Marvene Strohm PT, DPT 11/18/21 9:33 PM

## 2021-11-24 ENCOUNTER — Other Ambulatory Visit: Payer: Self-pay

## 2021-11-24 ENCOUNTER — Ambulatory Visit (HOSPITAL_BASED_OUTPATIENT_CLINIC_OR_DEPARTMENT_OTHER): Payer: 59 | Attending: Orthopaedic Surgery | Admitting: Physical Therapy

## 2021-11-24 ENCOUNTER — Encounter (HOSPITAL_BASED_OUTPATIENT_CLINIC_OR_DEPARTMENT_OTHER): Payer: Self-pay | Admitting: Physical Therapy

## 2021-11-24 DIAGNOSIS — M25512 Pain in left shoulder: Secondary | ICD-10-CM | POA: Diagnosis not present

## 2021-11-24 DIAGNOSIS — M25612 Stiffness of left shoulder, not elsewhere classified: Secondary | ICD-10-CM | POA: Insufficient documentation

## 2021-11-24 NOTE — Therapy (Signed)
OUTPATIENT PHYSICAL THERAPY TREATMENT   Patient Name: Gary Hayes MRN: [id number] DOB:1976-07-18, 46 y.o., male Today's Date: 11/24/2021   PT End of Session - 11/24/21 1508     Visit Number 2    Number of Visits 33    Date for PT Re-Evaluation 03/10/22    Authorization Type U[employer]    PT Start Time 1515    PT Stop Time 1544    PT Time Calculation (min) 29 min    Activity Tolerance Patient tolerated treatment well    Behavior During Therapy WFL for tasks assessed/performed              Past Medical History:  Diagnosis Date   Arthritis    bilateral shoulders   Chicken pox    Frequent headaches    GERD (gastroesophageal reflux disease)    Hay fever    Past Surgical History:  Procedure Laterality Date   SHOULDER ARTHROSCOPY WITH LABRAL REPAIR Left 11/15/2021   Procedure: left SHOULDER ARTHROSCOPY WITH LABRAL REPAIR;  Surgeon: [name], MD;  Location: [facility];  Service: Orthopedics;  Laterality: Left;   WISDOM TOOTH EXTRACTION     Patient Active Problem List   Diagnosis Date Noted   Labral tear of shoulder, degenerative, left    Plantar fasciitis 12/20/2018   Tightness of right gastrocnemius muscle 12/20/2018   Shoulder separation 12/20/2018    PCP: Deeann Saint, MD  REFERRING PROVIDER: Deeann Saint, MD  REFERRING DIAG: s/p Labral repair  THERAPY DIAG:  Acute pain of left shoulder  Stiffness of left shoulder, not elsewhere classified   ONSET DATE: 11/15/21-surgery  SUBJECTIVE:                                                                                                                                                                                      SUBJECTIVE STATEMENT: Pt states the pain is good today. He has had no issues with previous HEP.   PERTINENT HISTORY: none  PAIN:  Are you having pain? Yes NPRS scale:2/10 Pain location: Lt shoulder PAIN TYPE: aching  Aggravating factors: being up and around Relieving factors: ice,  meds  PRECAUTIONS: Shoulder  WEIGHT BEARING RESTRICTIONS Yes labral repair   OCCUPATION: Engineer, mostly at a computer   PATIENT GOALS exercise, sports- basketball, running, baseball  OBJECTIVE:  PATIENT SURVEYS:  FOTO 30    TODAY'S TREATMENT:  PROM to protocol limits wks 1-4: FF 90, 20 ER, 45 ABD, IR to belly, no cross body adduction  Exercises AAROM flexion supine 2s 10x Standing Isometric Shoulder Internal Rotation at Doorway -  1 sets - 10 reps - 3 hold Standing Isometric Shoulder External  Rotation with Doorway -  1 sets - 10 reps - 3 hold Standing Isometric Shoulder Abduction with Doorway - Arm Bent - - 1 sets - 10 reps - 3 hold Standing Isometric Shoulder Extension with Doorway - - 1 sets - 10 reps - 3 hold Seated Gripping Towel -  - 2 sets - 20 reps Elbow Flexion PROM -  1 sets - 10 reps   PATIENT EDUCATION: Education details: anatomy, clot preventing, thermotherapy, hourly movement breaks, exercise progression, DOMS expectations, muscle firing,  envelope of function, HEP, POC Person educated: Patient Education method: Explanation, Demonstration, Tactile cues, Verbal cues, and Handouts Education comprehension: verbalized understanding, returned demonstration, verbal cues required, tactile cues required, and needs further education   HOME EXERCISE PROGRAM: Access Code: [email or web address] URL: [email or web address] Date: 11/24/2021 Prepared by: Zebedee Iba   ASSESSMENT:  CLINICAL IMPRESSION: Pt with good tolerance to follow up treatment session. Pt able to reach ROM limitations with AAROM at today's session. Pt's pain is very well controlled during session and able to add in isometrics within the sling. Pt HEP updated. Supine AAROM flexion held on HEP until assessment for response at next session. Plan to progress per protocol. Possibly trial manual resisted scap motions at next sessions.   Objective impairments include decreased ROM, decreased strength,  increased muscle spasms, impaired flexibility, impaired UE functional use, improper body mechanics, postural dysfunction, and pain. These impairments are limiting patient from cleaning, meal prep, occupation, yard work, and physical fitness . Personal factors including 1 comorbidity: h/o shoulder separation  are also affecting patient's functional outcome. Patient will benefit from skilled PT to address above impairments and improve overall function.  REHAB POTENTIAL: Good  CLINICAL DECISION MAKING: Stable/uncomplicated  EVALUATION COMPLEXITY: Low   GOALS: Goals reviewed with patient? Yes  SHORT TERM GOALS:  STG Name Target Date Goal status  1 Passive & AA flexion to 90 without increased pain Baseline: unable at eval 12/22/2021 INITIAL  2 Demo proper activation of periscapular musculature while performing isometrics Baseline: will progress into these exercises as appropriate 12/22/2021 INITIAL  3 Pt will verbalize compliance with & understanding of restrictions while performing ADLs Baseline:will educate as appropriate 12/22/2021 INITIAL                       LONG TERM GOALS:   LTG Name Target Date Goal status  1 Flexion to 160 with minimal to no discomfort Baseline: no motion at eval 01/19/2022 INITIAL  2 Able to reach center belt loop behind back without compensation Baseline:unable at eval 01/19/2022 INITIAL  3 Able to demo scapular control in lifting and maneuvering 5lb weight at and above shoulder height Baseline:unable at eval 02/16/2022 INITIAL  4 Will begin plyometric and CKC exercises  Baseline: will progress as appropriate 02/16/2022 INITIAL  5 PT and pt will collaborate to create further objective goals  Baseline: to be set at later date 02/16/2022 INITIAL             PLAN: PT FREQUENCY: 1-2x/week  PT DURATION: 12 weeks  PLANNED INTERVENTIONS: Therapeutic exercises, Therapeutic activity, Neuro Muscular re-education, Patient/Family education, Joint mobilization, Aquatic  Therapy, Dry Needling, Electrical stimulation, Cryotherapy, Moist heat, scar mobilization, Taping, Vasopneumatic device, and Manual therapy  PLAN FOR NEXT SESSION(s): PROM per protocol, periscap activation  Zebedee Iba PT, DPT 11/24/21 3:47 PM

## 2021-11-30 ENCOUNTER — Encounter (HOSPITAL_BASED_OUTPATIENT_CLINIC_OR_DEPARTMENT_OTHER): Payer: Self-pay | Admitting: Physical Therapy

## 2021-11-30 ENCOUNTER — Ambulatory Visit (INDEPENDENT_AMBULATORY_CARE_PROVIDER_SITE_OTHER): Payer: 59 | Admitting: Orthopaedic Surgery

## 2021-11-30 ENCOUNTER — Ambulatory Visit (HOSPITAL_BASED_OUTPATIENT_CLINIC_OR_DEPARTMENT_OTHER): Payer: 59 | Admitting: Physical Therapy

## 2021-11-30 ENCOUNTER — Other Ambulatory Visit: Payer: Self-pay

## 2021-11-30 DIAGNOSIS — M25512 Pain in left shoulder: Secondary | ICD-10-CM | POA: Diagnosis not present

## 2021-11-30 DIAGNOSIS — M25612 Stiffness of left shoulder, not elsewhere classified: Secondary | ICD-10-CM

## 2021-11-30 DIAGNOSIS — M24112 Other articular cartilage disorders, left shoulder: Secondary | ICD-10-CM

## 2021-11-30 NOTE — Therapy (Addendum)
OUTPATIENT PHYSICAL THERAPY TREATMENT   Patient Name: Gary Hayes MRN: [id number] DOB:11/18/1975, 46 y.o., male Today's Date: 11/30/2021   PT End of Session - 11/30/21 1637     Visit Number 3    Number of Visits 33    Date for PT Re-Evaluation 03/10/22    Authorization Type U[employer]    PT Start Time 0930    PT Stop Time 1012    PT Time Calculation (min) 42 min    Activity Tolerance Patient tolerated treatment well    Behavior During Therapy WFL for tasks assessed/performed               Past Medical History:  Diagnosis Date   Arthritis    bilateral shoulders   Chicken pox    Frequent headaches    GERD (gastroesophageal reflux disease)    Hay fever    Past Surgical History:  Procedure Laterality Date   SHOULDER ARTHROSCOPY WITH LABRAL REPAIR Left 11/15/2021   Procedure: left SHOULDER ARTHROSCOPY WITH LABRAL REPAIR;  Surgeon: Vanetta Mulders, MD;  Location: [facility];  Service: Orthopedics;  Laterality: Left;   WISDOM TOOTH EXTRACTION     Patient Active Problem List   Diagnosis Date Noted   Labral tear of shoulder, degenerative, left    Plantar fasciitis 12/20/2018   Tightness of right gastrocnemius muscle 12/20/2018   Shoulder separation 12/20/2018    PCP: Billie Ruddy, MD  REFERRING PROVIDER: Billie Ruddy, MD  REFERRING DIAG: s/p Labral repair  THERAPY DIAG:  Acute pain of left shoulder  Stiffness of left shoulder, not elsewhere classified   ONSET DATE: 11/15/21-surgery  SUBJECTIVE:                                                                                                                                                                                      SUBJECTIVE STATEMENT: Pt continues to have no issues. He reports minor soreness with his exercises   PERTINENT HISTORY: none  PAIN:  Are you having pain? Yes 2/8  NPRS scale:0/10 Pain location: Lt shoulder PAIN TYPE: aching  Aggravating factors: being up and around Relieving factors:  ice, meds  PRECAUTIONS: Shoulder  WEIGHT BEARING RESTRICTIONS Yes labral repair   OCCUPATION: Engineer, mostly at a computer   PATIENT GOALS exercise, sports- basketball, running, baseball  OBJECTIVE:  PATIENT SURVEYS:  FOTO 30    TODAY'S TREATMENT:  2/8 PROM to protocol limits wks 1-4: FF 90, 20 ER, 45 ABD, IR to belly, no cross body adduction  Elbow flexion and extension x20   Standing Isometric Shoulder Internal Rotation at Doorway -  1 sets - 10 reps - 3 hold Standing  Isometric Shoulder External Rotation with Doorway -  1 sets - 10 reps - 3 hold Standing Isometric Shoulder Abduction with Doorway - Arm Bent - - 1 sets - 10 reps - 3 hold Standing Isometric Shoulder Extension with Doorway - - 1 sets - 10 reps - 3 hold  Last treatment:   PROM to protocol limits wks 1-4: FF 90, 20 ER, 45 ABD, IR to belly, no cross body adduction Seated Gripping Towel -  - 2 sets - 20 reps  Standing Isometric Shoulder Internal Rotation at Doorway -  1 sets - 10 reps - 3 hold Standing Isometric Shoulder External Rotation with Doorway -  1 sets - 10 reps - 3 hold Standing Isometric Shoulder Abduction with Doorway - Arm Bent - - 1 sets - 10 reps - 3 hold Standing Isometric Shoulder Extension with Doorway - - 1 sets - 10 reps - 3 hold  Exercises AAROM flexion supine 2s 10x Standing Isometric Shoulder Internal Rotation at Doorway -  1 sets - 10 reps - 3 hold Standing Isometric Shoulder External Rotation with Doorway -  1 sets - 10 reps - 3 hold Standing Isometric Shoulder Abduction with Doorway - Arm Bent - - 1 sets - 10 reps - 3 hold Standing Isometric Shoulder Extension with Doorway - - 1 sets - 10 reps - 3 hold Seated Gripping Towel -  - 2 sets - 20 reps Elbow Flexion PROM -  1 sets - 10 reps   PATIENT EDUCATION: Education details: anatomy, clot preventing, thermotherapy, hourly movement breaks, exercise progression, DOMS expectations, muscle firing,  envelope of function, HEP,  POC Person educated: Patient Education method: Explanation, Demonstration, Tactile cues, Verbal cues, and Handouts Education comprehension: verbalized understanding, returned demonstration, verbal cues required, tactile cues required, and needs further education   HOME EXERCISE PROGRAM: Access Code: [email or web address] URL: [email or web address] Date: 11/24/2021 Prepared by: Daleen Bo   ASSESSMENT:  CLINICAL IMPRESSION: The patients passive motion is approaching his 4 week protocol limites. His ER and IR are slightly passed without resistance at end range. We will continue to progress as tolerated. He had no pain with shoulder flexion. He tolerated isometrics well as well.   Objective impairments include decreased ROM, decreased strength, increased muscle spasms, impaired flexibility, impaired UE functional use, improper body mechanics, postural dysfunction, and pain. These impairments are limiting patient from cleaning, meal prep, occupation, yard work, and physical fitness . Personal factors including 1 comorbidity: h/o shoulder separation  are also affecting patient's functional outcome. Patient will benefit from skilled PT to address above impairments and improve overall function.  REHAB POTENTIAL: Good  CLINICAL DECISION MAKING: Stable/uncomplicated  EVALUATION COMPLEXITY: Low   GOALS: Goals reviewed with patient? Yes  SHORT TERM GOALS:  STG Name Target Date Goal status  1 Passive & AA flexion to 90 without increased pain Baseline: unable at eval 12/28/2021 INITIAL  2 Demo proper activation of periscapular musculature while performing isometrics Baseline: will progress into these exercises as appropriate 12/28/2021 INITIAL  3 Pt will verbalize compliance with & understanding of restrictions while performing ADLs Baseline:will educate as appropriate 12/28/2021 INITIAL                       LONG TERM GOALS:   LTG Name Target Date Goal status  1 Flexion to 160 with  minimal to no discomfort Baseline: no motion at eval 01/25/2022 INITIAL  2 Able to reach center belt loop behind back without compensation Baseline:unable at eval 01/25/2022  INITIAL  3 Able to demo scapular control in lifting and maneuvering 5lb weight at and above shoulder height Baseline:unable at eval 02/22/2022 INITIAL  4 Will begin plyometric and CKC exercises  Baseline: will progress as appropriate 02/22/2022 INITIAL  5 PT and pt will collaborate to create further objective goals  Baseline: to be set at later date 02/22/2022 INITIAL             PLAN: PT FREQUENCY: 1-2x/week  PT DURATION: 12 weeks  PLANNED INTERVENTIONS: Therapeutic exercises, Therapeutic activity, Neuro Muscular re-education, Patient/Family education, Joint mobilization, Aquatic Therapy, Dry Needling, Electrical stimulation, Cryotherapy, Moist heat, scar mobilization, Taping, Vasopneumatic device, and Manual therapy  PLAN FOR NEXT SESSION(s): PROM per protocol, periscap activation  Daleen Bo PT, DPT 11/30/21 4:50 PM

## 2021-11-30 NOTE — Progress Notes (Signed)
Post Operative Evaluation    Procedure/Date of Surgery: Left shoulder labral repair 11/15/21  Interval History:   Patient presents 2 weeks status post left shoulder arthroscopy labral repair.  Overall he is doing very well.  Pain is been very well controlled.  Has been compliant with aspirin usage.  He has not required any oxycodone.  He is taking Tylenol and ibuprofen.  He has begun physical therapy and his work which has gone down.   PMH/PSH/Family History/Social History/Meds/Allergies:    Past Medical History:  Diagnosis Date   Arthritis    bilateral shoulders   Chicken pox    Frequent headaches    GERD (gastroesophageal reflux disease)    Hay fever    Past Surgical History:  Procedure Laterality Date   SHOULDER ARTHROSCOPY WITH LABRAL REPAIR Left 11/15/2021   Procedure: left SHOULDER ARTHROSCOPY WITH LABRAL REPAIR;  Surgeon: [name], MD;  Location: [facility];  Service: Orthopedics;  Laterality: Left;   WISDOM TOOTH EXTRACTION     Social History   Socioeconomic History   Marital status: Married    Spouse name: Not on file   Number of children: Not on file   Years of education: Not on file   Highest education level: Bachelor's degree (e.g., BA, AB, BS)  Occupational History   Not on file  Tobacco Use   Smoking status: Never   Smokeless tobacco: Never  Vaping Use   Vaping Use: Never used  Substance and Sexual Activity   Alcohol use: Never    Alcohol/week: 0.0 standard drinks   Drug use: Never   Sexual activity: Not on file  Other Topics Concern   Not on file  Social History Narrative   Not on file   Social Determinants of Health   Financial Resource Strain: Low Risk    Difficulty of Paying Living Expenses: Not hard at all  Food Insecurity: No Food Insecurity   Worried About Programme researcher, broadcasting/film/video in the Last Year: Never true   Ran Out of Food in the Last Year: Never true  Transportation Needs: No Transportation Needs    Lack of Transportation (Medical): No   Lack of Transportation (Non-Medical): No  Physical Activity: Insufficiently Active   Days of Exercise per Week: 2 days   Minutes of Exercise per Session: 30 min  Stress: No Stress Concern Present   Feeling of Stress : Only a little  Social Connections: Unknown   Frequency of Communication with Friends and Family: Patient refused   Frequency of Social Gatherings with Friends and Family: Patient refused   Attends Religious Services: Patient refused   Database administrator or Organizations: No   Attends Engineer, structural: Not on file   Marital Status: Married   Family History  Problem Relation Age of Onset   Diabetes Mother    Cancer Father    Heart disease Paternal Grandmother    No Known Allergies Current Outpatient Medications  Medication Sig Dispense Refill   aspirin EC 325 MG tablet Take 1 tablet (325 mg total) by mouth daily. 30 tablet 0   cholecalciferol (VITAMIN D3) 25 MCG (1000 UNIT) tablet Take 2,000 Units by mouth daily.     loratadine (CLARITIN) 10 MG tablet Take 10 mg by mouth daily.     oxycodone (OXY-IR) 5 MG capsule  Take 1 capsule (5 mg total) by mouth every 4 (four) hours as needed (severe pain). 20 capsule 0   Pseudoephedrine HCl (SUDAFED SINUS CONGESTION 24HR) 240 MG TB24 Take 240 mg by mouth daily as needed (congestion).     No current facility-administered medications for this visit.   No results found.  Review of Systems:   A ROS was performed including pertinent positives and negatives as documented in the HPI.   Musculoskeletal Exam:    There were no vitals taken for this visit.  Left shoulder arthroscopic incisions are well-healed.  No erythema or drainage.  Sensation is intact in all distributions of the left arm.  Is able to flex and extend at the left wrist and elbow.  2+ radial pulse  Imaging:    None  I personally reviewed and interpreted the radiographs.   Assessment:   46 year old male  2-week status post left shoulder arthroscopy with labral repair overall doing extremely well.  At this time I would like him to begin passive range of motion with physical therapy he will advance per protocol  Plan :    -Return to clinic 4 weeks      I personally saw and evaluated the patient, and participated in the management and treatment plan.  [name], MD Attending Physician, Orthopedic Surgery  This document was dictated using Dragon voice recognition software. A reasonable attempt at proof reading has been made to minimize errors.

## 2021-12-09 ENCOUNTER — Ambulatory Visit (HOSPITAL_BASED_OUTPATIENT_CLINIC_OR_DEPARTMENT_OTHER): Payer: 59 | Admitting: Physical Therapy

## 2021-12-09 ENCOUNTER — Encounter (HOSPITAL_BASED_OUTPATIENT_CLINIC_OR_DEPARTMENT_OTHER): Payer: Self-pay | Admitting: Physical Therapy

## 2021-12-09 ENCOUNTER — Other Ambulatory Visit: Payer: Self-pay

## 2021-12-09 DIAGNOSIS — M25512 Pain in left shoulder: Secondary | ICD-10-CM | POA: Diagnosis not present

## 2021-12-09 DIAGNOSIS — M25612 Stiffness of left shoulder, not elsewhere classified: Secondary | ICD-10-CM

## 2021-12-09 NOTE — Therapy (Signed)
OUTPATIENT PHYSICAL THERAPY TREATMENT   Patient Name: Gary Hayes MRN: [id number] DOB:08/12/1976, 46 y.o., male Today's Date: 12/09/2021   PT End of Session - 12/09/21 0900     Visit Number 4    Number of Visits 33    Date for PT Re-Evaluation 03/10/22    Authorization Type [employer]    PT Start Time 0845    PT Stop Time 0913    PT Time Calculation (min) 28 min    Activity Tolerance Patient tolerated treatment well    Behavior During Therapy WFL for tasks assessed/performed                Past Medical History:  Diagnosis Date   Arthritis    bilateral shoulders   Chicken pox    Frequent headaches    GERD (gastroesophageal reflux disease)    Hay fever    Past Surgical History:  Procedure Laterality Date   SHOULDER ARTHROSCOPY WITH LABRAL REPAIR Left 11/15/2021   Procedure: left SHOULDER ARTHROSCOPY WITH LABRAL REPAIR;  Surgeon: [name], MD;  Location: [facility] OR;  Service: Orthopedics;  Laterality: Left;   WISDOM TOOTH EXTRACTION     Patient Active Problem List   Diagnosis Date Noted   Labral tear of shoulder, degenerative, left    Plantar fasciitis 12/20/2018   Tightness of right gastrocnemius muscle 12/20/2018   Shoulder separation 12/20/2018    PCP: Deeann Saint, MD  REFERRING PROVIDER: Deeann Saint, MD  REFERRING DIAG: s/p Labral repair  THERAPY DIAG:  Acute pain of left shoulder  Stiffness of left shoulder, not elsewhere classified   ONSET DATE: 11/15/21-surgery  SUBJECTIVE:                                                                                                                                                                                      SUBJECTIVE STATEMENT: Pt states that the shoulder was slightly sore after last session. He has no pain at rest. Pt states that quick movements will occasionally cause discomfort.  PERTINENT HISTORY: none  PAIN:  Are you having pain? No  NPRS scale:0/10 Pain location: Lt shoulder PAIN  TYPE: aching  Aggravating factors: being up and around Relieving factors: ice, meds  PRECAUTIONS: Shoulder  WEIGHT BEARING RESTRICTIONS Yes labral repair   OCCUPATION: Engineer, mostly at a computer   PATIENT GOALS exercise, sports- basketball, running, baseball  OBJECTIVE:  PATIENT SURVEYS:  FOTO 30    TODAY'S TREATMENT:   2/16 PROM to protocol limits wks 1-4: FF 90, 20 ER, 45 ABD, IR to belly, no cross body adduction  Self supine AAROM flexion 3s 10x to 90 Post and ant shoulder  rolls 20x AROM flexion to 45 deg AROM IR to belly, ER to neutral shoulder at 0 Elbow flexion and extension x20   Standing Isometric Shoulder Internal Rotation at Doorway -  1 sets - 10 reps - 3 hold Standing Isometric Shoulder External Rotation with Doorway -  1 sets - 10 reps - 3 hold Standing Isometric Shoulder Abduction with Doorway - Arm Bent - - 1 sets - 10 reps - 3 hold Standing Isometric Shoulder Extension with Doorway - - 1 sets - 10 reps - 3 hold  2/8 PROM to protocol limits wks 1-4: FF 90, 20 ER, 45 ABD, IR to belly, no cross body adduction  Elbow flexion and extension x20   Standing Isometric Shoulder Internal Rotation at Doorway -  1 sets - 10 reps - 3 hold Standing Isometric Shoulder External Rotation with Doorway -  1 sets - 10 reps - 3 hold Standing Isometric Shoulder Abduction with Doorway - Arm Bent - - 1 sets - 10 reps - 3 hold Standing Isometric Shoulder Extension with Doorway - - 1 sets - 10 reps - 3 hold  Last treatment:   PROM to protocol limits wks 1-4: FF 90, 20 ER, 45 ABD, IR to belly, no cross body adduction Seated Gripping Towel -  - 2 sets - 20 reps  Standing Isometric Shoulder Internal Rotation at Doorway -  1 sets - 10 reps - 3 hold Standing Isometric Shoulder External Rotation with Doorway -  1 sets - 10 reps - 3 hold Standing Isometric Shoulder Abduction with Doorway - Arm Bent - - 1 sets - 10 reps - 3 hold Standing Isometric Shoulder Extension with  Doorway - - 1 sets - 10 reps - 3 hold  Exercises AAROM flexion supine 2s 10x Standing Isometric Shoulder Internal Rotation at Doorway -  1 sets - 10 reps - 3 hold Standing Isometric Shoulder External Rotation with Doorway -  1 sets - 10 reps - 3 hold Standing Isometric Shoulder Abduction with Doorway - Arm Bent - - 1 sets - 10 reps - 3 hold Standing Isometric Shoulder Extension with Doorway - - 1 sets - 10 reps - 3 hold Seated Gripping Towel -  - 2 sets - 20 reps Elbow Flexion PROM -  1 sets - 10 reps   PATIENT EDUCATION: Education details: anatomy, hourly movement breaks, exercise progression, DOMS expectations, muscle firing,  envelope of function, HEP, POC Person educated: Patient Education method: Explanation, Demonstration, Tactile cues, Verbal cues, and Handouts Education comprehension: verbalized understanding, returned demonstration, verbal cues required, tactile cues required, and needs further education   HOME EXERCISE PROGRAM: Access Code: [email or web address] URL: [email or web address] Date: 11/24/2021 Prepared by: Zebedee Iba   ASSESSMENT:  CLINICAL IMPRESSION: Pt is able to reach protocol end range with PROM at this time. Pt was able to introduce gentle AROM flexion and IR/ER within limits without pain. Pt doing very well with well managed pain. Plan to proceed per protocol. Pt is 3 weeks at this time.    Objective impairments include decreased ROM, decreased strength, increased muscle spasms, impaired flexibility, impaired UE functional use, improper body mechanics, postural dysfunction, and pain. These impairments are limiting patient from cleaning, meal prep, occupation, yard work, and physical fitness . Personal factors including 1 comorbidity: h/o shoulder separation  are also affecting patient's functional outcome. Patient will benefit from skilled PT to address above impairments and improve overall function.  REHAB POTENTIAL: Good  CLINICAL DECISION MAKING:  Stable/uncomplicated  EVALUATION COMPLEXITY: Low  GOALS: Goals reviewed with patient? Yes  SHORT TERM GOALS:  STG Name Target Date Goal status  1 Passive & AA flexion to 90 without increased pain Baseline: unable at eval 01/06/2022 INITIAL  2 Demo proper activation of periscapular musculature while performing isometrics Baseline: will progress into these exercises as appropriate 01/06/2022 INITIAL  3 Pt will verbalize compliance with & understanding of restrictions while performing ADLs Baseline:will educate as appropriate 01/06/2022 INITIAL                       LONG TERM GOALS:   LTG Name Target Date Goal status  1 Flexion to 160 with minimal to no discomfort Baseline: no motion at eval 02/03/2022 INITIAL  2 Able to reach center belt loop behind back without compensation Baseline:unable at eval 02/03/2022 INITIAL  3 Able to demo scapular control in lifting and maneuvering 5lb weight at and above shoulder height Baseline:unable at eval 03/03/2022 INITIAL  4 Will begin plyometric and CKC exercises  Baseline: will progress as appropriate 03/03/2022 INITIAL  5 PT and pt will collaborate to create further objective goals  Baseline: to be set at later date 03/03/2022 INITIAL             PLAN: PT FREQUENCY: 1-2x/week  PT DURATION: 12 weeks  PLANNED INTERVENTIONS: Therapeutic exercises, Therapeutic activity, Neuro Muscular re-education, Patient/Family education, Joint mobilization, Aquatic Therapy, Dry Needling, Electrical stimulation, Cryotherapy, Moist heat, scar mobilization, Taping, Vasopneumatic device, and Manual therapy  PLAN FOR NEXT SESSION(s): PROM per protocol, periscap activation  Zebedee Iba PT, DPT 12/09/21 9:17 AM

## 2021-12-15 NOTE — Therapy (Signed)
OUTPATIENT PHYSICAL THERAPY TREATMENT   Patient Name: Gary Hayes MRN: [id number] DOB:10-Jun-1976, 46 y.o., male Today's Date: 12/16/2021   PT End of Session - 12/16/21 0848     Visit Number 5    Number of Visits 33    Date for PT Re-Evaluation 03/10/22    Authorization Type U[employer]    PT Start Time 0845    PT Stop Time 0924    PT Time Calculation (min) 39 min    Activity Tolerance Patient tolerated treatment well    Behavior During Therapy [facility] for tasks assessed/performed                 Past Medical History:  Diagnosis Date   Arthritis    bilateral shoulders   Chicken pox    Frequent headaches    GERD (gastroesophageal reflux disease)    Hay fever    Past Surgical History:  Procedure Laterality Date   SHOULDER ARTHROSCOPY WITH LABRAL REPAIR Left 11/15/2021   Procedure: left SHOULDER ARTHROSCOPY WITH LABRAL REPAIR;  Surgeon: [name], MD;  Location: [facility] OR;  Service: Orthopedics;  Laterality: Left;   WISDOM TOOTH EXTRACTION     Patient Active Problem List   Diagnosis Date Noted   Labral tear of shoulder, degenerative, left    Plantar fasciitis 12/20/2018   Tightness of right gastrocnemius muscle 12/20/2018   Shoulder separation 12/20/2018    PCP: Deeann Saint, MD  REFERRING PROVIDER: [name], MD  REFERRING DIAG: s/p Labral repair  THERAPY DIAG:  Acute pain of left shoulder  Stiffness of left shoulder, not elsewhere classified   ONSET DATE: 11/15/21-surgery  SUBJECTIVE:                                                                                                                                                                                      SUBJECTIVE STATEMENT: Overall feeling good. Took the pillow out.  PERTINENT HISTORY: none  PAIN:  Are you having pain? No  NPRS scale:0/10 Pain location: Lt shoulder PAIN TYPE: aching  Aggravating factors: being up and around Relieving factors: ice, meds  PRECAUTIONS:  Shoulder  WEIGHT BEARING RESTRICTIONS Yes labral repair   OCCUPATION: Engineer, mostly at a computer   PATIENT GOALS exercise, sports- basketball, running, baseball  OBJECTIVE:  PATIENT SURVEYS:  FOTO 30 UPPER EXTREMITY AROM/PROM:   A/PROM Right 12/16/21  Shoulder flexion 95 PROM /110 AAROM/ 55 AROM  Shoulder extension    Shoulder abduction 45 AROM   Shoulder adduction    Shoulder internal rotation  38  Shoulder external rotation 12   Elbow flexion    Elbow extension    Wrist flexion  Wrist extension    Wrist ulnar deviation    Wrist radial deviation    Wrist pronation    Wrist supination    (Blank rows = not tested)    TODAY'S TREATMENT:   2/24:  PROM all motions per protocol Limits: FF&ABD 160, ER 45, IR behind back to waist; Post & Inf mobs grade 2 AAROM flexion with dowel- chest press & 90-end range IR behind back with towel Standing AROM- flexion, abduction Upper trap & levator stretch  2/16 PROM to protocol limits wks 1-4: FF 90, 20 ER, 45 ABD, IR to belly, no cross body adduction  Self supine AAROM flexion 3s 10x to 90 Post and ant shoulder rolls 20x AROM flexion to 45 deg AROM IR to belly, ER to neutral shoulder at 0 Elbow flexion and extension x20   Standing Isometric Shoulder Internal Rotation at Doorway -  1 sets - 10 reps - 3 hold Standing Isometric Shoulder External Rotation with Doorway -  1 sets - 10 reps - 3 hold Standing Isometric Shoulder Abduction with Doorway - Arm Bent - - 1 sets - 10 reps - 3 hold Standing Isometric Shoulder Extension with Doorway - - 1 sets - 10 reps - 3 hold   PATIENT EDUCATION: Education details: protocol progression, exercise form/rationale Person educated: Patient Education method: Explanation, Demonstration, Tactile cues, Verbal cues, and Handouts Education comprehension: verbalized understanding, returned demonstration, verbal cues required, tactile cues required, and needs further education   HOME  EXERCISE PROGRAM: Access Code: [email or web address] URL: [email or web address]    ASSESSMENT:  CLINICAL IMPRESSION: Progressed into next phase of protocol. Some complaints of pinching at end range of passive motion, decreased with mobs. Progressed HEP to add AA&AROM for strengthening and encouraged use of mirror for VC.    Objective impairments include decreased ROM, decreased strength, increased muscle spasms, impaired flexibility, impaired UE functional use, improper body mechanics, postural dysfunction, and pain. These impairments are limiting patient from cleaning, meal prep, occupation, yard work, and physical fitness . Personal factors including 1 comorbidity: h/o shoulder separation  are also affecting patient's functional outcome. Patient will benefit from skilled PT to address above impairments and improve overall function.  REHAB POTENTIAL: Good  CLINICAL DECISION MAKING: Stable/uncomplicated  EVALUATION COMPLEXITY: Low   GOALS: Goals reviewed with patient? Yes  SHORT TERM GOALS:  STG Name Target Date Goal status  1 Passive & AA flexion to 90 without increased pain Baseline: unable at eval 01/13/2022 INITIAL  2 Demo proper activation of periscapular musculature while performing isometrics Baseline: will progress into these exercises as appropriate 01/13/2022 INITIAL  3 Pt will verbalize compliance with & understanding of restrictions while performing ADLs Baseline:will educate as appropriate 01/13/2022 INITIAL                       LONG TERM GOALS:   LTG Name Target Date Goal status  1 Flexion to 160 with minimal to no discomfort Baseline: no motion at eval 02/10/2022 INITIAL  2 Able to reach center belt loop behind back without compensation Baseline:unable at eval 02/10/2022 INITIAL  3 Able to demo scapular control in lifting and maneuvering 5lb weight at and above shoulder height Baseline:unable at eval 03/10/2022 INITIAL  4 Will begin plyometric and CKC  exercises  Baseline: will progress as appropriate 03/10/2022 INITIAL  5 PT and pt will collaborate to create further objective goals  Baseline: to be set at later date 03/10/2022 INITIAL  PLAN: PT FREQUENCY: 1-2x/week  PT DURATION: 12 weeks  PLANNED INTERVENTIONS: Therapeutic exercises, Therapeutic activity, Neuro Muscular re-education, Patient/Family education, Joint mobilization, Aquatic Therapy, Dry Needling, Electrical stimulation, Cryotherapy, Moist heat, scar mobilization, Taping, Vasopneumatic device, and Manual therapy  PLAN FOR NEXT SESSION(s): add bands if AROM went well, pulleys  Bexley Mclester C. Jouri Threat PT, DPT 12/16/21 9:26 AM

## 2021-12-16 ENCOUNTER — Ambulatory Visit (HOSPITAL_BASED_OUTPATIENT_CLINIC_OR_DEPARTMENT_OTHER): Payer: 59 | Admitting: Physical Therapy

## 2021-12-16 ENCOUNTER — Encounter (HOSPITAL_BASED_OUTPATIENT_CLINIC_OR_DEPARTMENT_OTHER): Payer: Self-pay | Admitting: Physical Therapy

## 2021-12-16 ENCOUNTER — Other Ambulatory Visit: Payer: Self-pay

## 2021-12-16 DIAGNOSIS — M25512 Pain in left shoulder: Secondary | ICD-10-CM | POA: Diagnosis not present

## 2021-12-16 DIAGNOSIS — M25612 Stiffness of left shoulder, not elsewhere classified: Secondary | ICD-10-CM

## 2021-12-19 ENCOUNTER — Encounter (HOSPITAL_BASED_OUTPATIENT_CLINIC_OR_DEPARTMENT_OTHER): Payer: Self-pay | Admitting: Physical Therapy

## 2021-12-19 ENCOUNTER — Other Ambulatory Visit: Payer: Self-pay

## 2021-12-19 ENCOUNTER — Ambulatory Visit (HOSPITAL_BASED_OUTPATIENT_CLINIC_OR_DEPARTMENT_OTHER): Payer: 59 | Admitting: Physical Therapy

## 2021-12-19 DIAGNOSIS — M25512 Pain in left shoulder: Secondary | ICD-10-CM | POA: Diagnosis not present

## 2021-12-19 DIAGNOSIS — M25612 Stiffness of left shoulder, not elsewhere classified: Secondary | ICD-10-CM

## 2021-12-19 NOTE — Therapy (Signed)
OUTPATIENT PHYSICAL THERAPY TREATMENT   Patient Name: Gary Hayes MRN: [id number] DOB:Mar 16, 1976, 46 y.o., male Today's Date: 12/19/2021   PT End of Session - 12/19/21 0910     Visit Number 6    Number of Visits 33    Date for PT Re-Evaluation 03/10/22    Authorization Type U[employer]    PT Start Time 0845    PT Stop Time 0925    PT Time Calculation (min) 40 min    Activity Tolerance Patient tolerated treatment well    Behavior During Therapy [facility] for tasks assessed/performed                  Past Medical History:  Diagnosis Date   Arthritis    bilateral shoulders   Chicken pox    Frequent headaches    GERD (gastroesophageal reflux disease)    Hay fever    Past Surgical History:  Procedure Laterality Date   SHOULDER ARTHROSCOPY WITH LABRAL REPAIR Left 11/15/2021   Procedure: left SHOULDER ARTHROSCOPY WITH LABRAL REPAIR;  Surgeon: [name], MD;  Location: [facility] OR;  Service: Orthopedics;  Laterality: Left;   WISDOM TOOTH EXTRACTION     Patient Active Problem List   Diagnosis Date Noted   Labral tear of shoulder, degenerative, left    Plantar fasciitis 12/20/2018   Tightness of right gastrocnemius muscle 12/20/2018   Shoulder separation 12/20/2018    PCP: Deeann Saint, MD  REFERRING PROVIDER: [name], MD  REFERRING DIAG: s/p Labral repair  THERAPY DIAG:  Acute pain of left shoulder  Stiffness of left shoulder, not elsewhere classified   ONSET DATE: 11/15/21-surgery  SUBJECTIVE:                                                                                                                                                                                      SUBJECTIVE STATEMENT: Pt states the shoulder is sore after moving it more often at home but it responds well to ice and resting.   PERTINENT HISTORY: none  PAIN:  Are you having pain? No  NPRS scale:0/10 Pain location: Lt shoulder PAIN TYPE: aching  Aggravating factors: being up  and around Relieving factors: ice, meds  PRECAUTIONS: Shoulder  WEIGHT BEARING RESTRICTIONS Yes labral repair   OCCUPATION: Engineer, mostly at a computer   PATIENT GOALS exercise, sports- basketball, running, baseball  OBJECTIVE:  PATIENT SURVEYS:  FOTO 30 UPPER EXTREMITY AROM/PROM:   A/PROM Right 12/16/21  Shoulder flexion 95 PROM /110 AAROM/ 55 AROM  Shoulder extension    Shoulder abduction 45 AROM   Shoulder adduction    Shoulder internal rotation  38  Shoulder external rotation  12   Elbow flexion    Elbow extension    Wrist flexion    Wrist extension    Wrist ulnar deviation    Wrist radial deviation    Wrist pronation    Wrist supination    (Blank rows = not tested)    TODAY'S TREATMENT:   2/27:  (PROM protocol Limits: FF&ABD 160, ER 45, IR behind back to waist)  L GHJ Post & Inf mobs grade II-III Flexion to 130, ABD to 110, IR to belly, ER 30 deg- spasm/guarding at end range   AAROM flexion with dowel 3s 10x IR behind back with strap 2s 15x Standing AROM- flexion, abduction 15x each Pulley's scaption and ABD 3 min each   2/24:  PROM all motions per protocol Limits: FF&ABD 160, ER 45, IR behind back to waist; Post & Inf mobs grade 2 AAROM flexion with dowel- chest press & 90-end range IR behind back with towel Standing AROM- flexion, abduction Upper trap & levator stretch  2/16 PROM to protocol limits wks 1-4: FF 90, 20 ER, 45 ABD, IR to belly, no cross body adduction  Self supine AAROM flexion 3s 10x to 90 Post and ant shoulder rolls 20x AROM flexion to 45 deg AROM IR to belly, ER to neutral shoulder at 0 Elbow flexion and extension x20   Standing Isometric Shoulder Internal Rotation at Doorway -  1 sets - 10 reps - 3 hold Standing Isometric Shoulder External Rotation with Doorway -  1 sets - 10 reps - 3 hold Standing Isometric Shoulder Abduction with Doorway - Arm Bent - - 1 sets - 10 reps - 3 hold Standing Isometric Shoulder Extension  with Doorway - - 1 sets - 10 reps - 3 hold   PATIENT EDUCATION: Education details: protocol progression, exercise form/rationale Person educated: Patient Education method: Explanation, Demonstration, Tactile cues, Verbal cues, and Handouts Education comprehension: verbalized understanding, returned demonstration, verbal cues required, tactile cues required, and needs further education   HOME EXERCISE PROGRAM: Access Code: [email or web address] URL: [email or web address]    ASSESSMENT:  CLINICAL IMPRESSION: Pt able to continue per protocol with addition of AAROM pulleys. Pt with pinching sensation at end range that was again reduced with joint mobilizations. Pt especially tight into open pack and ABD position. Pt able to get up to 135 deg flex and 85 deg ABD with AAROM. Pt HEP updated to remove isometrics as he is beginning to do more A/AROM. Pt progressing well. Pt is more stiff dominant than pain dominant at this time. Resistance held due to discomfort and limited ROM with AROM.   Objective impairments include decreased ROM, decreased strength, increased muscle spasms, impaired flexibility, impaired UE functional use, improper body mechanics, postural dysfunction, and pain. These impairments are limiting patient from cleaning, meal prep, occupation, yard work, and physical fitness . Personal factors including 1 comorbidity: h/o shoulder separation  are also affecting patient's functional outcome. Patient will benefit from skilled PT to address above impairments and improve overall function.  REHAB POTENTIAL: Good  CLINICAL DECISION MAKING: Stable/uncomplicated  EVALUATION COMPLEXITY: Low   GOALS: Goals reviewed with patient? Yes  SHORT TERM GOALS:  STG Name Target Date Goal status  1 Passive & AA flexion to 90 without increased pain Baseline: unable at eval 01/16/2022 INITIAL  2 Demo proper activation of periscapular musculature while performing isometrics Baseline: will  progress into these exercises as appropriate 01/16/2022 INITIAL  3 Pt will verbalize compliance with & understanding of restrictions while performing ADLs Baseline:will educate  as appropriate 01/16/2022 INITIAL                       LONG TERM GOALS:   LTG Name Target Date Goal status  1 Flexion to 160 with minimal to no discomfort Baseline: no motion at eval 02/13/2022 INITIAL  2 Able to reach center belt loop behind back without compensation Baseline:unable at eval 02/13/2022 INITIAL  3 Able to demo scapular control in lifting and maneuvering 5lb weight at and above shoulder height Baseline:unable at eval 03/13/2022 INITIAL  4 Will begin plyometric and CKC exercises  Baseline: will progress as appropriate 03/13/2022 INITIAL  5 PT and pt will collaborate to create further objective goals  Baseline: to be set at later date 03/13/2022 INITIAL             PLAN: PT FREQUENCY: 1-2x/week  PT DURATION: 12 weeks  PLANNED INTERVENTIONS: Therapeutic exercises, Therapeutic activity, Neuro Muscular re-education, Patient/Family education, Joint mobilization, Aquatic Therapy, Dry Needling, Electrical stimulation, Cryotherapy, Moist heat, scar mobilization, Taping, Vasopneumatic device, and Manual therapy  PLAN FOR NEXT SESSION(s): revisit pulley (may have bought for home), work on ABD and flexion if still stiff  Zebedee Iba PT, DPT 12/19/21 9:26 AM

## 2021-12-23 ENCOUNTER — Other Ambulatory Visit: Payer: Self-pay

## 2021-12-23 ENCOUNTER — Encounter (HOSPITAL_BASED_OUTPATIENT_CLINIC_OR_DEPARTMENT_OTHER): Payer: Self-pay | Admitting: Physical Therapy

## 2021-12-23 ENCOUNTER — Ambulatory Visit (HOSPITAL_BASED_OUTPATIENT_CLINIC_OR_DEPARTMENT_OTHER): Payer: 59 | Attending: Orthopaedic Surgery | Admitting: Physical Therapy

## 2021-12-23 DIAGNOSIS — M25612 Stiffness of left shoulder, not elsewhere classified: Secondary | ICD-10-CM | POA: Insufficient documentation

## 2021-12-23 DIAGNOSIS — M25512 Pain in left shoulder: Secondary | ICD-10-CM | POA: Insufficient documentation

## 2021-12-23 NOTE — Therapy (Signed)
?OUTPATIENT PHYSICAL THERAPY TREATMENT ? ? ?Patient Name: Gary Hayes ?MRN: [id number] ?DOB:[birth year], 46 y.o., male ?Today's Date: 12/23/2021 ? ? PT End of Session - 12/23/21 0921   ? ? Visit Number 7   ? Number of Visits 33   ? Date for PT Re-Evaluation 03/10/22   ? Authorization Type UHC   ? PT Start Time 0845   ? PT Stop Time 0920   ? PT Time Calculation (min) 35 min   ? Activity Tolerance Patient tolerated treatment well   ? Behavior During Therapy [facility] for tasks assessed/performed   ? ?  ?  ? ?  ? ? ? ? ? ? ? ? ?Past Medical History:  ?Diagnosis Date  ? Arthritis   ? bilateral shoulders  ? Chicken pox   ? Frequent headaches   ? GERD (gastroesophageal reflux disease)   ? Hay fever   ? ?Past Surgical History:  ?Procedure Laterality Date  ? SHOULDER ARTHROSCOPY WITH LABRAL REPAIR Left 11/15/2021  ? Procedure: left SHOULDER ARTHROSCOPY WITH LABRAL REPAIR;  Surgeon: [name], MD;  Location: [facility] OR;  Service: Orthopedics;  Laterality: Left;  ? WISDOM TOOTH EXTRACTION    ? ?Patient Active Problem List  ? Diagnosis Date Noted  ? Labral tear of shoulder, degenerative, left   ? Plantar fasciitis 12/20/2018  ? Tightness of right gastrocnemius muscle 12/20/2018  ? Shoulder separation 12/20/2018  ? ? ?PCP: Deeann Saint, MD ? ?REFERRING PROVIDER: [name], MD ? ?REFERRING DIAG: s/p Labral repair ? ?THERAPY DIAG:  ?Acute pain of left shoulder ? ?Stiffness of left shoulder, not elsewhere classified ? ? ?ONSET DATE: 11/15/21-surgery ? ?SUBJECTIVE:                                                                                                                                                                                     ? ?SUBJECTIVE STATEMENT: ?Ptstates that the shoulder was sore after last visit but responded well to ice.  ? ?PERTINENT HISTORY: ?none ? ?PAIN:  ?Are you having pain? No  ?NPRS scale:0/10 ?Pain location: Lt shoulder ?PAIN TYPE: aching  ?Aggravating factors: being up and around ?Relieving  factors: ice, meds ? ?PRECAUTIONS: Shoulder ? ?WEIGHT BEARING RESTRICTIONS Yes labral repair ? ? 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 ? ?PATIENT GOALS exercise, sports- basketball, running, baseball ? ?OBJECTIVE:  ?PATIENT SURVEYS:  ?FOTO 30 ?UPPER EXTREMITY AROM/PROM: ?  ?A/PROM Right ?12/16/21  ?Shoulder flexion 95 PROM /110 AAROM/ 55 AROM  ?Shoulder extension    ?Shoulder abduction 45 AROM   ?Shoulder adduction    ?Shoulder internal rotation  38  ?Shoulder external rotation 12   ?Elbow flexion    ?  Elbow extension    ?Wrist flexion    ?Wrist extension    ?Wrist ulnar deviation    ?Wrist radial deviation    ?Wrist pronation    ?Wrist supination    ?(Blank rows = not tested) ? ?  ?TODAY'S TREATMENT:  ?3/3:  ?(PROM protocol Limits: FF&ABD 160, ER 45, IR behind back to waist) ? ?L GH[employer] Post & Inf mobs grade II-III ?AAROM supine flexion 2x10 ?Seated dowel ER 3s 10x ?AAROM dowel flexion and ABD 3s 10x ?Standing AROM- flexion, abduction 15x each ? ?2/27:  ?(PROM protocol Limits: FF&ABD 160, ER 45, IR behind back to waist) ? ?L GHJ Post & Inf mobs grade II-III ?Flexion to 130, ABD to 110, IR to belly, ER 30 deg- spasm/guarding at end range ? ? ?AAROM flexion with dowel 3s 10x ?IR behind back with strap 2s 15x ?Standing AROM- flexion, abduction 15x each ?Pulley's scaption and ABD 3 min each ? ? ?2/24:  ?PROM all motions per protocol Limits: FF&ABD 160, ER 45, IR behind back to waist; Post & Inf mobs grade 2 ?AAROM flexion with dowel- chest press & 90-end range ?IR behind back with towel ?Standing AROM- flexion, abduction ?Upper trap & levator stretch ? ?2/16 ?PROM to protocol limits wks 1-4: FF 90, 20 ER, 45 ABD, IR to belly, no cross body adduction ? ?Self supine AAROM flexion 3s 10x to 90 ?Post and ant shoulder rolls 20x ?AROM flexion to 45 deg ?AROM IR to belly, ER to neutral shoulder at 0 ?Elbow flexion and extension x20  ? ?Standing Isometric Shoulder Internal Rotation at Doorway -  1 sets - 10 reps - 3  hold ?Standing Isometric Shoulder External Rotation with Doorway -  1 sets - 10 reps - 3 hold ?Standing Isometric Shoulder Abduction with Doorway - Arm Bent - - 1 sets - 10 reps - 3 hold ?Standing Isometric Shoulder Extension with Doorway - - 1 sets - 10 reps - 3 hold ? ? ?PATIENT EDUCATION: ?Education details: protocol progression, exercise form/rationale ?Person educated: Patient ?Education method: Explanation, Demonstration, Tactile cues, Verbal cues, and Handouts ?Education comprehension: verbalized understanding, returned demonstration, verbal cues required, tactile cues required, and needs further education ? ? ?HOME EXERCISE PROGRAM: ?Access Code: [email or web address] ?URL: [email or web address] ?Date: 12/23/2021 ?Prepared by: Zebedee Iba ? ?Exercises ?Standing Shoulder Flexion to 90 Degrees - 2 x daily - 7 x weekly - 1 sets - 10 reps ?Shoulder Abduction - Thumbs Up - 2 x daily - 7 x weekly - 1 sets - 10 reps ?Supine Shoulder External Rotation with Dowel - 2 x daily - 7 x weekly - 1 sets - 10 reps - 3-5 hold ?Shoulder Flexion Overhead with Dowel - 2 x daily - 7 x weekly - 2 sets - 10 reps - 5 hold ?Standing Shoulder Abduction AAROM with Dowel - 2 x daily - 7 x weekly - 3 sets - 10 reps - 5 hold ? ? ? ? ?ASSESSMENT: ? ?CLINICAL IMPRESSION: ?Pt presents with increased joint stiffness at guarding at this session with pinching sensation above 90 deg even after mobilzation. Pt advised to hold on AROM at this time and focus more on CKC stretching/PROM exercise. Plan to continue per protocol. Pt will be 6 weeks next week.  ? ? ?Objective impairments include decreased ROM, decreased strength, increased muscle spasms, impaired flexibility, impaired UE functional use, improper body mechanics, postural dysfunction, and pain. These impairments are limiting patient from cleaning, meal prep, occupation, yard work, and physical fitness . Personal  factors including 1 comorbidity: h/o shoulder separation  are also affecting  patient's functional outcome. Patient will benefit from skilled PT to address above impairments and improve overall function. ? ?REHAB POTENTIAL: Good ? ?CLINICAL DECISION MAKING: Stable/uncomplicated ? ?EVALUATION COMPLEXITY: Low ? ? ?GOALS: ?Goals reviewed with patient? Yes ? ?SHORT TERM GOALS: ? ?STG Name Target Date Goal status  ?1 Passive & AA flexion to 90 without increased pain ?Baseline: unable at eval 01/20/2022 INITIAL  ?2 Demo proper activation of periscapular musculature while performing isometrics ?Baseline: will progress into these exercises as appropriate 01/20/2022 INITIAL  ?3 Pt will verbalize compliance with & understanding of restrictions while performing ADLs ?Baseline:will educate as appropriate 01/20/2022 INITIAL  ?     ?     ?     ?     ? ?LONG TERM GOALS:  ? ?LTG Name Target Date Goal status  ?1 Flexion to 160 with minimal to no discomfort ?Baseline: no motion at eval 02/17/2022 INITIAL  ?2 Able to reach center belt loop behind back without compensation ?Baseline:unable at eval 02/17/2022 INITIAL  ?3 Able to demo scapular control in lifting and maneuvering 5lb weight at and above shoulder height ?Baseline:unable at eval 03/17/2022 INITIAL  ?4 Will begin plyometric and CKC exercises  ?Baseline: will progress as appropriate 03/17/2022 INITIAL  ?5 PT and pt will collaborate to create further objective goals  ?Baseline: to be set at later date 03/17/2022 INITIAL  ?     ?     ? ?PLAN: ?PT FREQUENCY: 1-2x/week ? ?PT DURATION: 12 weeks ? ?PLANNED INTERVENTIONS: Therapeutic exercises, Therapeutic activity, Neuro Muscular re-education, Patient/Family education, Joint mobilization, Aquatic Therapy, Dry Needling, Electrical stimulation, Cryotherapy, Moist heat, scar mobilization, Taping, Vasopneumatic device, and Manual therapy ? ?PLAN FOR NEXT SESSION(s): revisit pulley (may have bought for home), work on ABD and flexion if still stiff ? ?Zebedee Iba PT, DPT ?12/23/21 9:22 AM ? ? ? ? ?

## 2021-12-26 ENCOUNTER — Ambulatory Visit (HOSPITAL_BASED_OUTPATIENT_CLINIC_OR_DEPARTMENT_OTHER): Payer: 59 | Admitting: Physical Therapy

## 2021-12-26 ENCOUNTER — Other Ambulatory Visit: Payer: Self-pay

## 2021-12-26 ENCOUNTER — Encounter (HOSPITAL_BASED_OUTPATIENT_CLINIC_OR_DEPARTMENT_OTHER): Payer: Self-pay | Admitting: Physical Therapy

## 2021-12-26 DIAGNOSIS — M25512 Pain in left shoulder: Secondary | ICD-10-CM

## 2021-12-26 DIAGNOSIS — M25612 Stiffness of left shoulder, not elsewhere classified: Secondary | ICD-10-CM

## 2021-12-26 NOTE — Therapy (Signed)
?OUTPATIENT PHYSICAL THERAPY TREATMENT ? ? ?Patient Name: Gary Hayes ?MRN: [id number] ?DOB:[birth year], 46 y.o., male ?Today's Date: 12/26/2021 ? ? PT End of Session - 12/26/21 0906   ? ? Visit Number 8   ? Number of Visits 33   ? Date for PT Re-Evaluation 03/10/22   ? Authorization Type UHC   ? PT Start Time 0845   ? PT Stop Time 0920   ? PT Time Calculation (min) 35 min   ? Activity Tolerance Patient tolerated treatment well   ? Behavior During Therapy [facility] for tasks assessed/performed   ? ?  ?  ? ?  ? ? ? ? ? ? ? ? ? ?Past Medical History:  ?Diagnosis Date  ? Arthritis   ? bilateral shoulders  ? Chicken pox   ? Frequent headaches   ? GERD (gastroesophageal reflux disease)   ? Hay fever   ? ?Past Surgical History:  ?Procedure Laterality Date  ? SHOULDER ARTHROSCOPY WITH LABRAL REPAIR Left 11/15/2021  ? Procedure: left SHOULDER ARTHROSCOPY WITH LABRAL REPAIR;  Surgeon: [name], MD;  Location: [facility] OR;  Service: Orthopedics;  Laterality: Left;  ? WISDOM TOOTH EXTRACTION    ? ?Patient Active Problem List  ? Diagnosis Date Noted  ? Labral tear of shoulder, degenerative, left   ? Plantar fasciitis 12/20/2018  ? Tightness of right gastrocnemius muscle 12/20/2018  ? Shoulder separation 12/20/2018  ? ? ?PCP: Deeann Saint, MD ? ?REFERRING PROVIDER: [name], MD ? ?REFERRING DIAG: s/p Labral repair ? ?THERAPY DIAG:  ?Acute pain of left shoulder ? ?Stiffness of left shoulder, not elsewhere classified ? ? ?ONSET DATE: 11/15/21-surgery ? ?SUBJECTIVE:                                                                                                                                                                                     ? ?SUBJECTIVE STATEMENT: ?Pt states the shoulder moves slightly better with holding on AROM exercise. Pt states he was not excessively sore after last session.  ? ?PERTINENT HISTORY: ?none ? ?PAIN:  ?Are you having pain? No  ?NPRS scale:0/10 ?Pain location: Lt shoulder ?PAIN TYPE: aching   ?Aggravating factors: being up and around ?Relieving factors: ice, meds ? ?PRECAUTIONS: Shoulder ? ?WEIGHT BEARING RESTRICTIONS Yes labral repair ? ? 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 ? ?PATIENT GOALS exercise, sports- basketball, running, baseball ? ?OBJECTIVE:  ?PATIENT SURVEYS:  ?FOTO 30 ?UPPER EXTREMITY AROM/PROM: ?  ?A/PROM Right ?12/16/21  ?Shoulder flexion 95 PROM /110 AAROM/ 55 AROM  ?Shoulder extension    ?Shoulder abduction 45 AROM   ?Shoulder adduction    ?Shoulder internal rotation  38  ?  Shoulder external rotation 12   ?Elbow flexion    ?Elbow extension    ?Wrist flexion    ?Wrist extension    ?Wrist ulnar deviation    ?Wrist radial deviation    ?Wrist pronation    ?Wrist supination    ?(Blank rows = not tested) ? ?  ?TODAY'S TREATMENT:  ? ?3/6:  ?(PROM protocol Limits: FF&ABD 160, ER 45, IR behind back to waist) ? ?PROM up to 140 flexion, ABD 110, ER to 40,  ?L GH[employer] Post & Inf mobs grade II-III ?STM L deltoid ? ?Pulley's 3 min ABD and flexion  ? ?Seated dowel ER 3s 10x ?AAROM dowel flexion and ABD 3s 10x ?Standing AROM- flexion, abduction 15x each ?IR stretch to waist line 3s 10x ? ?3/3:  ?(PROM protocol Limits: FF&ABD 160, ER 45, IR behind back to waist) ? ?L GHJ Post & Inf mobs grade II-III ?AAROM supine flexion 2x10 ?Seated dowel ER 3s 10x ?AAROM dowel flexion and ABD 3s 10x ?Standing AROM- flexion, abduction 15x each ? ?2/27:  ?(PROM protocol Limits: FF&ABD 160, ER 45, IR behind back to waist) ? ?L GH[employer] Post & Inf mobs grade II-III ?Flexion to 130, ABD to 110, IR to belly, ER 30 deg- spasm/guarding at end range ? ? ?AAROM flexion with dowel 3s 10x ?IR behind back with strap 2s 15x ?Standing AROM- flexion, abduction 15x each ?Pulley's scaption and ABD 3 min each ? ? ?2/24:  ?PROM all motions per protocol Limits: FF&ABD 160, ER 45, IR behind back to waist; Post & Inf mobs grade 2 ?AAROM flexion with dowel- chest press & 90-end range ?IR behind back with towel ?Standing AROM- flexion,  abduction ?Upper trap & levator stretch ? ?PATIENT EDUCATION: ?Education details: protocol progression,  anatomy, exercise progression, DOMS expectations, muscle firing,  HEP ?Person educated: Patient ?Education method: Explanation, Demonstration, Tactile cues, Verbal cues, and Handouts ?Education comprehension: verbalized understanding, returned demonstration, verbal cues required, tactile cues required, and needs further education ? ? ?HOME EXERCISE PROGRAM: ?Access Code: [email or web address] ?URL: [email or web address] ?Date: 12/23/2021 ?Prepared by: Zebedee Iba ? ?Exercises ?Standing Shoulder Flexion to 90 Degrees - 2 x daily - 7 x weekly - 1 sets - 10 reps ?Shoulder Abduction - Thumbs Up - 2 x daily - 7 x weekly - 1 sets - 10 reps ?Supine Shoulder External Rotation with Dowel - 2 x daily - 7 x weekly - 1 sets - 10 reps - 3-5 hold ?Shoulder Flexion Overhead with Dowel - 2 x daily - 7 x weekly - 2 sets - 10 reps - 5 hold ?Standing Shoulder Abduction AAROM with Dowel - 2 x daily - 7 x weekly - 3 sets - 10 reps - 5 hold ? ? ?ASSESSMENT: ? ?CLINICAL IMPRESSION: ?Pt able to progress ROM at today's session with less pinching pain at end range in flexion and ABD. Pt with signficant soft tissue restriction into deltoid that was relieved with ischemic pressure. Pt able to improve A/PROM by end of session. Pt advised to continue with AROM at home but at a reduce d frequency. Pt will be 6 weeks at next session. Trial light bands at next session for IR/ER.  ? ? ?Objective impairments include decreased ROM, decreased strength, increased muscle spasms, impaired flexibility, impaired UE functional use, improper body mechanics, postural dysfunction, and pain. These impairments are limiting patient from cleaning, meal prep, occupation, yard work, and physical fitness . Personal factors including 1 comorbidity: h/o shoulder separation  are also affecting patient's functional outcome.  Patient will benefit from skilled PT to  address above impairments and improve overall function. ? ?REHAB POTENTIAL: Good ? ?CLINICAL DECISION MAKING: Stable/uncomplicated ? ?EVALUATION COMPLEXITY: Low ? ? ?GOALS: ?Goals reviewed with patient? Yes ? ?SHORT TERM GOALS: ? ?STG Name Target Date Goal status  ?1 Passive & AA flexion to 90 without increased pain ?Baseline: unable at eval 01/23/2022 INITIAL  ?2 Demo proper activation of periscapular musculature while performing isometrics ?Baseline: will progress into these exercises as appropriate 01/23/2022 INITIAL  ?3 Pt will verbalize compliance with & understanding of restrictions while performing ADLs ?Baseline:will educate as appropriate 01/23/2022 INITIAL  ?     ?     ?     ?     ? ?LONG TERM GOALS:  ? ?LTG Name Target Date Goal status  ?1 Flexion to 160 with minimal to no discomfort ?Baseline: no motion at eval 02/20/2022 INITIAL  ?2 Able to reach center belt loop behind back without compensation ?Baseline:unable at eval 02/20/2022 INITIAL  ?3 Able to demo scapular control in lifting and maneuvering 5lb weight at and above shoulder height ?Baseline:unable at eval 03/20/2022 INITIAL  ?4 Will begin plyometric and CKC exercises  ?Baseline: will progress as appropriate 03/20/2022 INITIAL  ?5 PT and pt will collaborate to create further objective goals  ?Baseline: to be set at later date 03/20/2022 INITIAL  ?     ?     ? ?PLAN: ?PT FREQUENCY: 1-2x/week ? ?PT DURATION: 12 weeks ? ?PLANNED INTERVENTIONS: Therapeutic exercises, Therapeutic activity, Neuro Muscular re-education, Patient/Family education, Joint mobilization, Aquatic Therapy, Dry Needling, Electrical stimulation, Cryotherapy, Moist heat, scar mobilization, Taping, Vasopneumatic device, and Manual therapy ? ?PLAN FOR NEXT SESSION(s): revisit pulley (may have bought for home), work on ABD and flexion if still stiff ? ?Zebedee Iba PT, DPT ?12/26/21 9:19 AM ? ? ? ? ?

## 2021-12-30 ENCOUNTER — Encounter (HOSPITAL_BASED_OUTPATIENT_CLINIC_OR_DEPARTMENT_OTHER): Payer: Self-pay | Admitting: Physical Therapy

## 2021-12-30 ENCOUNTER — Other Ambulatory Visit: Payer: Self-pay

## 2021-12-30 ENCOUNTER — Ambulatory Visit (HOSPITAL_BASED_OUTPATIENT_CLINIC_OR_DEPARTMENT_OTHER): Payer: 59 | Admitting: Physical Therapy

## 2021-12-30 ENCOUNTER — Ambulatory Visit (INDEPENDENT_AMBULATORY_CARE_PROVIDER_SITE_OTHER): Payer: 59 | Admitting: Orthopaedic Surgery

## 2021-12-30 DIAGNOSIS — M25512 Pain in left shoulder: Secondary | ICD-10-CM | POA: Diagnosis not present

## 2021-12-30 DIAGNOSIS — M24112 Other articular cartilage disorders, left shoulder: Secondary | ICD-10-CM

## 2021-12-30 DIAGNOSIS — M25612 Stiffness of left shoulder, not elsewhere classified: Secondary | ICD-10-CM

## 2021-12-30 NOTE — Progress Notes (Signed)
? ?                            ? ? ?Post Operative Evaluation ?  ? ?Procedure/Date of Surgery: Left shoulder labral repair 11/15/21 ? ?Interval History:  ? ?Patient presents 6 weeks status post left shoulder labral repair.  Overall he is doing extremely well.  He is transition to active range of motion at this time.  He is still utilizing his sling.  He has essentially no pain at rest.  He does have some knots particular in the deltoid that he has been working on with physical therapy.  He is working with Hessie Diener. ? ? ?PMH/PSH/Family History/Social History/Meds/Allergies:   ? ?Past Medical History:  ?Diagnosis Date  ? Arthritis   ? bilateral shoulders  ? Chicken pox   ? Frequent headaches   ? GERD (gastroesophageal reflux disease)   ? Hay fever   ? ?Past Surgical History:  ?Procedure Laterality Date  ? SHOULDER ARTHROSCOPY WITH LABRAL REPAIR Left 11/15/2021  ? Procedure: left SHOULDER ARTHROSCOPY WITH LABRAL REPAIR;  Surgeon: [name], MD;  Location: [facility] OR;  Service: Orthopedics;  Laterality: Left;  ? WISDOM TOOTH EXTRACTION    ? ?Social History  ? ?Socioeconomic History  ? Marital status: Married  ?  Spouse name: Not on file  ? Number of children: Not on file  ? Years of education: Not on file  ? Highest education level: Bachelor's degree (e.g., BA, AB, BS)  ?Occupational History  ? Not on file  ?Tobacco Use  ? Smoking status: Never  ? Smokeless tobacco: Never  ?Vaping Use  ? Vaping Use: Never used  ?Substance and Sexual Activity  ? Alcohol use: Never  ?  Alcohol/week: 0.0 standard drinks  ? Drug use: Never  ? Sexual activity: Not on file  ?Other Topics Concern  ? Not on file  ?Social History Narrative  ? Not on file  ? ?Social Determinants of Health  ? ?Financial Resource Strain: Low Risk   ? Difficulty of Paying Living Expenses: Not hard at all  ?Food Insecurity: No Food Insecurity  ? Worried About Programme researcher, broadcasting/film/video in the Last Year: Never true  ? Ran Out of Food in the Last Year: Never true  ?Transportation  Needs: No Transportation Needs  ? Lack of Transportation (Medical): No  ? Lack of Transportation (Non-Medical): No  ?Physical Activity: Insufficiently Active  ? Days of Exercise per Week: 2 days  ? Minutes of Exercise per Session: 30 min  ?Stress: No Stress Concern Present  ? Feeling of Stress : Only a little  ?Social Connections: Unknown  ? Frequency of Communication with Friends and Family: Patient refused  ? Frequency of Social Gatherings with Friends and Family: Patient refused  ? Attends Religious Services: Patient refused  ? Active Member of Clubs or Organizations: No  ? Attends Banker Meetings: Not on file  ? Marital Status: Married  ? ?Family History  ?Problem Relation Age of Onset  ? Diabetes Mother   ? Cancer Father   ? Heart disease Paternal Grandmother   ? ?No Known Allergies ?Current Outpatient Medications  ?Medication Sig Dispense Refill  ? aspirin EC 325 MG tablet Take 1 tablet (325 mg total) by mouth daily. 30 tablet 0  ? cholecalciferol (VITAMIN D3) 25 MCG (1000 UNIT) tablet Take 2,000 Units by mouth daily.    ? loratadine (CLARITIN) 10 MG tablet Take 10 mg by mouth  daily.    ? oxycodone (OXY-IR) 5 MG capsule Take 1 capsule (5 mg total) by mouth every 4 (four) hours as needed (severe pain). 20 capsule 0  ? Pseudoephedrine HCl (SUDAFED SINUS CONGESTION 24HR) 240 MG TB24 Take 240 mg by mouth daily as needed (congestion).    ? ?No current facility-administered medications for this visit.  ? ?No results found. ? ?Review of Systems:   ?A ROS was performed including pertinent positives and negatives as documented in the HPI. ? ? ?Musculoskeletal Exam:   ? ?There were no vitals taken for this visit. ? ?Left shoulder arthroscopic incisions are well-healed.  Active forward elevation of the left arm is to 90 degrees.  Active external rotation at the side is to 30 compared to 60 on the contralateral side.  Is able to flex and extend at the left wrist and elbow.  2+ radial pulse ? ?Imaging:    ? ?None ? ?I personally reviewed and interpreted the radiographs. ? ? ?Assessment:   ?46 year old male 6 weeks status post left shoulder labral repair overall he is doing extremely well.  He will continue advance per protocol.  He may discontinue his sling at this time.  He will continue with an active and active assisted program ? ?Plan :   ? ?-Return to clinic 6 weeks ? ? ? ? ? ?I personally saw and evaluated the patient, and participated in the management and treatment plan. ? ?[name], MD ?Attending Physician, Orthopedic Surgery ? ?This document was dictated using Conservation officer, historic buildings. A reasonable attempt at proof reading has been made to minimize errors. ?

## 2021-12-30 NOTE — Therapy (Signed)
?OUTPATIENT PHYSICAL THERAPY TREATMENT ? ? ?Patient Name: Gary NeedyDavid A Hayes ?MRN: [id number] ?DOB:[birth year], 46 y.o., male ?Today's Date: 12/30/2021 ? ? PT End of Session - 12/30/21 0929   ? ? Visit Number 9   ? Number of Visits 33   ? Date for PT Re-Evaluation 03/10/22   ? Authorization Type UHC   ? PT Start Time 0845   ? PT Stop Time 0927   ? PT Time Calculation (min) 42 min   ? Activity Tolerance Patient tolerated treatment well   ? [facility] tasks assessed/performed   ? ?  ?  ? ?  ? ? ? ? ? ? ? ? ? ? ?Past Medical History:  ?Diagnosis Date  ? Arthritis   ? bilateral shoulders  ? Chicken pox   ? Frequent headaches   ? GERD (gastroesophageal reflux disease)   ? Hay fever   ? ?Past Surgical History:  ?Procedure Laterality Date  ? SHOULDER ARTHROSCOPY WITH LABRAL REPAIR Left 11/15/2021  ? Procedure: left SHOULDER ARTHROSCOPY WITH LABRAL REPAIR;  Surgeon: Huel CoteBokshan, Steven, MD;  Location: [facility] OR;  Service: Orthopedics;  Laterality: Left;  ? WISDOM TOOTH EXTRACTION    ? ?Patient Active Problem List  ? Diagnosis Date Noted  ? Labral tear of shoulder, degenerative, left   ? Plantar fasciitis 12/20/2018  ? Tightness of right gastrocnemius muscle 12/20/2018  ? Shoulder separation 12/20/2018  ? ? ?PCP: Deeann SaintBanks, Shannon R, MD ? ?REFERRING PROVIDER: Huel CoteSteven Bokshan, MD ? ?REFERRING DIAG: s/p Labral repair ? ?THERAPY DIAG:  ?Acute pain of left shoulder ? ?Stiffness of left shoulder, not elsewhere classified ? ? ?ONSET DATE: 11/15/21-surgery ? ?SUBJECTIVE:                                                                                                                                                                                     ? ?SUBJECTIVE STATEMENT: ?Pt states that there is a pinch in the shoulder with lifting that is still related to knot in his deltoid. He also found a muscle knot in the R pec. Pt states no longer needs sling per MD.  ? ?PERTINENT HISTORY: ?none ? ?PAIN:  ?Are you having pain? No  ?NPRS  scale:0/10 ?Pain location: Lt shoulder ?PAIN TYPE: aching  ?Aggravating factors: being up and around ?Relieving factors: ice, meds ? ?PRECAUTIONS: Shoulder ? ?WEIGHT BEARING RESTRICTIONS Yes labral repair ? ? 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 ? ?PATIENT GOALS exercise, sports- basketball, running, baseball ? ?OBJECTIVE:  ?PATIENT SURVEYS:  ?FOTO 30 ?UPPER EXTREMITY AROM/PROM: ?  ?A/PROM Right ?12/16/21  ?Shoulder flexion 95 PROM /110 AAROM/ 55 AROM  ?Shoulder extension    ?  Shoulder abduction 45 AROM   ?Shoulder adduction    ?Shoulder internal rotation  38  ?Shoulder external rotation 12   ?Elbow flexion    ?Elbow extension    ?Wrist flexion    ?Wrist extension    ?Wrist ulnar deviation    ?Wrist radial deviation    ?Wrist pronation    ?Wrist supination    ?(Blank rows = not tested) ? ?  ?TODAY'S TREATMENT:  ? ?3/10:  ?(PROM protocol Limits: FF&ABD 160, ER 45, IR behind back to waist) ? ?PROM up to 140 flexion, ABD 110, ER to 40,  ?L GHJ Post & Inf mobs grade IV ?STM L deltoid and pec ? ?Pulley's 3 min ABD and flexion  ? ?Seated dowel ER 3s 10x ?Rowing YTB 3x10 ?IR/ER YTB 2x10 ?Standing AROM- flexion, abduction 15x each ?IR stretch to waist line 3s 10x ?Crossbody stretch 30s 2x ? ? ? ?PATIENT EDUCATION: ?Education details: protocol progression,  anatomy, exercise progression, DOMS expectations, muscle firing,  HEP ?Person educated: Patient ?Education method: Explanation, Demonstration, Tactile cues, Verbal cues, and Handouts ?Education comprehension: verbalized understanding, returned demonstration, verbal cues required, tactile cues required, and needs further education ? ? ?HOME EXERCISE PROGRAM: ?Access Code: [email or web address] ?URL: [email or web address] ?Date: 12/30/2021 ?Prepared by: Zebedee Iba ? ?Exercises ?Standing Shoulder Flexion to 90 Degrees - 2 x daily - 7 x weekly - 1 sets - 10 reps ?Shoulder Abduction - Thumbs Up - 2 x daily - 7 x weekly - 1 sets - 10 reps ?Supine Shoulder External  Rotation with Dowel - 2 x daily - 7 x weekly - 1 sets - 10 reps - 3-5 hold ?Standing Shoulder Row with Anchored Resistance - 1 x daily - 7 x weekly - 3 sets - 10 reps ?Seated Shoulder Abduction Towel Slide at Table Top - 1 x daily - 7 x weekly - 2 sets - 10 reps - 5 hold ? ? ? ?ASSESSMENT: ? ?CLINICAL IMPRESSION: ?Pt now at 6 wks and able to stop wearing sling per MD. Pt able to progress ROM at today's session following stronger grade joint mobilization inf. Pt able to reach 150 flexion, 130 ABD. Pt able to tolerate progression to light restiance with YTB. HEP updated, may add IR/ER at next. Plan to revisit at next session, continue to progress towards ROM limits, and revisit joint mobilizations again. No pinching noted in shoulder following mob today. Pt pain is well controlled.  ? ? ?Objective impairments include decreased ROM, decreased strength, increased muscle spasms, impaired flexibility, impaired UE functional use, improper body mechanics, postural dysfunction, and pain. These impairments are limiting patient from cleaning, meal prep, occupation, yard work, and physical fitness . Personal factors including 1 comorbidity: h/o shoulder separation  are also affecting patient's functional outcome. Patient will benefit from skilled PT to address above impairments and improve overall function. ? ?REHAB POTENTIAL: Good ? ?CLINICAL DECISION MAKING: Stable/uncomplicated ? ?EVALUATION COMPLEXITY: Low ? ? ?GOALS: ?Goals reviewed with patient? Yes ? ?SHORT TERM GOALS: ? ?STG Name Target Date Goal status  ?1 Passive & AA flexion to 90 without increased pain ?Baseline: unable at eval 01/27/2022 INITIAL  ?2 Demo proper activation of periscapular musculature while performing isometrics ?Baseline: will progress into these exercises as appropriate 01/27/2022 INITIAL  ?3 Pt will verbalize compliance with & understanding of restrictions while performing ADLs ?Baseline:will educate as appropriate 01/27/2022 INITIAL  ?     ?     ?      ?     ? ?  LONG TERM GOALS:  ? ?LTG Name Target Date Goal status  ?1 Flexion to 160 with minimal to no discomfort ?Baseline: no motion at eval 02/24/2022 INITIAL  ?2 Able to reach center belt loop behind back without compensation ?Baseline:unable at eval 02/24/2022 INITIAL  ?3 Able to demo scapular control in lifting and maneuvering 5lb weight at and above shoulder height ?Baseline:unable at eval 03/24/2022 INITIAL  ?4 Will begin plyometric and CKC exercises  ?Baseline: will progress as appropriate 03/24/2022 INITIAL  ?5 PT and pt will collaborate to create further objective goals  ?Baseline: to be set at later date 03/24/2022 INITIAL  ?     ?     ? ?PLAN: ?PT FREQUENCY: 1-2x/week ? ?PT DURATION: 12 weeks ? ?PLANNED INTERVENTIONS: Therapeutic exercises, Therapeutic activity, Neuro Muscular re-education, Patient/Family education, Joint mobilization, Aquatic Therapy, Dry Needling, Electrical stimulation, Cryotherapy, Moist heat, scar mobilization, Taping, Vasopneumatic device, and Manual therapy ? ?PLAN FOR NEXT SESSION(s): revisit manual, continue with ROM to limits, light resistance review ? ?Zebedee Iba PT, DPT ?12/30/21 9:30 AM ? ? ? ? ?

## 2022-01-02 ENCOUNTER — Encounter (HOSPITAL_BASED_OUTPATIENT_CLINIC_OR_DEPARTMENT_OTHER): Payer: Self-pay | Admitting: Physical Therapy

## 2022-01-02 ENCOUNTER — Ambulatory Visit (HOSPITAL_BASED_OUTPATIENT_CLINIC_OR_DEPARTMENT_OTHER): Payer: 59 | Admitting: Physical Therapy

## 2022-01-02 ENCOUNTER — Other Ambulatory Visit: Payer: Self-pay

## 2022-01-02 DIAGNOSIS — M25512 Pain in left shoulder: Secondary | ICD-10-CM

## 2022-01-02 DIAGNOSIS — M25612 Stiffness of left shoulder, not elsewhere classified: Secondary | ICD-10-CM

## 2022-01-02 NOTE — Therapy (Signed)
?OUTPATIENT PHYSICAL PROGRESS NOTE ? ? ?Patient Name: Gary Hayes ?MRN: [id number] ?DOB:[birth year], 46 y.o., male ?Today's Date: 01/02/2022 ? ? PT End of Session - 01/02/22 0914   ? ? Visit Number 10   ? Number of Visits 33   ? Date for PT Re-Evaluation 03/10/22   ? Authorization Type UHC   ? PT Start Time 0840   ? PT Stop Time 0092   ? PT Time Calculation (min) 40 min   ? Activity Tolerance Patient tolerated treatment well   ? Behavior During Therapy [facility] for tasks assessed/performed   ? ?  ?  ? ?  ? ? ? ? ? ? ? ? ? ? ? ?Past Medical History:  ?Diagnosis Date  ? Arthritis   ? bilateral shoulders  ? Chicken pox   ? Frequent headaches   ? GERD (gastroesophageal reflux disease)   ? Hay fever   ? ?Past Surgical History:  ?Procedure Laterality Date  ? SHOULDER ARTHROSCOPY WITH LABRAL REPAIR Left 11/15/2021  ? Procedure: left SHOULDER ARTHROSCOPY WITH LABRAL REPAIR;  Surgeon: Vanetta Mulders, MD;  Location: [facility];  Service: Orthopedics;  Laterality: Left;  ? WISDOM TOOTH EXTRACTION    ? ?Patient Active Problem List  ? Diagnosis Date Noted  ? Labral tear of shoulder, degenerative, left   ? Plantar fasciitis 12/20/2018  ? Tightness of right gastrocnemius muscle 12/20/2018  ? Shoulder separation 12/20/2018  ? ? ?PCP: Billie Ruddy, MD ? ?REFERRING PROVIDER: Vanetta Mulders, MD ? ?REFERRING DIAG: s/p Labral repair ? ?THERAPY DIAG:  ?Acute pain of left shoulder ? ?Stiffness of left shoulder, not elsewhere classified ? ? ?ONSET DATE: 11/15/21-surgery ? ?SUBJECTIVE:                                                                                                                                                                                     ? ?SUBJECTIVE STATEMENT: ?Pt states that the shoulder was a little sore after last session. He states he feels something rolling over the lateral aaspect of the shoulder as he went to exercise.  ? ?PERTINENT HISTORY: ?none ? ?PAIN:  ?Are you having pain? No  ?NPRS scale:0/10 ?Pain  location: Lt shoulder ?PAIN TYPE: aching  ?Aggravating factors: being up and around ?Relieving factors: ice, meds ? ?PRECAUTIONS: Shoulder ? ?WEIGHT BEARING RESTRICTIONS Yes labral repair ? ? 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 ? ?PATIENT GOALS exercise, sports- basketball, running, baseball ? ?OBJECTIVE:  ?PATIENT SURVEYS:  ?FOTO 30 ?UPPER EXTREMITY AROM/PROM: ?  ?A/PROM Right ?12/16/21  ?Shoulder flexion 140  ?Shoulder extension    ?Shoulder abduction 125   ?Shoulder adduction    ?Shoulder internal  rotation 40  ?Shoulder external rotation 35  ?(Blank rows = not tested) ? ?  ?TODAY'S TREATMENT:  ? ?3/13  ?(PROM protocol Limits: FF&ABD 160, ER 45, IR behind back to waist) ? ?PROM up to 145 flexion, ABD 120, ?L GHJ Post & Inf mobs grade IV ? ? ?Pulley's 3 min ABD and flexion  ?Wall ladder scaption and ABD 5s 5x ? ?Rowing RTB 3x10 ?IR/ER YTB 2x10 ?RTB tricep extension 2x10 ?2lb biceps curl 2x10 ? ?Crossbody stretch 30s 2x (not performed) ? ? ? ?PATIENT EDUCATION: ?Education details: anatomy, exercise progression, DOMS expectations, muscle firing,  HEP ?Person educated: Patient ?Education method: Explanation, Demonstration, Tactile cues, Verbal cues, and Handouts ?Education comprehension: verbalized understanding, returned demonstration, verbal cues required, tactile cues required, and needs further education ? ? ?HOME EXERCISE PROGRAM: ?Access Code: [email or web address] ?URL: [email or web address] ?Date: 12/30/2021 ?Prepared by: Daleen Bo ? ?Exercises ?Standing Shoulder Flexion to 90 Degrees - 2 x daily - 7 x weekly - 1 sets - 10 reps ?Shoulder Abduction - Thumbs Up - 2 x daily - 7 x weekly - 1 sets - 10 reps ?Supine Shoulder External Rotation with Dowel - 2 x daily - 7 x weekly - 1 sets - 10 reps - 3-5 hold ?Standing Shoulder Row with Anchored Resistance - 1 x daily - 7 x weekly - 3 sets - 10 reps ?Seated Shoulder Abduction Towel Slide at Table Top - 1 x daily - 7 x weekly - 2 sets - 10 reps - 5  hold ? ? ? ?ASSESSMENT: ? ?CLINICAL IMPRESSION: ?Pt at 6 wks and has made signficant improvement in objective ROM measures. MMT inappropriate at this time. Pt with improved ABD and flexion ROM as compared to previous session. Pt with very good response to GHJ mobs and ability to reach further into OH positions. Light resistance was increased and pt able to incorporate biceps and triceps resistance without pain or discomfort.  No pinching noted in shoulder following mob today. Pt pain continues to be well controlled. HEP not updated at this time given increase in soreness from last session. ? ? ?Objective impairments include decreased ROM, decreased strength, increased muscle spasms, impaired flexibility, impaired UE functional use, improper body mechanics, postural dysfunction, and pain. These impairments are limiting patient from cleaning, meal prep, occupation, yard work, and physical fitness . Personal factors including 1 comorbidity: h/o shoulder separation  are also affecting patient's functional outcome. Patient will benefit from skilled PT to address above impairments and improve overall function. ? ?REHAB POTENTIAL: Good ? ?CLINICAL DECISION MAKING: Stable/uncomplicated ? ?EVALUATION COMPLEXITY: Low ? ? ?GOALS: ?Goals reviewed with patient? Yes ? ?SHORT TERM GOALS: ? ?STG Name Target Date Goal status  ?1 Passive & AA flexion to 90 without increased pain ?Baseline: unable at eval 01/30/2022 MET  ?2 Demo proper activation of periscapular musculature while performing isometrics ?Baseline: will progress into these exercises as appropriate 01/30/2022 MET  ?3 Pt will verbalize compliance with & understanding of restrictions while performing ADLs ?Baseline:will educate as appropriate 01/30/2022 MET  ?     ?     ?     ?     ? ?LONG TERM GOALS:  ? ?LTG Name Target Date Goal status  ?1 Flexion to 160 with minimal to no discomfort ?Baseline: no motion at eval 02/27/2022 ongoing  ?2 Able to reach center belt loop behind back  without compensation ?Baseline:unable at eval 02/27/2022 ongoing  ?3 Able to demo scapular control in lifting and maneuvering 5lb weight at  and above shoulder height ?Baseline:unable at eval 03/27/2022 ongoing  ?4 Will begin plyometric and CKC exercises  ?Baseline: will progress as appropriate 03/27/2022 ongoing  ?5 PT and pt will collaborate to create further objective goals  ?Baseline: to be set at later date 03/27/2022 ongoing  ?     ?     ? ?PLAN: ?PT FREQUENCY: 1-2x/week ? ?PT DURATION: 12 weeks ? ?PLANNED INTERVENTIONS: Therapeutic exercises, Therapeutic activity, Neuro Muscular re-education, Patient/Family education, Joint mobilization, Aquatic Therapy, Dry Needling, Electrical stimulation, Cryotherapy, Moist heat, scar mobilization, Taping, Vasopneumatic device, and Manual therapy ? ?PLAN FOR NEXT SESSION(s): revisit manual, continue with ROM to limits, light resistance review ? ?Daleen Bo PT, DPT ?01/02/22 9:22 AM ? ? ? ? ?

## 2022-01-06 ENCOUNTER — Other Ambulatory Visit: Payer: Self-pay

## 2022-01-06 ENCOUNTER — Encounter (HOSPITAL_BASED_OUTPATIENT_CLINIC_OR_DEPARTMENT_OTHER): Payer: Self-pay | Admitting: Physical Therapy

## 2022-01-06 ENCOUNTER — Ambulatory Visit (HOSPITAL_BASED_OUTPATIENT_CLINIC_OR_DEPARTMENT_OTHER): Payer: 59 | Admitting: Physical Therapy

## 2022-01-06 DIAGNOSIS — M25612 Stiffness of left shoulder, not elsewhere classified: Secondary | ICD-10-CM

## 2022-01-06 DIAGNOSIS — M25512 Pain in left shoulder: Secondary | ICD-10-CM

## 2022-01-06 NOTE — Therapy (Signed)
?OUTPATIENT PHYSICAL PROGRESS NOTE ? ? ?Patient Name: Gary Hayes ?MRN: [id number] ?DOB:May 05, 1976, 46 y.o., male ?Today's Date: 01/06/2022 ? ? PT End of Session - 01/06/22 0841   ? ? Visit Number 11   ? Number of Visits 33   ? Date for PT Re-Evaluation 03/10/22   ? Authorization Type UHC   ? PT Start Time [phone]   ? PT Stop Time [phone]   ? PT Time Calculation (min) 40 min   ? Activity Tolerance Patient tolerated treatment well   ? Behavior During Therapy [facility] for tasks assessed/performed   ? ?  ?  ? ?  ? ? ? ? ? ? ? ? ? ? ? ?Past Medical History:  ?Diagnosis Date  ? Arthritis   ? bilateral shoulders  ? Chicken pox   ? Frequent headaches   ? GERD (gastroesophageal reflux disease)   ? Hay fever   ? ?Past Surgical History:  ?Procedure Laterality Date  ? SHOULDER ARTHROSCOPY WITH LABRAL REPAIR Left 11/15/2021  ? Procedure: left SHOULDER ARTHROSCOPY WITH LABRAL REPAIR;  Surgeon: Vanetta Mulders, MD;  Location: [facility];  Service: Orthopedics;  Laterality: Left;  ? WISDOM TOOTH EXTRACTION    ? ?Patient Active Problem List  ? Diagnosis Date Noted  ? Labral tear of shoulder, degenerative, left   ? Plantar fasciitis 12/20/2018  ? Tightness of right gastrocnemius muscle 12/20/2018  ? Shoulder separation 12/20/2018  ? ? ?PCP: Billie Ruddy, MD ? ?REFERRING PROVIDER: Vanetta Mulders, MD ? ?REFERRING DIAG: s/p Labral repair ? ?THERAPY DIAG:  ?Acute pain of left shoulder ? ?Stiffness of left shoulder, not elsewhere classified ? ? ?ONSET DATE: 11/15/21-surgery ? ?SUBJECTIVE:                                                                                                                                                                                     ? ?SUBJECTIVE STATEMENT: ?Pt states that the shoulder was sore after last session but no other adverse response. No pain with HEP.  ? ? ?PERTINENT HISTORY: ?none ? ?PAIN:  ?Are you having pain? No  ?NPRS scale:0/10 ?Pain location: Lt shoulder ?PAIN TYPE: aching  ?Aggravating factors: being  up and around ?Relieving factors: ice, meds ? ?PRECAUTIONS: Shoulder ? ?WEIGHT BEARING RESTRICTIONS Yes labral repair ? ? 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 ? ?PATIENT GOALS exercise, sports- basketball, running, baseball ? ?OBJECTIVE:  ?PATIENT SURVEYS:  ?FOTO 30 ?UPPER EXTREMITY AROM/PROM: ?  ?A/PROM Right ?12/16/21  ?Shoulder flexion 140  ?Shoulder extension    ?Shoulder abduction 125   ?Shoulder adduction    ?Shoulder internal rotation 40  ?Shoulder external rotation 35  ?(Blank rows =  not tested) ? ?  ?TODAY'S TREATMENT:  ? ?3/13  ?(PROM protocol Limits: FF&ABD 160, ER 45, IR behind back to waist) ? ?PROM up to 145 flexion, ABD 120 ?L GHJ Post & Inf mobs grade IV ? ? ?Supine protraction 3lbs 2x10 ?Standing pec stretch low 30s 3x ?Wall ladder scaption and ABD 3s 10x ?Standing bicep table stretch 30s 3x ?IR/ER YTB 2x10 ?Wall ball circle 20s CW and CCW ? ? ? ?PATIENT EDUCATION: ?Education details: anatomy, exercise progression, DOMS expectations, muscle firing,  HEP ?Person educated: Patient ?Education method: Explanation, Demonstration, Tactile cues, Verbal cues, and Handouts ?Education comprehension: verbalized understanding, returned demonstration, verbal cues required, tactile cues required, and needs further education ? ? ?HOME EXERCISE PROGRAM: ?Access Code: [email or web address] ?URL: [email or web address] ?Date: 12/30/2021 ?Prepared by: Daleen Bo ? ?Exercises ?Standing Shoulder Flexion to 90 Degrees - 2 x daily - 7 x weekly - 1 sets - 10 reps ?Shoulder Abduction - Thumbs Up - 2 x daily - 7 x weekly - 1 sets - 10 reps ?Supine Shoulder External Rotation with Dowel - 2 x daily - 7 x weekly - 1 sets - 10 reps - 3-5 hold ?Standing Shoulder Row with Anchored Resistance - 1 x daily - 7 x weekly - 3 sets - 10 reps ?Seated Shoulder Abduction Towel Slide at Table Top - 1 x daily - 7 x weekly - 2 sets - 10 reps - 5 hold ? ? ? ?ASSESSMENT: ? ?CLINICAL IMPRESSION: ?Pt at 6 wks and continues to make  improvement in ROM measures. Pt able to reach to higher degrees with AAROM in flexion and ABD. Pt without pinching sensation at end range following grade IV mob but does have slight discomfort with eccentric lowering, as expected given timeline. Pt does have anterior shoulder tightness with soft tissue restriction largely in pec and biceps. Pt had improved ABD ROM following pec stretching. HEP updated at this time with new stretching and light resistance.  ? ? ?Objective impairments include decreased ROM, decreased strength, increased muscle spasms, impaired flexibility, impaired UE functional use, improper body mechanics, postural dysfunction, and pain. These impairments are limiting patient from cleaning, meal prep, occupation, yard work, and physical fitness . Personal factors including 1 comorbidity: h/o shoulder separation  are also affecting patient's functional outcome. Patient will benefit from skilled PT to address above impairments and improve overall function. ? ?REHAB POTENTIAL: Good ? ?CLINICAL DECISION MAKING: Stable/uncomplicated ? ?EVALUATION COMPLEXITY: Low ? ? ?GOALS: ?Goals reviewed with patient? Yes ? ?SHORT TERM GOALS: ? ?STG Name Target Date Goal status  ?1 Passive & AA flexion to 90 without increased pain ?Baseline: unable at eval 02/03/2022 MET  ?2 Demo proper activation of periscapular musculature while performing isometrics ?Baseline: will progress into these exercises as appropriate 02/03/2022 MET  ?3 Pt will verbalize compliance with & understanding of restrictions while performing ADLs ?Baseline:will educate as appropriate 02/03/2022 MET  ?     ?     ?     ?     ? ?LONG TERM GOALS:  ? ?LTG Name Target Date Goal status  ?1 Flexion to 160 with minimal to no discomfort ?Baseline: no motion at eval 03/03/2022 ongoing  ?2 Able to reach center belt loop behind back without compensation ?Baseline:unable at eval 03/03/2022 ongoing  ?3 Able to demo scapular control in lifting and maneuvering 5lb  weight at and above shoulder height ?Baseline:unable at eval 03/31/2022 ongoing  ?4 Will begin plyometric and CKC exercises  ?Baseline: will progress as appropriate  03/31/2022 ongoing  ?5 PT and pt will collaborate to create further objective goals  ?Baseline: to be set at later date 03/31/2022 ongoing  ?     ?     ? ?PLAN: ?PT FREQUENCY: 1-2x/week ? ?PT DURATION: 12 weeks ? ?PLANNED INTERVENTIONS: Therapeutic exercises, Therapeutic activity, Neuro Muscular re-education, Patient/Family education, Joint mobilization, Aquatic Therapy, Dry Needling, Electrical stimulation, Cryotherapy, Moist heat, scar mobilization, Taping, Vasopneumatic device, and Manual therapy ? ?PLAN FOR NEXT SESSION(s): revisit manual, continue with ROM to limits, light resistance review ? ?Daleen Bo PT, DPT ?01/06/22 9:23 AM ? ? ? ? ?

## 2022-01-09 ENCOUNTER — Ambulatory Visit (HOSPITAL_BASED_OUTPATIENT_CLINIC_OR_DEPARTMENT_OTHER): Payer: 59 | Admitting: Physical Therapy

## 2022-01-09 ENCOUNTER — Other Ambulatory Visit: Payer: Self-pay

## 2022-01-09 ENCOUNTER — Encounter (HOSPITAL_BASED_OUTPATIENT_CLINIC_OR_DEPARTMENT_OTHER): Payer: Self-pay | Admitting: Physical Therapy

## 2022-01-09 DIAGNOSIS — M25512 Pain in left shoulder: Secondary | ICD-10-CM

## 2022-01-09 DIAGNOSIS — M25612 Stiffness of left shoulder, not elsewhere classified: Secondary | ICD-10-CM

## 2022-01-09 NOTE — Therapy (Signed)
?OUTPATIENT PHYSICAL PROGRESS NOTE ? ? ?Patient Name: Gary Hayes ?MRN: [id number] ?DOB:12-Aug-1976, 46 y.o., male ?Today's Date: 01/09/2022 ? ? PT End of Session - 01/09/22 0905   ? ? Visit Number 12   ? Number of Visits 33   ? Date for PT Re-Evaluation 03/10/22   ? Authorization Type UHC   ? PT Start Time [phone]   ? PT Stop Time 0925   ? PT Time Calculation (min) 38 min   ? Activity Tolerance Patient tolerated treatment well   ? Behavior During Therapy [facility] for tasks assessed/performed   ? ?  ?  ? ?  ? ? ? ? ? ? ? ? ? ? ? ? ?Past Medical History:  ?Diagnosis Date  ? Arthritis   ? bilateral shoulders  ? Chicken pox   ? Frequent headaches   ? GERD (gastroesophageal reflux disease)   ? Hay fever   ? ?Past Surgical History:  ?Procedure Laterality Date  ? SHOULDER ARTHROSCOPY WITH LABRAL REPAIR Left 11/15/2021  ? Procedure: left SHOULDER ARTHROSCOPY WITH LABRAL REPAIR;  Surgeon: Vanetta Mulders, MD;  Location: [place];  Service: Orthopedics;  Laterality: Left;  ? WISDOM TOOTH EXTRACTION    ? ?Patient Active Problem List  ? Diagnosis Date Noted  ? Labral tear of shoulder, degenerative, left   ? Plantar fasciitis 12/20/2018  ? Tightness of right gastrocnemius muscle 12/20/2018  ? Shoulder separation 12/20/2018  ? ? ?PCP: Billie Ruddy, MD ? ?REFERRING PROVIDER: Vanetta Mulders, MD ? ?REFERRING DIAG: s/p Labral repair ? ?THERAPY DIAG:  ?Acute pain of left shoulder ? ?Stiffness of left shoulder, not elsewhere classified ? ? ?ONSET DATE: 11/15/21-surgery ? ?SUBJECTIVE:                                                                                                                                                                                     ? ?SUBJECTIVE STATEMENT: ?Pt states that sitting at the bakestball games all day made the shoulder tight. He reported no pain or soreness after last session.  ? ? ?PERTINENT HISTORY: ?none ? ?PAIN:  ?Are you having pain? No  ?NPRS scale:0/10 ?Pain location: Lt shoulder ?PAIN TYPE: aching   ?Aggravating factors: being up and around ?Relieving factors: ice, meds ? ?PRECAUTIONS: Shoulder ? ?WEIGHT BEARING RESTRICTIONS Yes labral repair ? ? 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 ? ?PATIENT GOALS exercise, sports- basketball, running, baseball ? ?OBJECTIVE:  ?PATIENT SURVEYS:  ?FOTO 30 ?UPPER EXTREMITY AROM/PROM: ?  ?A/PROM Right ?12/16/21  ?Shoulder flexion 140  ?Shoulder extension    ?Shoulder abduction 125   ?Shoulder adduction    ?Shoulder internal rotation 40  ?Shoulder external  rotation 35  ?(Blank rows = not tested) ? ?  ?TODAY'S TREATMENT:  ? ?3/20  ?(PROM protocol Limits: FF&ABD 160, ER 45, IR behind back to waist) ? ?PROM up to 145 flexion, ABD 120 ?L GHJ Post & Inf mobs grade IV ? ? ?Supine protraction 3lbs 2x10 ?Supine ER dowel 5s 2x10 ?Pulleys ABD 3 min ?Standing pec stretch low 30s 3x ?Bicep table stretch 30s 2x ?Prone T 2x10 ?AROM ABD 2x10 to 90-95 deg  ?Wall ball circle 20s CW and CCW at shoulder level ? ?3/13  ?(PROM protocol Limits: FF&ABD 160, ER 45, IR behind back to waist) ? ?PROM up to 145 flexion, ABD 120 ?L GHJ Post & Inf mobs grade IV ? ? ?Supine protraction 3lbs 2x10 ?Standing pec stretch low 30s 3x ?Wall ladder scaption and ABD 3s 10x ?Standing bicep table stretch 30s 3x ?IR/ER YTB 2x10 ?Wall ball circle 20s CW and CCW ? ? ? ?PATIENT EDUCATION: ?Education details: anatomy, exercise progression, DOMS expectations, muscle firing,  HEP ?Person educated: Patient ?Education method: Explanation, Demonstration, Tactile cues, Verbal cues, and Handouts ?Education comprehension: verbalized understanding, returned demonstration, verbal cues required, tactile cues required, and needs further education ? ? ?HOME EXERCISE PROGRAM: ?Access Code: [email or web address] ?URL: [email or web address] ?Date: 12/30/2021 ?Prepared by: Daleen Bo ? ?Exercises ?Standing Shoulder Flexion to 90 Degrees - 2 x daily - 7 x weekly - 1 sets - 10 reps ?Shoulder Abduction - Thumbs Up - 2 x daily - 7 x  weekly - 1 sets - 10 reps ?Supine Shoulder External Rotation with Dowel - 2 x daily - 7 x weekly - 1 sets - 10 reps - 3-5 hold ?Standing Shoulder Row with Anchored Resistance - 1 x daily - 7 x weekly - 3 sets - 10 reps ?Seated Shoulder Abduction Towel Slide at Table Top - 1 x daily - 7 x weekly - 2 sets - 10 reps - 5 hold ? ? ? ?ASSESSMENT: ? ?CLINICAL IMPRESSION: ?Pt near 7 wks and continues to make improvement in ROM measures. Pt with largest improvement in AROM ABD. Pt able to introduce light prone exercise today without discomfort. Pt is still largely limited by shoulder soft tissue restriction but is improving with each session. HEP not changed at this time. Plan to continue with ROM focus and scapular activation at next session.  ? ? ?Objective impairments include decreased ROM, decreased strength, increased muscle spasms, impaired flexibility, impaired UE functional use, improper body mechanics, postural dysfunction, and pain. These impairments are limiting patient from cleaning, meal prep, occupation, yard work, and physical fitness . Personal factors including 1 comorbidity: h/o shoulder separation  are also affecting patient's functional outcome. Patient will benefit from skilled PT to address above impairments and improve overall function. ? ?REHAB POTENTIAL: Good ? ?CLINICAL DECISION MAKING: Stable/uncomplicated ? ?EVALUATION COMPLEXITY: Low ? ? ?GOALS: ?Goals reviewed with patient? Yes ? ?SHORT TERM GOALS: ? ?STG Name Target Date Goal status  ?1 Passive & AA flexion to 90 without increased pain ?Baseline: unable at eval 02/06/2022 MET  ?2 Demo proper activation of periscapular musculature while performing isometrics ?Baseline: will progress into these exercises as appropriate 02/06/2022 MET  ?3 Pt will verbalize compliance with & understanding of restrictions while performing ADLs ?Baseline:will educate as appropriate 02/06/2022 MET  ?     ?     ?     ?     ? ?LONG TERM GOALS:  ? ?LTG Name Target Date Goal  status  ?1 Flexion to 160 with minimal  to no discomfort ?Baseline: no motion at eval 03/06/2022 ongoing  ?2 Able to reach center belt loop behind back without compensation ?Baseline:unable at eval 03/06/2022 ongoing  ?3 Able to demo scapular control in lifting and maneuvering 5lb weight at and above shoulder height ?Baseline:unable at eval 04/03/2022 ongoing  ?4 Will begin plyometric and CKC exercises  ?Baseline: will progress as appropriate 04/03/2022 ongoing  ?5 PT and pt will collaborate to create further objective goals  ?Baseline: to be set at later date 04/03/2022 ongoing  ?     ?     ? ?PLAN: ?PT FREQUENCY: 1-2x/week ? ?PT DURATION: 12 weeks ? ?PLANNED INTERVENTIONS: Therapeutic exercises, Therapeutic activity, Neuro Muscular re-education, Patient/Family education, Joint mobilization, Aquatic Therapy, Dry Needling, Electrical stimulation, Cryotherapy, Moist heat, scar mobilization, Taping, Vasopneumatic device, and Manual therapy ? ?PLAN FOR NEXT SESSION(s): revisit manual, continue with ROM to limits, light resistance review ? ?Daleen Bo PT, DPT ?01/09/22 9:25 AM ? ? ? ? ?

## 2022-01-13 ENCOUNTER — Other Ambulatory Visit: Payer: Self-pay

## 2022-01-13 ENCOUNTER — Ambulatory Visit (HOSPITAL_BASED_OUTPATIENT_CLINIC_OR_DEPARTMENT_OTHER): Payer: 59 | Admitting: Physical Therapy

## 2022-01-13 ENCOUNTER — Encounter (HOSPITAL_BASED_OUTPATIENT_CLINIC_OR_DEPARTMENT_OTHER): Payer: Self-pay | Admitting: Physical Therapy

## 2022-01-13 DIAGNOSIS — M25612 Stiffness of left shoulder, not elsewhere classified: Secondary | ICD-10-CM

## 2022-01-13 DIAGNOSIS — M25512 Pain in left shoulder: Secondary | ICD-10-CM | POA: Diagnosis not present

## 2022-01-13 NOTE — Therapy (Signed)
?OUTPATIENT PHYSICAL PROGRESS NOTE ? ? ?Patient Name: Gary Hayes ?MRN: [id number] ?DOB:[birth year], 46 y.o., male ?Today's Date: 01/13/2022 ? ? PT End of Session - 01/13/22 0931   ? ? Visit Number 13   ? Number of Visits 33   ? Date for PT Re-Evaluation 03/10/22   ? Authorization Type UHC   ? PT Start Time 0845   ? PT Stop Time [phone]   ? PT Time Calculation (min) 38 min   ? Activity Tolerance Patient tolerated treatment well   ? Behavior During Therapy [facility] for tasks assessed/performed   ? ?  ?  ? ?  ? ? ? ? ? ? ? ? ? ? ? ? ? ?Past Medical History:  ?Diagnosis Date  ? Arthritis   ? bilateral shoulders  ? Chicken pox   ? Frequent headaches   ? GERD (gastroesophageal reflux disease)   ? Hay fever   ? ?Past Surgical History:  ?Procedure Laterality Date  ? SHOULDER ARTHROSCOPY WITH LABRAL REPAIR Left 11/15/2021  ? Procedure: left SHOULDER ARTHROSCOPY WITH LABRAL REPAIR;  Surgeon: Vanetta Mulders, MD;  Location: [place];  Service: Orthopedics;  Laterality: Left;  ? WISDOM TOOTH EXTRACTION    ? ?Patient Active Problem List  ? Diagnosis Date Noted  ? Labral tear of shoulder, degenerative, left   ? Plantar fasciitis 12/20/2018  ? Tightness of right gastrocnemius muscle 12/20/2018  ? Shoulder separation 12/20/2018  ? ? ?PCP: Billie Ruddy, MD ? ?REFERRING PROVIDER: Vanetta Mulders, MD ? ?REFERRING DIAG: s/p Labral repair ? ?THERAPY DIAG:  ?Acute pain of left shoulder ? ?Stiffness of left shoulder, not elsewhere classified ? ? ?ONSET DATE: 11/15/21-surgery ? ?SUBJECTIVE:                                                                                                                                                                                     ? ?SUBJECTIVE STATEMENT: ?Pt states that shoulder is a little stiff from sitting at work. He was minimally sore from last session.  ? ? ?PERTINENT HISTORY: ?none ? ?PAIN:  ?Are you having pain? No  ?NPRS scale:0/10 ?Pain location: Lt shoulder ?PAIN TYPE: aching  ?Aggravating factors:  being up and around ?Relieving factors: ice, meds ? ?PRECAUTIONS: Shoulder ? ?WEIGHT BEARING RESTRICTIONS Yes labral repair ? ? 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 ? ?PATIENT GOALS exercise, sports- basketball, running, baseball ? ?OBJECTIVE:  ?PATIENT SURVEYS:  ?FOTO 30 ?UPPER EXTREMITY AROM/PROM: ?  ?A/PROM Right ?12/16/21  ?Shoulder flexion 140  ?Shoulder extension    ?Shoulder abduction 125   ?Shoulder adduction    ?Shoulder internal rotation 40  ?Shoulder external rotation 35  ?(  Blank rows = not tested) ? ?  ?TODAY'S TREATMENT:  ? ?3/20  ?(8-12 week period ROM as tolerated, light strengthening) ? ?PROM up to 165 flexion, ABD 140 ?L GHJ Post & Inf mobs grade IV ? ? ? ?ER dowel 5s 2x10 ?Pulleys ABD 3 min ?Standing pec stretch low 30s 3x ?Prone Y and T 2x10 ?AROM ABD 2x10 to 90-95 deg  ?Wall push up 2x10 ?Quadruped weight shifting med and lat 2x10 ? ?3/13  ?(PROM protocol Limits: FF&ABD 160, ER 45, IR behind back to waist) ? ?PROM up to 145 flexion, ABD 120 ?L GHJ Post & Inf mobs grade IV ? ? ?Supine protraction 3lbs 2x10 ?Standing pec stretch low 30s 3x ?Wall ladder scaption and ABD 3s 10x ?Standing bicep table stretch 30s 3x ?IR/ER YTB 2x10 ?Wall ball circle 20s CW and CCW ? ? ? ?PATIENT EDUCATION: ?Education details: anatomy, exercise progression, DOMS expectations, muscle firing,  HEP ?Person educated: Patient ?Education method: Explanation, Demonstration, Tactile cues, Verbal cues, and Handouts ?Education comprehension: verbalized understanding, returned demonstration, verbal cues required, tactile cues required, and needs further education ? ? ?HOME EXERCISE PROGRAM: ?Access Code: [email or web address] ?URL: [email or web address] ?Date: 01/13/2022 ?Prepared by: Daleen Bo ? ?Exercises ?- Doorway Pec Stretch at 60 Degrees Abduction with Arm Straight  - 1 x daily - 7 x weekly - 1 sets - 3 reps - 30 hold ?- Standing Shoulder Abduction Slides at Wall  - 1 x daily - 7 x weekly - 1 sets - 10 reps - 3  hold ?- Standing Shoulder Row with Anchored Resistance  - 1 x daily - 5 x weekly - 3 sets - 10 reps ?- Shoulder External Rotation with Anchored Resistance  - 1 x daily - 5 x weekly - 2 sets - 10 reps ?- Shoulder Internal Rotation with Resistance  - 1 x daily - 5 x weekly - 2 sets - 10 reps ?- Prone Single Arm Shoulder Y  - 1 x daily - 5 x weekly - 3 sets - 10 reps ?- Prone Single Arm Shoulder Horizontal Abduction with Scapular Retraction and Palm Down  - 1 x daily - 5 x weekly - 3 sets - 10 reps ? ? ? ?ASSESSMENT: ? ?CLINICAL IMPRESSION: ?Pt is 8 weeks at this time. Pt is able to reach near 180 into flexion but 130 in ABD that is strength vs joint stiffness related. Pt did respond well to grade IV GHJ mobilization with improvement in A/A/PROM following. Pt able to progress strengthening at today's session as well without increased pain- just fatigue once past shoulder level. Pt with tetany especially during CKC. Plan to continue with strength and ROM as tolerated. HEP updated at this time.  ? ? ?Objective impairments include decreased ROM, decreased strength, increased muscle spasms, impaired flexibility, impaired UE functional use, improper body mechanics, postural dysfunction, and pain. These impairments are limiting patient from cleaning, meal prep, occupation, yard work, and physical fitness . Personal factors including 1 comorbidity: h/o shoulder separation  are also affecting patient's functional outcome. Patient will benefit from skilled PT to address above impairments and improve overall function. ? ?REHAB POTENTIAL: Good ? ?CLINICAL DECISION MAKING: Stable/uncomplicated ? ?EVALUATION COMPLEXITY: Low ? ? ?GOALS: ?Goals reviewed with patient? Yes ? ?SHORT TERM GOALS: ? ?STG Name Target Date Goal status  ?1 Passive & AA flexion to 90 without increased pain ?Baseline: unable at eval 02/10/2022 MET  ?2 Demo proper activation of periscapular musculature while performing isometrics ?Baseline: will progress into  these  exercises as appropriate 02/10/2022 MET  ?3 Pt will verbalize compliance with & understanding of restrictions while performing ADLs ?Baseline:will educate as appropriate 02/10/2022 MET  ?     ?     ?     ?     ? ?LONG TERM GOALS:  ? ?LTG Name Target Date Goal status  ?1 Flexion to 160 with minimal to no discomfort ?Baseline: no motion at eval 03/10/2022 ongoing  ?2 Able to reach center belt loop behind back without compensation ?Baseline:unable at eval 03/10/2022 ongoing  ?3 Able to demo scapular control in lifting and maneuvering 5lb weight at and above shoulder height ?Baseline:unable at eval 04/07/2022 ongoing  ?4 Will begin plyometric and CKC exercises  ?Baseline: will progress as appropriate 04/07/2022 ongoing  ?5 PT and pt will collaborate to create further objective goals  ?Baseline: to be set at later date 04/07/2022 ongoing  ?     ?     ? ?PLAN: ?PT FREQUENCY: 1-2x/week ? ?PT DURATION: 12 weeks ? ?PLANNED INTERVENTIONS: Therapeutic exercises, Therapeutic activity, Neuro Muscular re-education, Patient/Family education, Joint mobilization, Aquatic Therapy, Dry Needling, Electrical stimulation, Cryotherapy, Moist heat, scar mobilization, Taping, Vasopneumatic device, and Manual therapy ? ?PLAN FOR NEXT SESSION(s): revisit manual, continue with ROM to limits, light resistance review ? ?Daleen Bo PT, DPT ?01/13/22 9:31 AM ? ? ? ? ?

## 2022-01-16 ENCOUNTER — Encounter (HOSPITAL_BASED_OUTPATIENT_CLINIC_OR_DEPARTMENT_OTHER): Payer: Self-pay | Admitting: Physical Therapy

## 2022-01-16 ENCOUNTER — Other Ambulatory Visit: Payer: Self-pay

## 2022-01-16 ENCOUNTER — Ambulatory Visit (HOSPITAL_BASED_OUTPATIENT_CLINIC_OR_DEPARTMENT_OTHER): Payer: 59 | Admitting: Physical Therapy

## 2022-01-16 DIAGNOSIS — M25512 Pain in left shoulder: Secondary | ICD-10-CM | POA: Diagnosis not present

## 2022-01-16 DIAGNOSIS — M25612 Stiffness of left shoulder, not elsewhere classified: Secondary | ICD-10-CM

## 2022-01-16 NOTE — Therapy (Signed)
?OUTPATIENT PHYSICAL TREATMENT NOTE ? ? ?Patient Name: Gary Hayes ?MRN: [id number] ?DOB:1976-07-18, 46 y.o., male ?Today's Date: 01/16/2022 ? ? PT End of Session - 01/16/22 0905   ? ? Visit Number 14   ? Number of Visits 33   ? Date for PT Re-Evaluation 03/10/22   ? Authorization Type UHC   ? PT Start Time 0845   ? PT Stop Time [phone]   ? PT Time Calculation (min) 38 min   ? Activity Tolerance Patient tolerated treatment well   ? Behavior During Therapy [facility] for tasks assessed/performed   ? ?  ?  ? ?  ? ? ? ? ? ? ? ? ? ? ? ? ? ? ?Past Medical History:  ?Diagnosis Date  ? Arthritis   ? bilateral shoulders  ? Chicken pox   ? Frequent headaches   ? GERD (gastroesophageal reflux disease)   ? Hay fever   ? ?Past Surgical History:  ?Procedure Laterality Date  ? SHOULDER ARTHROSCOPY WITH LABRAL REPAIR Left 11/15/2021  ? Procedure: left SHOULDER ARTHROSCOPY WITH LABRAL REPAIR;  Surgeon: Vanetta Mulders, MD;  Location: [place];  Service: Orthopedics;  Laterality: Left;  ? WISDOM TOOTH EXTRACTION    ? ?Patient Active Problem List  ? Diagnosis Date Noted  ? Labral tear of shoulder, degenerative, left   ? Plantar fasciitis 12/20/2018  ? Tightness of right gastrocnemius muscle 12/20/2018  ? Shoulder separation 12/20/2018  ? ? ?PCP: Billie Ruddy, MD ? ?REFERRING PROVIDER: Vanetta Mulders, MD ? ?REFERRING DIAG: s/p Labral repair ? ?THERAPY DIAG:  ?Acute pain of left shoulder ? ?Stiffness of left shoulder, not elsewhere classified ? ? ?ONSET DATE: 11/15/21-surgery ? ?SUBJECTIVE:                                                                                                                                                                                     ? ?SUBJECTIVE STATEMENT: ?Pt states his shoulder is a little irritated this weekend. He had some trouble with ant shoulder discomfort doing the Y and the T.  ? ? ?PERTINENT HISTORY: ?none ? ?PAIN:  ?Are you having pain? Yes  ?NPRS scale: 2/10 ?Pain location: Lt shoulder ?PAIN TYPE:  aching  ?Aggravating factors: being up and around ?Relieving factors: ice, meds ? ?PRECAUTIONS: Shoulder ? ?WEIGHT BEARING RESTRICTIONS Yes labral repair ? ? 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 ? ?PATIENT GOALS exercise, sports- basketball, running, baseball ? ?OBJECTIVE:  ?PATIENT SURVEYS:  ?FOTO 30 ? ?  ?TODAY'S TREATMENT:  ? ?3/27 ?(8-12 week period ROM as tolerated, light strengthening) ? ? ?L GHJ Post & Inf mobs grade IV ?STM L pec major and  pec minor ? ? ?UBE L1 46mn retro and fwd  ?90/90 doorway pec stretch ?ER dowel at 75 deg ABD 5s 10x ?Sleeper stretch 15s 4x ?Dowel BHB IR stretch 5s 10x ?Standing cane extension 2x10 3s  ?Prone Y and T 2x10 ?Doorway self pec with tennis ball 2 min ? ?(On hold) ?AROM ABD 2x10 to 90-95 deg  ?Wall push up 2x10 ?Quadruped weight shifting med and lat 2x10 ? ? ?PATIENT EDUCATION: ?Education details: relevant anatomy, exercise progression, DOMS expectations, muscle firing,  HEP ?Person educated: Patient ?Education method: Explanation, Demonstration, Tactile cues, Verbal cues, and Handouts ?Education comprehension: verbalized understanding, returned demonstration, verbal cues required, tactile cues required, and needs further education ? ? ?HOME EXERCISE PROGRAM: ?Access Code: [email or web address]?URL: [email or web address] ?Date: 01/13/2022 ?Prepared by: ADaleen Bo? ?Exercises ?- Doorway Pec Stretch at 60 Degrees Abduction with Arm Straight  - 1 x daily - 7 x weekly - 1 sets - 3 reps - 30 hold ?- Standing Shoulder Abduction Slides at Wall  - 1 x daily - 7 x weekly - 1 sets - 10 reps - 3 hold ?- Standing Shoulder Row with Anchored Resistance  - 1 x daily - 5 x weekly - 3 sets - 10 reps ?- Shoulder External Rotation with Anchored Resistance  - 1 x daily - 5 x weekly - 2 sets - 10 reps ?- Shoulder Internal Rotation with Resistance  - 1 x daily - 5 x weekly - 2 sets - 10 reps ?- Prone Single Arm Shoulder Y  - 1 x daily - 5 x weekly - 3 sets - 10 reps ?- Prone Single Arm  Shoulder Horizontal Abduction with Scapular Retraction and Palm Down  - 1 x daily - 5 x weekly - 3 sets - 10 reps ? ? ? ?ASSESSMENT: ? ?CLINICAL IMPRESSION: ?Pt is 8 weeks at this time. Pt presents today with generalized shoulder stiffness following new lifting exercise at home. Pt had improvement in pain with prone T following manual therapy and ER/IR stretching. Pt HEP updated accordingly. Pt's pain is related to stiffness/ use given recent addition of anti-gravity exericise. Plan to continue with gentle stretching and strength as tolerated. HEP strengthening freq reduced to 3x/week.  ? ? ?Objective impairments include decreased ROM, decreased strength, increased muscle spasms, impaired flexibility, impaired UE functional use, improper body mechanics, postural dysfunction, and pain. These impairments are limiting patient from cleaning, meal prep, occupation, yard work, and physical fitness . Personal factors including 1 comorbidity: h/o shoulder separation  are also affecting patient's functional outcome. Patient will benefit from skilled PT to address above impairments and improve overall function. ? ?REHAB POTENTIAL: Good ? ?CLINICAL DECISION MAKING: Stable/uncomplicated ? ?EVALUATION COMPLEXITY: Low ? ? ?GOALS: ?Goals reviewed with patient? Yes ? ?SHORT TERM GOALS: ? ?STG Name Target Date Goal status  ?1 Passive & AA flexion to 90 without increased pain ?Baseline: unable at eval 02/13/2022 MET  ?2 Demo proper activation of periscapular musculature while performing isometrics ?Baseline: will progress into these exercises as appropriate 02/13/2022 MET  ?3 Pt will verbalize compliance with & understanding of restrictions while performing ADLs ?Baseline:will educate as appropriate 02/13/2022 MET  ?     ?     ?     ?     ? ?LONG TERM GOALS:  ? ?LTG Name Target Date Goal status  ?1 Flexion to 160 with minimal to no discomfort ?Baseline: no motion at eval 03/13/2022 ongoing  ?2 Able to reach center belt loop behind back  without compensation ?Baseline:unable at eval 03/13/2022 ongoing  ?3 Able to demo scapular control in lifting and maneuvering 5lb weight at and above shoulder height ?Baseline:unable at eval 04/10/2022 ongoing  ?4 Will begin plyometric and CKC exercises  ?Baseline: will progress as appropriate 04/10/2022 ongoing  ?5 PT and pt will collaborate to create further objective goals  ?Baseline: to be set at later date 04/10/2022 ongoing  ?     ?     ? ?PLAN: ?PT FREQUENCY: 1-2x/week ? ?PT DURATION: 12 weeks ? ?PLANNED INTERVENTIONS: Therapeutic exercises, Therapeutic activity, Neuro Muscular re-education, Patient/Family education, Joint mobilization, Aquatic Therapy, Dry Needling, Electrical stimulation, Cryotherapy, Moist heat, scar mobilization, Taping, Vasopneumatic device, and Manual therapy ? ?PLAN FOR NEXT SESSION(s): revisit manual, continue with ROM to limits, light resistance review ? ?Daleen Bo PT, DPT ?01/16/22 9:27 AM ? ? ? ? ?

## 2022-01-20 ENCOUNTER — Encounter (HOSPITAL_BASED_OUTPATIENT_CLINIC_OR_DEPARTMENT_OTHER): Payer: Self-pay | Admitting: Physical Therapy

## 2022-01-20 ENCOUNTER — Ambulatory Visit (HOSPITAL_BASED_OUTPATIENT_CLINIC_OR_DEPARTMENT_OTHER): Payer: 59 | Admitting: Physical Therapy

## 2022-01-20 DIAGNOSIS — M25512 Pain in left shoulder: Secondary | ICD-10-CM | POA: Diagnosis not present

## 2022-01-20 DIAGNOSIS — M25612 Stiffness of left shoulder, not elsewhere classified: Secondary | ICD-10-CM

## 2022-01-20 NOTE — Therapy (Signed)
?OUTPATIENT PHYSICAL TREATMENT NOTE ? ? ?Patient Name: Gary Hayes ?MRN: [id number] ?DOB:[birth year], 46 y.o., male ?Today's Date: 01/20/2022 ? ? PT End of Session - 01/20/22 0844   ? ? Visit Number 15   ? Number of Visits 33   ? Date for PT Re-Evaluation 03/10/22   ? Authorization Type UHC   ? PT Start Time 0845   ? PT Stop Time 0925   ? PT Time Calculation (min) 40 min   ? Activity Tolerance Patient tolerated treatment well   ? Behavior During Therapy [facility] for tasks assessed/performed   ? ?  ?  ? ?  ? ? ? ? ? ? ? ? ? ? ? ? ? ? ?Past Medical History:  ?Diagnosis Date  ? Arthritis   ? bilateral shoulders  ? Chicken pox   ? Frequent headaches   ? GERD (gastroesophageal reflux disease)   ? Hay fever   ? ?Past Surgical History:  ?Procedure Laterality Date  ? SHOULDER ARTHROSCOPY WITH LABRAL REPAIR Left 11/15/2021  ? Procedure: left SHOULDER ARTHROSCOPY WITH LABRAL REPAIR;  Surgeon: Vanetta Mulders, MD;  Location: [place];  Service: Orthopedics;  Laterality: Left;  ? WISDOM TOOTH EXTRACTION    ? ?Patient Active Problem List  ? Diagnosis Date Noted  ? Labral tear of shoulder, degenerative, left   ? Plantar fasciitis 12/20/2018  ? Tightness of right gastrocnemius muscle 12/20/2018  ? Shoulder separation 12/20/2018  ? ? ?PCP: Billie Ruddy, MD ? ?REFERRING PROVIDER: Vanetta Mulders, MD ? ?REFERRING DIAG: s/p Labral repair ? ?THERAPY DIAG:  ?Acute pain of left shoulder ? ?Stiffness of left shoulder, not elsewhere classified ? ? ?ONSET DATE: 11/15/21-surgery ? ?SUBJECTIVE:                                                                                                                                                                                     ? ?SUBJECTIVE STATEMENT: ?Pt states his shoulder feels better after focusing on stretching and loosening up the shoulder. He has had less pain and soreness.  ? ? ?PERTINENT HISTORY: ?none ? ?PAIN:  ?Are you having pain? No  ?NPRS scale: 0/10 ?Pain location: Lt shoulder ?PAIN TYPE:  aching  ?Aggravating factors: being up and around ?Relieving factors: ice, meds ? ?PRECAUTIONS: Shoulder ? ?WEIGHT BEARING RESTRICTIONS Yes labral repair ? ? 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 ? ?PATIENT GOALS exercise, sports- basketball, running, baseball ? ?OBJECTIVE:  ?PATIENT SURVEYS:  ?FOTO 30 ? ?  ?TODAY'S TREATMENT:  ?3/31 ?(8-12 week period ROM as tolerated, light strengthening) ? ? ?L GHJ Post & Inf mobs grade IV, ant grade III at 45 deg ABD- pain  any higher than that ? ? ?UBE L1 29mn retro and fwd  ?ABD shoulder pulley 3 min, slightly posterior  ?Supine hand behind head pec stretch 30s 3x ?Standing W (90/90 horizontal ABD) 15x ?S/L ER 2lbs 2x10 ?ER dowel at 75 deg ABD 5s 10x ?Dowel BHB IR stretch 5s 10x ?Standing cane extension 2x10 5s  ?Prone Y and T 2x10 1lb  ? ?3/27 ?(8-12 week period ROM as tolerated, light strengthening) ? ? ?L GHJ Post & Inf mobs grade IV ?STM L pec major and pec minor ? ? ?UBE L1 263m retro and fwd  ?90/90 doorway pec stretch ?ER dowel at 75 deg ABD 5s 10x ?Sleeper stretch 15s 4x ?Dowel BHB IR stretch 5s 10x ?Standing cane extension 2x10 3s  ?Prone Y and T 2x10 ?Doorway self pec with tennis ball 2 min ? ?(On hold) ?AROM ABD 2x10 to 90-95 deg  ?Wall push up 2x10 ?Quadruped weight shifting med and lat 2x10 ? ? ?PATIENT EDUCATION: ?Education details: relevant anatomy, exercise progression, ROM focus, HEP ?Person educated: Patient ?Education method: Explanation, Demonstration, Tactile cues, Verbal cues, and Handouts ?Education comprehension: verbalized understanding, returned demonstration, verbal cues required, tactile cues required, and needs further education ? ? ?HOME EXERCISE PROGRAM: ?Access Code: [email or web address]: [email or web address] ?Date: 01/13/2022 ?Prepared by: AlDaleen Bo ?Exercises ?- Doorway Pec Stretch at 60 Degrees Abduction with Arm Straight  - 1 x daily - 7 x weekly - 1 sets - 3 reps - 30 hold ?- Standing Shoulder Abduction Slides at Wall  - 1  x daily - 7 x weekly - 1 sets - 10 reps - 3 hold ?- Standing Shoulder Row with Anchored Resistance  - 1 x daily - 5 x weekly - 3 sets - 10 reps ?- Shoulder External Rotation with Anchored Resistance  - 1 x daily - 5 x weekly - 2 sets - 10 reps ?- Shoulder Internal Rotation with Resistance  - 1 x daily - 5 x weekly - 2 sets - 10 reps ?- Prone Single Arm Shoulder Y  - 1 x daily - 5 x weekly - 3 sets - 10 reps ?- Prone Single Arm Shoulder Horizontal Abduction with Scapular Retraction and Palm Down  - 1 x daily - 5 x weekly - 3 sets - 10 reps ? ? ? ?ASSESSMENT: ? ?CLINICAL IMPRESSION: ?Pt is 8.5 weeks at this time. Pt with good response to new ant GHJ mob at today's session with improvement in horizontal ABD at end of session. Pt at near full true frontal plane ABD. Pt able to reach greater ranges of ER and shoulder ext following manual therapy and exercise. Pt HEP unchanged at this time. Plan to add in new strengthening from today if no adverse effects. Plan to continue with aggressive ant mobilization as tolerated.  ? ? ?Objective impairments include decreased ROM, decreased strength, increased muscle spasms, impaired flexibility, impaired UE functional use, improper body mechanics, postural dysfunction, and pain. These impairments are limiting patient from cleaning, meal prep, occupation, yard work, and physical fitness . Personal factors including 1 comorbidity: h/o shoulder separation  are also affecting patient's functional outcome. Patient will benefit from skilled PT to address above impairments and improve overall function. ? ?REHAB POTENTIAL: Good ? ?CLINICAL DECISION MAKING: Stable/uncomplicated ? ?EVALUATION COMPLEXITY: Low ? ? ?GOALS: ?Goals reviewed with patient? Yes ? ?SHORT TERM GOALS: ? ?STG Name Target Date Goal status  ?1 Passive & AA flexion to 90 without increased pain ?Baseline: unable at eval 02/17/2022 MET  ?  2 Demo proper activation of periscapular musculature while performing  isometrics ?Baseline: will progress into these exercises as appropriate 02/17/2022 MET  ?3 Pt will verbalize compliance with & understanding of restrictions while performing ADLs ?Baseline:will educate as appropriate 02/17/2022 MET  ?     ?     ?     ?     ? ?LONG TERM GOALS:  ? ?LTG Name Target Date Goal status  ?1 Flexion to 160 with minimal to no discomfort ?Baseline: no motion at eval 03/17/2022 ongoing  ?2 Able to reach center belt loop behind back without compensation ?Baseline:unable at eval 03/17/2022 ongoing  ?3 Able to demo scapular control in lifting and maneuvering 5lb weight at and above shoulder height ?Baseline:unable at eval 04/14/2022 ongoing  ?4 Will begin plyometric and CKC exercises  ?Baseline: will progress as appropriate 04/14/2022 ongoing  ?5 PT and pt will collaborate to create further objective goals  ?Baseline: to be set at later date 04/14/2022 ongoing  ?     ?     ? ?PLAN: ?PT FREQUENCY: 1-2x/week ? ?PT DURATION: 12 weeks ? ?PLANNED INTERVENTIONS: Therapeutic exercises, Therapeutic activity, Neuro Muscular re-education, Patient/Family education, Joint mobilization, Aquatic Therapy, Dry Needling, Electrical stimulation, Cryotherapy, Moist heat, scar mobilization, Taping, Vasopneumatic device, and Manual therapy ? ?PLAN FOR NEXT SESSION(s): revisit manual, continue with ROM to limits, light resistance review ? ?Daleen Bo PT, DPT ?01/20/22 9:29 AM ? ? ? ? ?

## 2022-01-23 ENCOUNTER — Ambulatory Visit (HOSPITAL_BASED_OUTPATIENT_CLINIC_OR_DEPARTMENT_OTHER): Payer: 59 | Attending: Orthopaedic Surgery | Admitting: Physical Therapy

## 2022-01-23 ENCOUNTER — Encounter (HOSPITAL_BASED_OUTPATIENT_CLINIC_OR_DEPARTMENT_OTHER): Payer: Self-pay | Admitting: Physical Therapy

## 2022-01-23 DIAGNOSIS — M25512 Pain in left shoulder: Secondary | ICD-10-CM | POA: Insufficient documentation

## 2022-01-23 DIAGNOSIS — M25612 Stiffness of left shoulder, not elsewhere classified: Secondary | ICD-10-CM | POA: Insufficient documentation

## 2022-01-23 NOTE — Therapy (Signed)
?OUTPATIENT PHYSICAL TREATMENT NOTE ? ? ?Patient Name: Gary Hayes ?MRN: [id number] ?DOB:Dec 16, 1975, 46 y.o., male ?Today's Date: 01/23/2022 ? ? PT End of Session - 01/23/22 0843   ? ? Visit Number 16   ? Number of Visits 33   ? Date for PT Re-Evaluation 03/10/22   ? Authorization Type UHC   ? PT Start Time 0845   ? PT Stop Time 0925   ? PT Time Calculation (min) 40 min   ? Activity Tolerance Patient tolerated treatment well   ? Behavior During Therapy [facility] for tasks assessed/performed   ? ?  ?  ? ?  ? ? ? ? ? ? ? ? ? ? ? ? ? ? ?Past Medical History:  ?Diagnosis Date  ? Arthritis   ? bilateral shoulders  ? Chicken pox   ? Frequent headaches   ? GERD (gastroesophageal reflux disease)   ? Hay fever   ? ?Past Surgical History:  ?Procedure Laterality Date  ? SHOULDER ARTHROSCOPY WITH LABRAL REPAIR Left 11/15/2021  ? Procedure: left SHOULDER ARTHROSCOPY WITH LABRAL REPAIR;  Surgeon: Vanetta Mulders, MD;  Location: [place];  Service: Orthopedics;  Laterality: Left;  ? WISDOM TOOTH EXTRACTION    ? ?Patient Active Problem List  ? Diagnosis Date Noted  ? Labral tear of shoulder, degenerative, left   ? Plantar fasciitis 12/20/2018  ? Tightness of right gastrocnemius muscle 12/20/2018  ? Shoulder separation 12/20/2018  ? ? ?PCP: Billie Ruddy, MD ? ?REFERRING PROVIDER: Vanetta Mulders, MD ? ?REFERRING DIAG: s/p Labral repair ? ?THERAPY DIAG:  ?Acute pain of left shoulder ? ?Stiffness of left shoulder, not elsewhere classified ? ? ?ONSET DATE: 11/15/21-surgery ? ?SUBJECTIVE:                                                                                                                                                                                     ? ?SUBJECTIVE STATEMENT: ?Pt states his shoulder is stretching further but still having tightness with ABD and 90/90 ER position. ? ? ?PERTINENT HISTORY: ?none ? ?PAIN:  ?Are you having pain? No  ?NPRS scale: 0/10 ?Pain location: Lt shoulder ?PAIN TYPE: aching  ?Aggravating factors:  being up and around ?Relieving factors: ice, meds ? ?PRECAUTIONS: Shoulder ? ?WEIGHT BEARING RESTRICTIONS Yes labral repair ? ? 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 ? ?PATIENT GOALS exercise, sports- basketball, running, baseball ? ?OBJECTIVE:  ?PATIENT SURVEYS:  ?FOTO 30 ? ?  ?TODAY'S TREATMENT:  ? ?4/3 ? ?(8-12 week period ROM as tolerated, light strengthening) ? ? ?L GHJ Inf mobs grade IV in horizontal ABD, ant grade III at 75 deg ABD ? ?UBE L1 22mn  retro and fwd  ?Supine hand behind head pec stretch 30s 3x ?Standing W (90/90 horizontal ABD) 15x ?S/L ER 2lbs 2x10 ?Supine 2lb DB flexion 2x10 ?ER dowel at 75 deg ABD 5s 10x ?Dowel BHB IR stretch 5s 10x ?Standing cane extension 2x10 5s  ?Quadruped shoulder taps 2x10 ?Wall slide with lift off 10x (thumb up) ? ?3/31 ?(8-12 week period ROM as tolerated, light strengthening) ? ? ?L GHJ Post & Inf mobs grade IV, ant grade III at 45 deg ABD- pain any higher than that ? ? ?UBE L1 78mn retro and fwd  ?ABD shoulder pulley 3 min, slightly posterior  ?Supine hand behind head pec stretch 30s 3x ?Standing W (90/90 horizontal ABD) 15x ?S/L ER 2lbs 2x10 ?ER dowel at 75 deg ABD 5s 10x ?Dowel BHB IR stretch 5s 10x ?Standing cane extension 2x10 5s  ?Prone Y and T 2x10 1lb  ? ?3/27 ?(8-12 week period ROM as tolerated, light strengthening) ? ? ?L GHJ Post & Inf mobs grade IV ?STM L pec major and pec minor ? ? ?UBE L1 260m retro and fwd  ?90/90 doorway pec stretch ?ER dowel at 75 deg ABD 5s 10x ?Sleeper stretch 15s 4x ?Dowel BHB IR stretch 5s 10x ?Standing cane extension 2x10 3s  ?Prone Y and T 2x10 ?Doorway self pec with tennis ball 2 min ? ?(On hold) ?AROM ABD 2x10 to 90-95 deg  ?Wall push up 2x10 ?Quadruped weight shifting med and lat 2x10 ? ? ?PATIENT EDUCATION: ?Education details: relevant anatomy, exercise progression, ROM focus, HEP ?Person educated: Patient ?Education method: Explanation, Demonstration, Tactile cues, Verbal cues, and Handouts ?Education comprehension:  verbalized understanding, returned demonstration, verbal cues required, tactile cues required, and needs further education ? ? ?HOME EXERCISE PROGRAM: ?Access Code: [email or web address]: [email or web address] ?Date: 01/23/2022 ?Prepared by: AlDaleen Bo ?Program Notes ?continue with pulleys ? ?Exercises ?- Doorway Pec Stretch at 90 Degrees Abduction  - 2 x daily - 7 x weekly - 1 sets - 3 reps - 30 hold ?- Standing Shoulder Row with Anchored Resistance  - 1 x daily - 3 x weekly - 3 sets - 10 reps ?- Shoulder External Rotation with Anchored Resistance  - 1 x daily - 3 x weekly - 2 sets - 10 reps ?- Shoulder Internal Rotation with Resistance  - 1 x daily - 3 x weekly - 2 sets - 10 reps ?- Prone Single Arm Shoulder Y  - 1 x daily - 3 x weekly - 3 sets - 10 reps ?- Prone Single Arm Shoulder Horizontal Abduction with Scapular Retraction and Palm Down  - 1 x daily - 3 x weekly - 3 sets - 10 reps ?- Quadruped Shoulder Taps  - 1 x daily - 3 x weekly - 3 sets - 10 reps ?- Supine Shoulder Flexion Extension Full Range AROM  - 1 x daily - 3 x weekly - 3 sets - 10 reps ? ? ? ?ASSESSMENT: ? ?CLINICAL IMPRESSION: ?Pt is 9 weeks at this time. Pt with good response to repeat of ant GHJ mob as well as inf mob with more horizontal ABD. Pt with improvement in true  front bland ABD. Pt at near full true frontal plane ABD but with report of tightness at 130 ABD. Pt able to progress strengthening and update HEP accordingly. Plan to continue with aggressive ant mobilization as tolerated and progress strength as able.   ? ? ?Objective impairments include decreased ROM, decreased strength, increased muscle spasms, impaired flexibility, impaired UE functional  use, improper body mechanics, postural dysfunction, and pain. These impairments are limiting patient from cleaning, meal prep, occupation, yard work, and physical fitness . Personal factors including 1 comorbidity: h/o shoulder separation  are also affecting patient's functional  outcome. Patient will benefit from skilled PT to address above impairments and improve overall function. ? ?REHAB POTENTIAL: Good ? ?CLINICAL DECISION MAKING: Stable/uncomplicated ? ?EVALUATION COMPLEXITY: Low ? ? ?GOALS: ?Goals reviewed with patient? Yes ? ?SHORT TERM GOALS: ? ?STG Name Target Date Goal status  ?1 Passive & AA flexion to 90 without increased pain ?Baseline: unable at eval 02/20/2022 MET  ?2 Demo proper activation of periscapular musculature while performing isometrics ?Baseline: will progress into these exercises as appropriate 02/20/2022 MET  ?3 Pt will verbalize compliance with & understanding of restrictions while performing ADLs ?Baseline:will educate as appropriate 02/20/2022 MET  ?     ?     ?     ?     ? ?LONG TERM GOALS:  ? ?LTG Name Target Date Goal status  ?1 Flexion to 160 with minimal to no discomfort ?Baseline: no motion at eval 03/20/2022 ongoing  ?2 Able to reach center belt loop behind back without compensation ?Baseline:unable at eval 03/20/2022 ongoing  ?3 Able to demo scapular control in lifting and maneuvering 5lb weight at and above shoulder height ?Baseline:unable at eval 04/17/2022 ongoing  ?4 Will begin plyometric and CKC exercises  ?Baseline: will progress as appropriate 04/17/2022 ongoing  ?5 PT and pt will collaborate to create further objective goals  ?Baseline: to be set at later date 04/17/2022 ongoing  ?     ?     ? ?PLAN: ?PT FREQUENCY: 1-2x/week ? ?PT DURATION: 12 weeks ? ?PLANNED INTERVENTIONS: Therapeutic exercises, Therapeutic activity, Neuro Muscular re-education, Patient/Family education, Joint mobilization, Aquatic Therapy, Dry Needling, Electrical stimulation, Cryotherapy, Moist heat, scar mobilization, Taping, Vasopneumatic device, and Manual therapy ? ?PLAN FOR NEXT SESSION(s): revisit manual, continue with ROM to limits, light resistance review ? ?Daleen Bo PT, DPT ?01/23/22 9:29 AM ? ? ? ? ?

## 2022-01-27 ENCOUNTER — Encounter (HOSPITAL_BASED_OUTPATIENT_CLINIC_OR_DEPARTMENT_OTHER): Payer: Self-pay | Admitting: Physical Therapy

## 2022-01-27 ENCOUNTER — Ambulatory Visit (HOSPITAL_BASED_OUTPATIENT_CLINIC_OR_DEPARTMENT_OTHER): Payer: 59 | Admitting: Physical Therapy

## 2022-01-27 DIAGNOSIS — M25512 Pain in left shoulder: Secondary | ICD-10-CM | POA: Diagnosis not present

## 2022-01-27 DIAGNOSIS — M25612 Stiffness of left shoulder, not elsewhere classified: Secondary | ICD-10-CM

## 2022-01-27 NOTE — Therapy (Signed)
?OUTPATIENT PHYSICAL TREATMENT NOTE ? ? ?Patient Name: Gary Hayes ?MRN: [id number] ?DOB:[birth year], 46 y.o., male ?Today's Date: 01/27/2022 ? ? PT End of Session - 01/27/22 0857   ? ? Visit Number 17   ? Number of Visits 33   ? Date for PT Re-Evaluation 03/10/22   ? Authorization Type UHC   ? PT Start Time 0845   ? PT Stop Time 3267   ? PT Time Calculation (min) 39 min   ? Activity Tolerance Patient tolerated treatment well   ? Behavior During Therapy [facility] for tasks assessed/performed   ? ?  ?  ? ?  ? ? ? ? ? ? ? ? ? ? ? ? ? ? ? ?Past Medical History:  ?Diagnosis Date  ? Arthritis   ? bilateral shoulders  ? Chicken pox   ? Frequent headaches   ? GERD (gastroesophageal reflux disease)   ? Hay fever   ? ?Past Surgical History:  ?Procedure Laterality Date  ? SHOULDER ARTHROSCOPY WITH LABRAL REPAIR Left 11/15/2021  ? Procedure: left SHOULDER ARTHROSCOPY WITH LABRAL REPAIR;  Surgeon: Vanetta Mulders, MD;  Location: [place];  Service: Orthopedics;  Laterality: Left;  ? WISDOM TOOTH EXTRACTION    ? ?Patient Active Problem List  ? Diagnosis Date Noted  ? Labral tear of shoulder, degenerative, left   ? Plantar fasciitis 12/20/2018  ? Tightness of right gastrocnemius muscle 12/20/2018  ? Shoulder separation 12/20/2018  ? ? ?PCP: Billie Ruddy, MD ? ?REFERRING PROVIDER: Vanetta Mulders, MD ? ?REFERRING DIAG: s/p Labral repair ? ?THERAPY DIAG:  ?Acute pain of left shoulder ? ?Stiffness of left shoulder, not elsewhere classified ? ? ?ONSET DATE: 11/15/21-surgery ? ?SUBJECTIVE:                                                                                                                                                                                     ? ?SUBJECTIVE STATEMENT: ?Pt states that lifting OH is much better. It is still tight but only at end range. Pt has minor soreness after last session.  ? ? ?PERTINENT HISTORY: ?none ? ?PAIN:  ?Are you having pain? No  ?NPRS scale: 0/10 ?Pain location: Lt shoulder ?PAIN TYPE: aching   ?Aggravating factors: being up and around ?Relieving factors: ice, meds ? ?PRECAUTIONS: Shoulder ? ?WEIGHT BEARING RESTRICTIONS Yes labral repair ? ? 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 ? ?PATIENT GOALS exercise, sports- basketball, running, baseball ? ?OBJECTIVE:  ?PATIENT SURVEYS:  ?FOTO 30 ? ?  ?TODAY'S TREATMENT:  ?4/7 ? ?(8-12 week period ROM as tolerated, light strengthening) ? ? ?L GHJ Inf mobs grade IV in horizontal ABD ? ? ?UBE  L1 67mn retro and fwd  ? ?Supine horizontal ABD stretch 30s 3x ?Wall push up 2x10 ?Wall slide with lift off 2x10 (thumb up) ?Doorway pec stretch PNF contract relax 2x5 3s hold ?Quadruped shoulder taps 2x10 ?S/L ER 2lbs 2x10 ?Scpation and ABD 2lbs 2x10 (1lb needed on 2nd set of ABD due to fatigue) ?Dowel BHB IR stretch 5s 10x ?Standing cane extension 2x10 5s with ADD ? ?4/3 ? ?(8-12 week period ROM as tolerated, light strengthening) ? ? ?L GHJ Inf mobs grade IV in horizontal ABD, ant grade III at 75 deg ABD ? ?UBE L1 2625m retro and fwd  ?Supine hand behind head pec stretch 30s 3x ?Standing W (90/90 horizontal ABD) 15x ?S/L ER 2lbs 2x10 ?Supine 2lb DB flexion 2x10 ?ER dowel at 75 deg ABD 5s 10x ?Dowel BHB IR stretch 5s 10x ?Standing cane extension 2x10 5s  ?Quadruped shoulder taps 2x10 ?Wall slide with lift off 10x (thumb up) ? ?3/31 ?(8-12 week period ROM as tolerated, light strengthening) ? ? ?L GHJ Post & Inf mobs grade IV, ant grade III at 45 deg ABD- pain any higher than that ? ? ?UBE L1 25m49mretro and fwd  ?ABD shoulder pulley 3 min, slightly posterior  ?Supine hand behind head pec stretch 30s 3x ?Standing W (90/90 horizontal ABD) 15x ?S/L ER 2lbs 2x10 ?ER dowel at 75 deg ABD 5s 10x ?Dowel BHB IR stretch 5s 10x ?Standing cane extension 2x10 5s  ?Prone Y and T 2x10 1lb  ? ?3/27 ?(8-12 week period ROM as tolerated, light strengthening) ? ? ?L GHJ Post & Inf mobs grade IV ?STM L pec major and pec minor ? ? ?UBE L1 25mi125metro and fwd  ?90/90 doorway pec stretch ?ER dowel  at 75 deg ABD 5s 10x ?Sleeper stretch 15s 4x ?Dowel BHB IR stretch 5s 10x ?Standing cane extension 2x10 3s  ?Prone Y and T 2x10 ?Doorway self pec with tennis ball 2 min ? ?(On hold) ?AROM ABD 2x10 to 90-95 deg  ?Wall push up 2x10 ?Quadruped weight shifting med and lat 2x10 ? ? ?PATIENT EDUCATION: ?Education details: relevant anatomy, exercise progression, ROM focus, HEP ?Person educated: Patient ?Education method: Explanation, Demonstration, Tactile cues, Verbal cues, and Handouts ?Education comprehension: verbalized understanding, returned demonstration, verbal cues required, tactile cues required, and needs further education ? ? ?HOME EXERCISE PROGRAM: ?Access Code: [email or web address]: [email or web address] ?Date: 01/27/2022 ?Prepared by: AlanDaleen BoProgram Notes ?continue with pulleys ? ?Exercises ?- Doorway Pec Stretch at 90 Degrees Abduction  - 2 x daily - 7 x weekly - 1 sets - 3 reps - 30 hold ?- Standing Shoulder Internal Rotation Stretch with Towel  - 2 x daily - 7 x weekly - 1 sets - 10 reps - 3 hold ?- Standing Shoulder Row with Anchored Resistance  - 1 x daily - 3 x weekly - 3 sets - 10 reps ?- Shoulder External Rotation with Anchored Resistance  - 1 x daily - 3 x weekly - 2 sets - 10 reps ?- Shoulder Internal Rotation with Resistance  - 1 x daily - 3 x weekly - 2 sets - 10 reps ?- Quadruped Shoulder Taps  - 1 x daily - 3 x weekly - 3 sets - 10 reps ?- Supine Shoulder Flexion Extension Full Range AROM  - 1 x daily - 3 x weekly - 3 sets - 10 reps ?- Scaption with Dumbbells  - 1 x daily - 3 x weekly - 2 sets - 10 reps ?-  Shoulder Abduction with Dumbbells - Thumbs Up  - 1 x daily - 3 x weekly - 2 sets - 10 reps ? ? ? ?ASSESSMENT: ? ?CLINICAL IMPRESSION: ?Pt is 9 weeks at this time. Pt able to reach to near full ABD and flexion at today's session with less ant shoulder stiffness limiting frontal plane motion. Pt able to introduce more CKC exercise as well as strengthening of the shoulder with OH  motions without pain. Pt is making fast progress with ROM and improving strength between each session. Plan to continue with gaining full frontal plane and OH motion in conjunction with strengthening. ? ? ?Objective impairments include decreased ROM, decreased strength, increased muscle spasms, impaired flexibility, impaired UE functional use, improper body mechanics, postural dysfunction, and pain. These impairments are limiting patient from cleaning, meal prep, occupation, yard work, and physical fitness . Personal factors including 1 comorbidity: h/o shoulder separation  are also affecting patient's functional outcome. Patient will benefit from skilled PT to address above impairments and improve overall function. ? ?REHAB POTENTIAL: Good ? ?CLINICAL DECISION MAKING: Stable/uncomplicated ? ?EVALUATION COMPLEXITY: Low ? ? ?GOALS: ?Goals reviewed with patient? Yes ? ?SHORT TERM GOALS: ? ?STG Name Target Date Goal status  ?1 Passive & AA flexion to 90 without increased pain ?Baseline: unable at eval 02/24/2022 MET  ?2 Demo proper activation of periscapular musculature while performing isometrics ?Baseline: will progress into these exercises as appropriate 02/24/2022 MET  ?3 Pt will verbalize compliance with & understanding of restrictions while performing ADLs ?Baseline:will educate as appropriate 02/24/2022 MET  ?     ?     ?     ?     ? ?LONG TERM GOALS:  ? ?LTG Name Target Date Goal status  ?1 Flexion to 160 with minimal to no discomfort ?Baseline: no motion at eval 03/24/2022 ongoing  ?2 Able to reach center belt loop behind back without compensation ?Baseline:unable at eval 03/24/2022 ongoing  ?3 Able to demo scapular control in lifting and maneuvering 5lb weight at and above shoulder height ?Baseline:unable at eval 04/21/2022 ongoing  ?4 Will begin plyometric and CKC exercises  ?Baseline: will progress as appropriate 04/21/2022 ongoing  ?5 PT and pt will collaborate to create further objective goals  ?Baseline: to be set  at later date 04/21/2022 ongoing  ?     ?     ? ?PLAN: ?PT FREQUENCY: 1-2x/week ? ?PT DURATION: 12 weeks ? ?PLANNED INTERVENTIONS: Therapeutic exercises, Therapeutic activity, Neuro Muscular re-education

## 2022-01-30 ENCOUNTER — Ambulatory Visit (HOSPITAL_BASED_OUTPATIENT_CLINIC_OR_DEPARTMENT_OTHER): Payer: 59 | Admitting: Physical Therapy

## 2022-01-30 ENCOUNTER — Encounter (HOSPITAL_BASED_OUTPATIENT_CLINIC_OR_DEPARTMENT_OTHER): Payer: Self-pay | Admitting: Physical Therapy

## 2022-01-30 DIAGNOSIS — M25512 Pain in left shoulder: Secondary | ICD-10-CM | POA: Diagnosis not present

## 2022-01-30 DIAGNOSIS — M25612 Stiffness of left shoulder, not elsewhere classified: Secondary | ICD-10-CM

## 2022-01-30 NOTE — Therapy (Signed)
?OUTPATIENT PHYSICAL TREATMENT NOTE ? ? ?Patient Name: Gary Hayes ?MRN: [id number] ?DOB:10-26-1975, 46 y.o., male ?Today's Date: 01/30/2022 ? ? PT End of Session - 01/30/22 0853   ? ? Visit Number 18   ? Number of Visits 33   ? Date for PT Re-Evaluation 03/10/22   ? Authorization Type UHC   ? PT Start Time [phone]   ? PT Stop Time 0929   ? PT Time Calculation (min) 38 min   ? Activity Tolerance Patient tolerated treatment well   ? Behavior During Therapy [facility] for tasks assessed/performed   ? ?  ?  ? ?  ? ? ? ? ? ? ? ? ? ? ? ? ? ? ? ?Past Medical History:  ?Diagnosis Date  ? Arthritis   ? bilateral shoulders  ? Chicken pox   ? Frequent headaches   ? GERD (gastroesophageal reflux disease)   ? Hay fever   ? ?Past Surgical History:  ?Procedure Laterality Date  ? SHOULDER ARTHROSCOPY WITH LABRAL REPAIR Left 11/15/2021  ? Procedure: left SHOULDER ARTHROSCOPY WITH LABRAL REPAIR;  Surgeon: Vanetta Mulders, MD;  Location: [place];  Service: Orthopedics;  Laterality: Left;  ? WISDOM TOOTH EXTRACTION    ? ?Patient Active Problem List  ? Diagnosis Date Noted  ? Labral tear of shoulder, degenerative, left   ? Plantar fasciitis 12/20/2018  ? Tightness of right gastrocnemius muscle 12/20/2018  ? Shoulder separation 12/20/2018  ? ? ?PCP: Billie Ruddy, MD ? ?REFERRING PROVIDER: Vanetta Mulders, MD ? ?REFERRING DIAG: s/p Labral repair ? ?THERAPY DIAG:  ?Acute pain of left shoulder ? ?Stiffness of left shoulder, not elsewhere classified ? ? ?ONSET DATE: 11/15/21-surgery ? ?SUBJECTIVE:                                                                                                                                                                                     ? ?SUBJECTIVE STATEMENT: ?Tightness and soreness in muscles, would not call it pain. More of an intense stretch and biceps tendon is irritated.  ? ? ?PERTINENT HISTORY: ?none ? ?PAIN:  ?Are you having pain? No  ?NPRS scale: 0/10 ?Pain location: Lt shoulder ?PAIN TYPE: aching   ?Aggravating factors: being up and around ?Relieving factors: ice, meds ? ?PRECAUTIONS: Shoulder ? ?WEIGHT BEARING RESTRICTIONS Yes labral repair ? ? 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 ? ?PATIENT GOALS exercise, sports- basketball, running, baseball ? ?OBJECTIVE:  ?PATIENT SURVEYS:  ?FOTO 30 ? ?GHJ Flexion AROM 140 ?Tindeq testing 4/10: ? Right (lb) Left (lb)  ?IR 23.8 10.4  ?ER 18.1 11  ?Pull 21.2 19  ?push 27.8 22.7  ?biceps 32.4 16.8  ?  Straight arm shoulder flexion 19.2 9 ?  ? ? ? ?  ?TODAY'S TREATMENT:  ?4/10 ?Tindeq testing ?Low trap set off of wall, 1lb ?Flexion stretch pull off of sink ?ER at side to 90 deg press up 1lb ?Triceps kicks ?MANUAL: sidelying scap mobilization with scapular distraction, supine pec stretch and STM ? ? ?4/7 ? ?(8-12 week period ROM as tolerated, light strengthening) ? ? ?L GHJ Inf mobs grade IV in horizontal ABD ? ? ?UBE L1 71mn retro and fwd  ? ?Supine horizontal ABD stretch 30s 3x ?Wall push up 2x10 ?Wall slide with lift off 2x10 (thumb up) ?Doorway pec stretch PNF contract relax 2x5 3s hold ?Quadruped shoulder taps 2x10 ?S/L ER 2lbs 2x10 ?Scpation and ABD 2lbs 2x10 (1lb needed on 2nd set of ABD due to fatigue) ?Dowel BHB IR stretch 5s 10x ?Standing cane extension 2x10 5s with ADD ? ? ? ?PATIENT EDUCATION: ?Education details: relevant anatomy, exercise progression, ROM focus, HEP ?Person educated: Patient ?Education method: Explanation, Demonstration, Tactile cues, Verbal cues, and Handouts ?Education comprehension: verbalized understanding, returned demonstration, verbal cues required, tactile cues required, and needs further education ? ? ?HOME EXERCISE PROGRAM: ?Access Code: [email or web address]?URL: [email or web address] ?Date: 01/27/2022 ?Prepared by: ADaleen Bo? ?Program Notes ?continue with pulleys ? ? ? ? ?ASSESSMENT: ? ?CLINICAL IMPRESSION: ?Tends toward elevation via upper trap use. Pt is very aware of deviations from proper motion and corrects with  minimal cuing- muscle testing today revealed significant weakness in comparison to opposite UE as expected at this time.  ? ? ?Objective impairments include decreased ROM, decreased strength, increased muscle spasms, impaired flexibility, impaired UE functional use, improper body mechanics, postural dysfunction, and pain. These impairments are limiting patient from cleaning, meal prep, occupation, yard work, and physical fitness . Personal factors including 1 comorbidity: h/o shoulder separation  are also affecting patient's functional outcome. Patient will benefit from skilled PT to address above impairments and improve overall function. ? ?REHAB POTENTIAL: Good ? ?CLINICAL DECISION MAKING: Stable/uncomplicated ? ?EVALUATION COMPLEXITY: Low ? ? ?GOALS: ?Goals reviewed with patient? Yes ? ?SHORT TERM GOALS: ? ?STG Name Target Date Goal status  ?1 Passive & AA flexion to 90 without increased pain ?Baseline: unable at eval 02/27/2022 MET  ?2 Demo proper activation of periscapular musculature while performing isometrics ?Baseline: will progress into these exercises as appropriate 02/27/2022 MET  ?3 Pt will verbalize compliance with & understanding of restrictions while performing ADLs ?Baseline:will educate as appropriate 02/27/2022 MET  ?     ?     ?     ?     ? ?LONG TERM GOALS:  ? ?LTG Name Target Date Goal status  ?1 Flexion to 160 with minimal to no discomfort ?Baseline: no motion at eval 03/27/2022 ongoing  ?2 Able to reach center belt loop behind back without compensation ?Baseline:unable at eval 03/27/2022 ongoing  ?3 Able to demo scapular control in lifting and maneuvering 5lb weight at and above shoulder height ?Baseline:unable at eval 04/24/2022 ongoing  ?4 Will begin plyometric and CKC exercises  ?Baseline: will progress as appropriate 04/24/2022 ongoing  ?5 PT and pt will collaborate to create further objective goals  ?Baseline: to be set at later date 04/24/2022 ongoing  ?     ?     ? ?PLAN: ?PT FREQUENCY:  1-2x/week ? ?PT DURATION: 12 weeks ? ?PLANNED INTERVENTIONS: Therapeutic exercises, Therapeutic activity, Neuro Muscular re-education, Patient/Family education, Joint mobilization, Aquatic Therapy, Dry Needling, Electrical stimulation, Cryotherapy, Moist heat, scar mobilization, Taping, Vasopneumatic  device, and Manual therapy ? ?PLAN FOR NEXT SESSION(s): manual PRN, resisted scapular motions, OH stability ? ?Irais Mottram C. Xavius Spadafore PT, DPT ?01/30/22 4:50 PM ? ? ? ? ?

## 2022-02-03 ENCOUNTER — Encounter (HOSPITAL_BASED_OUTPATIENT_CLINIC_OR_DEPARTMENT_OTHER): Payer: Self-pay | Admitting: Physical Therapy

## 2022-02-03 ENCOUNTER — Ambulatory Visit (HOSPITAL_BASED_OUTPATIENT_CLINIC_OR_DEPARTMENT_OTHER): Payer: 59 | Admitting: Physical Therapy

## 2022-02-03 DIAGNOSIS — M25512 Pain in left shoulder: Secondary | ICD-10-CM

## 2022-02-03 DIAGNOSIS — M25612 Stiffness of left shoulder, not elsewhere classified: Secondary | ICD-10-CM

## 2022-02-03 NOTE — Therapy (Signed)
?OUTPATIENT PHYSICAL TREATMENT NOTE ? ? ?Patient Name: Gary Hayes ?MRN: [id number] ?DOB:[birth year], 46 y.o., male ?Today's Date: 02/03/2022 ? ? PT End of Session - 02/03/22 0849   ? ? Visit Number 19   ? Number of Visits 33   ? Date for PT Re-Evaluation 03/10/22   ? Authorization Type UHC   ? PT Start Time [phone]   ? PT Stop Time 0926   ? PT Time Calculation (min) 40 min   ? Activity Tolerance Patient tolerated treatment well   ? Behavior During Therapy [facility] for tasks assessed/performed   ? ?  ?  ? ?  ? ? ? ? ? ? ? ? ? ? ? ? ? ? ? ?Past Medical History:  ?Diagnosis Date  ? Arthritis   ? bilateral shoulders  ? Chicken pox   ? Frequent headaches   ? GERD (gastroesophageal reflux disease)   ? Hay fever   ? ?Past Surgical History:  ?Procedure Laterality Date  ? SHOULDER ARTHROSCOPY WITH LABRAL REPAIR Left 11/15/2021  ? Procedure: left SHOULDER ARTHROSCOPY WITH LABRAL REPAIR;  Surgeon: Vanetta Mulders, MD;  Location: [place];  Service: Orthopedics;  Laterality: Left;  ? WISDOM TOOTH EXTRACTION    ? ?Patient Active Problem List  ? Diagnosis Date Noted  ? Labral tear of shoulder, degenerative, left   ? Plantar fasciitis 12/20/2018  ? Tightness of right gastrocnemius muscle 12/20/2018  ? Shoulder separation 12/20/2018  ? ? ?PCP: Billie Ruddy, MD ? ?REFERRING PROVIDER: Vanetta Mulders, MD ? ?REFERRING DIAG: s/p Labral repair ? ?THERAPY DIAG:  ?Acute pain of left shoulder ? ?Stiffness of left shoulder, not elsewhere classified ? ? ?ONSET DATE: 11/15/21-surgery ? ?SUBJECTIVE:                                                                                                                                                                                     ? ?SUBJECTIVE STATEMENT: ?Pt reports no pain or soreness after last session. Reaching OH just feels tight. ER/wall stretch position is getting easier.  ? ? ?PERTINENT HISTORY: ?none ? ?PAIN:  ?Are you having pain? No  ?NPRS scale: 0/10 ?Pain location: Lt shoulder ?PAIN TYPE:  aching  ?Aggravating factors: being up and around ?Relieving factors: ice, meds ? ?PRECAUTIONS: Shoulder ? ?WEIGHT BEARING RESTRICTIONS Yes labral repair ? ? 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 ? ?PATIENT GOALS exercise, sports- basketball, running, baseball ? ?OBJECTIVE:  ?PATIENT SURVEYS:  ?FOTO 30 ? ?GHJ Flexion AROM 140 ?Tindeq testing 4/10: ? Right (lb) Left (lb)  ?IR 23.8 10.4  ?ER 18.1 11  ?Pull 21.2 19  ?push 27.8 22.7  ?biceps 32.4 16.8  ?  Straight arm shoulder flexion 19.2 9 ?  ? ? ? ?  ?TODAY'S TREATMENT:  ?4/14 ? ?(8-12 week period ROM as tolerated, light strengthening) ? ?UBE Lvl 2 3 min fwd and retro ? ?Standing L arm GTB pec fly 2x10 with focus on eccentric ?Scap retraction with circles CW and CCW GTB 2x20 each ?Tall mat table height push ups 2x10 ?Flexion stretch pull off of sink ?Kneeling lat stretch with bar 5s 10x ? ?ER at side to 90 deg press up YTB at table 2x10 ?Self TB massage ant delt 6mn ? ?L GHJ Inf mobs grade IV in horizontal ABD ? ?4/10 ?Tindeq testing ?Low trap set off of wall, 1lb ?Flexion stretch pull off of sink ?ER at side to 90 deg press up 1lb ?Triceps kicks ?MANUAL: sidelying scap mobilization with scapular distraction, supine pec stretch and STM ? ? ?4/7 ? ?(8-12 week period ROM as tolerated, light strengthening) ? ? ?L GHJ Inf mobs grade IV in horizontal ABD ? ? ?UBE L1 234m retro and fwd  ? ?Supine horizontal ABD stretch 30s 3x ?Wall push up 2x10 ?Wall slide with lift off 2x10 (thumb up) ?Doorway pec stretch PNF contract relax 2x5 3s hold ?Quadruped shoulder taps 2x10 ?S/L ER 2lbs 2x10 ?Scpation and ABD 2lbs 2x10 (1lb needed on 2nd set of ABD due to fatigue) ?Dowel BHB IR stretch 5s 10x ?Standing cane extension 2x10 5s with ADD ? ? ? ?PATIENT EDUCATION: ?Education details: relevant anatomy, exercise progression, ROM focus, HEP ?Person educated: Patient ?Education method: Explanation, Demonstration, Tactile cues, Verbal cues, and Handouts ?Education comprehension:  verbalized understanding, returned demonstration, verbal cues required, tactile cues required, and needs further education ? ? ?HOME EXERCISE PROGRAM: ?Access Code: [email or web address]: [email or web address] ?Date: 01/27/2022 ?Prepared by: AlDaleen Bo ?Program Notes ?continue with pulleys ? ? ? ? ?ASSESSMENT: ? ?CLINICAL IMPRESSION: ?Session focused today on strengthening, especially into scapular and GHJ stabilizers given recent testing deficits. Pt able to continue with OH strength progression with expected weakness and tetany. Pt's ant delt/biceps irritation does appear consistent with potential overuse. STM does not change sensation of tightness. Pt able to increase weight of loading today. If increase stiffness occurs, consider  repeat of joint mobilizations at next session.  ? ? ?Objective impairments include decreased ROM, decreased strength, increased muscle spasms, impaired flexibility, impaired UE functional use, improper body mechanics, postural dysfunction, and pain. These impairments are limiting patient from cleaning, meal prep, occupation, yard work, and physical fitness . Personal factors including 1 comorbidity: h/o shoulder separation  are also affecting patient's functional outcome. Patient will benefit from skilled PT to address above impairments and improve overall function. ? ?REHAB POTENTIAL: Good ? ?CLINICAL DECISION MAKING: Stable/uncomplicated ? ?EVALUATION COMPLEXITY: Low ? ? ?GOALS: ?Goals reviewed with patient? Yes ? ?SHORT TERM GOALS: ? ?STG Name Target Date Goal status  ?1 Passive & AA flexion to 90 without increased pain ?Baseline: unable at eval 03/03/2022 MET  ?2 Demo proper activation of periscapular musculature while performing isometrics ?Baseline: will progress into these exercises as appropriate 03/03/2022 MET  ?3 Pt will verbalize compliance with & understanding of restrictions while performing ADLs ?Baseline:will educate as appropriate 03/03/2022 MET  ?     ?     ?     ?      ? ?LONG TERM GOALS:  ? ?LTG Name Target Date Goal status  ?1 Flexion to 160 with minimal to no discomfort ?Baseline: no motion at eval 03/31/2022 ongoing  ?2 Able to reach center  belt loop behind back without compensation ?Baseline:unable at eval 03/31/2022 ongoing  ?3 Able to demo scapular control in lifting and maneuvering 5lb weight at and above shoulder height ?Baseline:unable at eval 04/28/2022 ongoing  ?4 Will begin plyometric and CKC exercises  ?Baseline: will progress as appropriate 04/28/2022 ongoing  ?5 PT and pt will collaborate to create further objective goals  ?Baseline: to be set at later date 04/28/2022 ongoing  ?     ?     ? ?PLAN: ?PT FREQUENCY: 1-2x/week ? ?PT DURATION: 12 weeks ? ?PLANNED INTERVENTIONS: Therapeutic exercises, Therapeutic activity, Neuro Muscular re-education, Patient/Family education, Joint mobilization, Aquatic Therapy, Dry Needling, Electrical stimulation, Cryotherapy, Moist heat, scar mobilization, Taping, Vasopneumatic device, and Manual therapy ? ?PLAN FOR NEXT SESSION(s): manual PRN, resisted scapular motions, OH stability ? ?Daleen Bo PT, DPT ?02/03/22 9:26 AM ? ? ? ? ? ?

## 2022-02-06 ENCOUNTER — Encounter (HOSPITAL_BASED_OUTPATIENT_CLINIC_OR_DEPARTMENT_OTHER): Payer: Self-pay | Admitting: Physical Therapy

## 2022-02-06 ENCOUNTER — Ambulatory Visit (HOSPITAL_BASED_OUTPATIENT_CLINIC_OR_DEPARTMENT_OTHER): Payer: 59 | Admitting: Physical Therapy

## 2022-02-06 DIAGNOSIS — M25512 Pain in left shoulder: Secondary | ICD-10-CM | POA: Diagnosis not present

## 2022-02-06 DIAGNOSIS — M25612 Stiffness of left shoulder, not elsewhere classified: Secondary | ICD-10-CM

## 2022-02-06 NOTE — Therapy (Signed)
?OUTPATIENT PHYSICAL TREATMENT NOTE ? ? ?Patient Name: Gary Hayes ?MRN: [id number] ?DOB:[birth year], 46 y.o., male ?Today's Date: 02/06/2022 ? ? PT End of Session - 02/06/22 0848   ? ? Visit Number 20   ? Number of Visits 33   ? Date for PT Re-Evaluation 03/10/22   ? Authorization Type UHC   ? PT Start Time [phone]   ? PT Stop Time 0925   ? PT Time Calculation (min) 38 min   ? Activity Tolerance Patient tolerated treatment well   ? Behavior During Therapy [facility] for tasks assessed/performed   ? ?  ?  ? ?  ? ? ? ? ? ? ? ? ? ? ? ? ? ? ? ?Past Medical History:  ?Diagnosis Date  ? Arthritis   ? bilateral shoulders  ? Chicken pox   ? Frequent headaches   ? GERD (gastroesophageal reflux disease)   ? Hay fever   ? ?Past Surgical History:  ?Procedure Laterality Date  ? SHOULDER ARTHROSCOPY WITH LABRAL REPAIR Left 11/15/2021  ? Procedure: left SHOULDER ARTHROSCOPY WITH LABRAL REPAIR;  Surgeon: Vanetta Mulders, MD;  Location: [place];  Service: Orthopedics;  Laterality: Left;  ? WISDOM TOOTH EXTRACTION    ? ?Patient Active Problem List  ? Diagnosis Date Noted  ? Labral tear of shoulder, degenerative, left   ? Plantar fasciitis 12/20/2018  ? Tightness of right gastrocnemius muscle 12/20/2018  ? Shoulder separation 12/20/2018  ? ? ?PCP: Billie Ruddy, MD ? ?REFERRING PROVIDER: Vanetta Mulders, MD ? ?REFERRING DIAG: s/p Labral repair ? ?THERAPY DIAG:  ?Acute pain of left shoulder ? ?Stiffness of left shoulder, not elsewhere classified ? ? ?ONSET DATE: 11/15/21-surgery ? ?SUBJECTIVE:                                                                                                                                                                                     ? ?SUBJECTIVE STATEMENT: ?Just sore, esp in overhead.  ? ? ?PERTINENT HISTORY: ?none ? ?PAIN:  ?Are you having pain? No  ?NPRS scale: 0/10 ?Pain location: Lt shoulder ?PAIN TYPE: aching  ?Aggravating factors: being up and around ?Relieving factors: ice, meds ? ?PRECAUTIONS:  Shoulder ? ?WEIGHT BEARING RESTRICTIONS Yes labral repair ? ? 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 ? ?PATIENT GOALS exercise, sports- basketball, running, baseball ? ?OBJECTIVE:  ?PATIENT SURVEYS:  ?FOTO 30 ? ?GHJ Flexion AROM 140 ?Tindeq testing 4/10: ? Right (lb) Left (lb)  ?IR 23.8 10.4  ?ER 18.1 11  ?Pull 21.2 19  ?push 27.8 22.7  ?biceps 32.4 16.8  ?Straight arm shoulder flexion 19.2 9 ?  ? ? ? ?  ?TODAY'S TREATMENT:  ?  4/17: ?UBE 2 min retro ?MANUAL: post/inf joint mobs at 90 abd and 110 flexion; STM to upper trap, pec major/minor, lats, subscap, supra & infraspinatus ?Sidelying abduction 1lb ?Sidelying horiz abd 1lb ?Sidelying flexion 1lb ?Sidelying PT resisted protraction/retraction ?Sidelying abd to 90 1lb circles     *all sidelying exercises with PT assisted/tactile cues for scapular motion ?Pulleys flexion & scaption ?IR stretch with strap ? ?4/14 ? ?(8-12 week period ROM as tolerated, light strengthening) ? ?UBE Lvl 2 3 min fwd and retro ? ?Standing L arm GTB pec fly 2x10 with focus on eccentric ?Scap retraction with circles CW and CCW GTB 2x20 each ?Tall mat table height push ups 2x10 ?Flexion stretch pull off of sink ?Kneeling lat stretch with bar 5s 10x ? ?ER at side to 90 deg press up YTB at table 2x10 ?Self TB massage ant delt 56mn ? ?L GHJ Inf mobs grade IV in horizontal ABD ? ?4/10 ?Tindeq testing ?Low trap set off of wall, 1lb ?Flexion stretch pull off of sink ?ER at side to 90 deg press up 1lb ?Triceps kicks ?MANUAL: sidelying scap mobilization with scapular distraction, supine pec stretch and STM ? ? ? ?PATIENT EDUCATION: ?Education details: exercise form/rationale ?Person educated: Patient ?Education method: Explanation, Demonstration, Tactile cues, Verbal cues, and Handouts ?Education comprehension: verbalized understanding, returned demonstration, verbal cues required, tactile cues required, and needs further education ? ? ?HOME EXERCISE PROGRAM: ?Access Code: [email or web address]?URL:  [email or web address] ? ? ? ? ? ?ASSESSMENT: ? ?CLINICAL IMPRESSION: ?Tactile cues required for scap motion in sidelying as he tends to roll back to compensate. Range of motion is great and will continue to progress strength appropriately.  ? ? ?Objective impairments include decreased ROM, decreased strength, increased muscle spasms, impaired flexibility, impaired UE functional use, improper body mechanics, postural dysfunction, and pain. These impairments are limiting patient from cleaning, meal prep, occupation, yard work, and physical fitness . Personal factors including 1 comorbidity: h/o shoulder separation  are also affecting patient's functional outcome. Patient will benefit from skilled PT to address above impairments and improve overall function. ? ?REHAB POTENTIAL: Good ? ?CLINICAL DECISION MAKING: Stable/uncomplicated ? ?EVALUATION COMPLEXITY: Low ? ? ?GOALS: ?Goals reviewed with patient? Yes ? ?SHORT TERM GOALS: ? ?STG Name Target Date Goal status  ?1 Passive & AA flexion to 90 without increased pain ?Baseline: unable at eval 03/06/2022 MET  ?2 Demo proper activation of periscapular musculature while performing isometrics ?Baseline: will progress into these exercises as appropriate 03/06/2022 MET  ?3 Pt will verbalize compliance with & understanding of restrictions while performing ADLs ?Baseline:will educate as appropriate 03/06/2022 MET  ?     ?     ?     ?     ? ?LONG TERM GOALS:  ? ?LTG Name Target Date Goal status  ?1 Flexion to 160 with minimal to no discomfort ?Baseline: no motion at eval 04/03/2022 ongoing  ?2 Able to reach center belt loop behind back without compensation ?Baseline:unable at eval 04/03/2022 ongoing  ?3 Able to demo scapular control in lifting and maneuvering 5lb weight at and above shoulder height ?Baseline:unable at eval 05/01/2022 ongoing  ?4 Will begin plyometric and CKC exercises  ?Baseline: will progress as appropriate 05/01/2022 ongoing  ?5 PT and pt will  collaborate to create further objective goals  ?Baseline: to be set at later date 05/01/2022 ongoing  ?     ?     ? ?PLAN: ?PT FREQUENCY: 1-2x/week ? ?PT DURATION: 12 weeks ? ?PLANNED INTERVENTIONS: Therapeutic  exercises, Therapeutic activity, Neuro Muscular re-education, Patient/Family education, Joint mobilization, Aquatic Therapy, Dry Needling, Electrical stimulation, Cryotherapy, Moist heat, scar mobilization, Taping, Vasopneumatic device, and Manual therapy ? ?PLAN FOR NEXT SESSION(s): scap stabilit ? ?Hara Milholland C. Gustava Berland PT, DPT ?02/06/22 9:27 AM ? ? ? ? ? ? ?

## 2022-02-10 ENCOUNTER — Ambulatory Visit (INDEPENDENT_AMBULATORY_CARE_PROVIDER_SITE_OTHER): Payer: 59 | Admitting: Orthopaedic Surgery

## 2022-02-10 ENCOUNTER — Ambulatory Visit (HOSPITAL_BASED_OUTPATIENT_CLINIC_OR_DEPARTMENT_OTHER): Payer: 59 | Admitting: Physical Therapy

## 2022-02-10 ENCOUNTER — Encounter (HOSPITAL_BASED_OUTPATIENT_CLINIC_OR_DEPARTMENT_OTHER): Payer: Self-pay | Admitting: Physical Therapy

## 2022-02-10 DIAGNOSIS — M25512 Pain in left shoulder: Secondary | ICD-10-CM | POA: Diagnosis not present

## 2022-02-10 DIAGNOSIS — M25612 Stiffness of left shoulder, not elsewhere classified: Secondary | ICD-10-CM

## 2022-02-10 DIAGNOSIS — M24112 Other articular cartilage disorders, left shoulder: Secondary | ICD-10-CM

## 2022-02-10 NOTE — Progress Notes (Signed)
? ?                            ? ? ?Post Operative Evaluation ?  ? ?Procedure/Date of Surgery: Left shoulder labral repair 11/15/21 ? ?Interval History:  ? ?Today 3 months status post the above procedure.  Overall he is doing very well.  He is essentially equalized range of motion on the side compared to the other side.  He is working on Hotel manager.  He has some soreness after going to physical therapy but otherwise essentially no pain. ? ?PMH/PSH/Family History/Social History/Meds/Allergies:   ? ?Past Medical History:  ?Diagnosis Date  ? Arthritis   ? bilateral shoulders  ? Chicken pox   ? Frequent headaches   ? GERD (gastroesophageal reflux disease)   ? Hay fever   ? ?Past Surgical History:  ?Procedure Laterality Date  ? SHOULDER ARTHROSCOPY WITH LABRAL REPAIR Left 11/15/2021  ? Procedure: left SHOULDER ARTHROSCOPY WITH LABRAL REPAIR;  Surgeon: Vanetta Mulders, MD;  Location: [facility];  Service: Orthopedics;  Laterality: Left;  ? WISDOM TOOTH EXTRACTION    ? ?Social History  ? ?Socioeconomic History  ? Marital status: Married  ?  Spouse name: Not on file  ? Number of children: Not on file  ? Years of education: Not on file  ? Highest education level: Bachelor's degree (e.g., BA, AB, BS)  ?Occupational History  ? Not on file  ?Tobacco Use  ? Smoking status: Never  ? Smokeless tobacco: Never  ?Vaping Use  ? Vaping Use: Never used  ?Substance and Sexual Activity  ? Alcohol use: Never  ?  Alcohol/week: 0.0 standard drinks  ? Drug use: Never  ? Sexual activity: Not on file  ?Other Topics Concern  ? Not on file  ?Social History Narrative  ? Not on file  ? ?Social Determinants of Health  ? ?Financial Resource Strain: Low Risk   ? Difficulty of Paying Living Expenses: Not hard at all  ?Food Insecurity: No Food Insecurity  ? Worried About Charity fundraiser in the Last Year: Never true  ? Ran Out of Food in the Last Year: Never true  ?Transportation Needs: No Transportation Needs  ? Lack of Transportation (Medical): No  ?  Lack of Transportation (Non-Medical): No  ?Physical Activity: Insufficiently Active  ? Days of Exercise per Week: 2 days  ? Minutes of Exercise per Session: 30 min  ?Stress: No Stress Concern Present  ? Feeling of Stress : Only a little  ?Social Connections: Unknown  ? Frequency of Communication with Friends and Family: Patient refused  ? Frequency of Social Gatherings with Friends and Family: Patient refused  ? Attends Religious Services: Patient refused  ? Active Member of Clubs or Organizations: No  ? Attends Archivist Meetings: Not on file  ? Marital Status: Married  ? ?Family History  ?Problem Relation Age of Onset  ? Diabetes Mother   ? Cancer Father   ? Heart disease Paternal Grandmother   ? ?No Known Allergies ?Current Outpatient Medications  ?Medication Sig Dispense Refill  ? aspirin EC 325 MG tablet Take 1 tablet (325 mg total) by mouth daily. 30 tablet 0  ? cholecalciferol (VITAMIN D3) 25 MCG (1000 UNIT) tablet Take 2,000 Units by mouth daily.    ? loratadine (CLARITIN) 10 MG tablet Take 10 mg by mouth daily.    ? oxycodone (OXY-IR) 5 MG capsule Take 1 capsule (5 mg total) by mouth  every 4 (four) hours as needed (severe pain). 20 capsule 0  ? Pseudoephedrine HCl (SUDAFED SINUS CONGESTION 24HR) 240 MG TB24 Take 240 mg by mouth daily as needed (congestion).    ? ?No current facility-administered medications for this visit.  ? ?No results found. ? ?Review of Systems:   ?A ROS was performed including pertinent positives and negatives as documented in the HPI. ? ? ?Musculoskeletal Exam:   ? ?There were no vitals taken for this visit. ? ?Left shoulder arthroscopic incisions are well-healed.  Active forward elevation of the left arm is to 160 equal to contralateral side.  Active external rotation at the side is to 60 compared to 60 on the contralateral side.  Internal rotation is to T12 bilaterally.  2+ radial pulse ? ?Imaging:   ? ?None ? ?I personally reviewed and interpreted the  radiographs. ? ? ?Assessment:   ?46 year old male 12 weeks status post left shoulder labral repair overall he is doing extremely well.  At this time I would like him to continue strengthening and endurance.  At this time I have not placed any restrictions on him.  He may continue to be active as tolerated.  I will see him back in 12 weeks.  At that time we will discuss his right side as well. ?Plan :   ? ?-Return to clinic 3 months ? ? ? ? ? ?I personally saw and evaluated the patient, and participated in the management and treatment plan. ? ?Vanetta Mulders, MD ?Attending Physician, Orthopedic Surgery ? ?This document was dictated using Systems analyst. A reasonable attempt at proof reading has been made to minimize errors. ?

## 2022-02-10 NOTE — Therapy (Signed)
?OUTPATIENT PHYSICAL TREATMENT NOTE ? ? ?Patient Name: Gary Hayes ?MRN: [id number] ?DOB:10/01/1976, 46 y.o., male ?Today's Date: 02/10/2022 ? ? PT End of Session - 02/10/22 0905   ? ? Visit Number 21   ? Number of Visits 33   ? Date for PT Re-Evaluation 03/10/22   ? Authorization Type UHC   ? PT Start Time 0845   ? PT Stop Time 0925   ? PT Time Calculation (min) 40 min   ? Activity Tolerance Patient tolerated treatment well   ? Behavior During Therapy [facility] for tasks assessed/performed   ? ?  ?  ? ?  ? ? ? ? ? ? ? ? ? ? ? ? ? ? ? ? ?Past Medical History:  ?Diagnosis Date  ? Arthritis   ? bilateral shoulders  ? Chicken pox   ? Frequent headaches   ? GERD (gastroesophageal reflux disease)   ? Hay fever   ? ?Past Surgical History:  ?Procedure Laterality Date  ? SHOULDER ARTHROSCOPY WITH LABRAL REPAIR Left 11/15/2021  ? Procedure: left SHOULDER ARTHROSCOPY WITH LABRAL REPAIR;  Surgeon: Vanetta Mulders, MD;  Location: [facility];  Service: Orthopedics;  Laterality: Left;  ? WISDOM TOOTH EXTRACTION    ? ?Patient Active Problem List  ? Diagnosis Date Noted  ? Labral tear of shoulder, degenerative, left   ? Plantar fasciitis 12/20/2018  ? Tightness of right gastrocnemius muscle 12/20/2018  ? Shoulder separation 12/20/2018  ? ? ?PCP: Billie Ruddy, MD ? ?REFERRING PROVIDER: Vanetta Mulders, MD ? ?REFERRING DIAG: s/p Labral repair ? ?THERAPY DIAG:  ?Acute pain of left shoulder ? ?Stiffness of left shoulder, not elsewhere classified ? ? ?ONSET DATE: 11/15/21-surgery ? ?SUBJECTIVE:                                                                                                                                                                                     ? ?SUBJECTIVE STATEMENT: ?Pt states he has been icing more and that has helped. He still has a hitch at 56 ABD but it goes away and it feels okay going up. It is still moving well.  ? ? ?PERTINENT HISTORY: ?none ? ?PAIN:  ?Are you having pain? No  ?NPRS scale: 0/10 ?Pain  location: Lt shoulder ?PAIN TYPE: aching  ?Aggravating factors: being up and around ?Relieving factors: ice, meds ? ?PRECAUTIONS: Shoulder ? ?WEIGHT BEARING RESTRICTIONS Yes labral repair ? ? 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 ? ?PATIENT GOALS exercise, sports- basketball, running, baseball ? ?OBJECTIVE:  ?PATIENT SURVEYS:  ?FOTO 30 ? ?GHJ Flexion AROM 140 ?Tindeq testing 4/10: ? Right (lb) Left (lb)  ?IR 23.8 10.4  ?ER  18.1 11  ?Pull 21.2 19  ?push 27.8 22.7  ?biceps 32.4 16.8  ?Straight arm shoulder flexion 19.2 9 ?  ? ? ? ?  ?TODAY'S TREATMENT:  ? ?4/21 ? ?L GHJ post/inf joint mobs at 90 abd and 110 flexion; grade IV ?MWM ABD grade IV ?Kneeling lat stretch with bar 5s 10x ?Neutral OHP 2lb 2x10 ?90/90 ER/IR YTB 2x10 ?Bar height push up 3x10- focus on scapular set position ?Seated cable row 25lbs 3x10 ? ? ? ?4/17: ?UBE 2 min retro ?MANUAL: post/inf joint mobs at 90 abd and 110 flexion; STM to upper trap, pec major/minor, lats, subscap, supra & infraspinatus ?Sidelying abduction 1lb ?Sidelying horiz abd 1lb ?Sidelying flexion 1lb ?Sidelying PT resisted protraction/retraction ?Sidelying abd to 90 1lb circles     *all sidelying exercises with PT assisted/tactile cues for scapular motion ?Pulleys flexion & scaption ?IR stretch with strap ? ?4/14 ? ?(8-12 week period ROM as tolerated, light strengthening) ? ?UBE Lvl 2 3 min fwd and retro ? ?Standing L arm GTB pec fly 2x10 with focus on eccentric ?Scap retraction with circles CW and CCW GTB 2x20 each ?Tall mat table height push ups 2x10 ?Flexion stretch pull off of sink ?Kneeling lat stretch with bar 5s 10x ? ?ER at side to 90 deg press up YTB at table 2x10 ?Self TB massage ant delt 75mn ? ?L GHJ Inf mobs grade IV in horizontal ABD ? ?4/10 ?Tindeq testing ?Low trap set off of wall, 1lb ?Flexion stretch pull off of sink ?ER at side to 90 deg press up 1lb ?Triceps kicks ?MANUAL: sidelying scap mobilization with scapular distraction, supine pec stretch and  STM ? ? ? ?PATIENT EDUCATION: ?Education details: exercise form/rationale ?Person educated: Patient ?Education method: Explanation, Demonstration, Tactile cues, Verbal cues, and Handouts ?Education comprehension: verbalized understanding, returned demonstration, verbal cues required, tactile cues required, and needs further education ? ? ?HOME EXERCISE PROGRAM: ?Access Code: [email or web address]?URL: [email or web address] ? ? ? ? ? ?ASSESSMENT: ? ?CLINICAL IMPRESSION: ?Pt with significantly improved ABD ROM and feeling of tightness following manual therapy, especially mob with movement. Pt was able to progress strengthening of the L shoulder in 90/90 and CKC today without pain. Weakness and tetany with sustained reps as expected. Plan to continue with L shoulder strengthening and end range stretching as tolerated. Pt is 12+ wks at this time.  ? ?Objective impairments include decreased ROM, decreased strength, increased muscle spasms, impaired flexibility, impaired UE functional use, improper body mechanics, postural dysfunction, and pain. These impairments are limiting patient from cleaning, meal prep, occupation, yard work, and physical fitness . Personal factors including 1 comorbidity: h/o shoulder separation  are also affecting patient's functional outcome. Patient will benefit from skilled PT to address above impairments and improve overall function. ? ?REHAB POTENTIAL: Good ? ?CLINICAL DECISION MAKING: Stable/uncomplicated ? ?EVALUATION COMPLEXITY: Low ? ? ?GOALS: ?Goals reviewed with patient? Yes ? ?SHORT TERM GOALS: ? ?STG Name Target Date Goal status  ?1 Passive & AA flexion to 90 without increased pain ?Baseline: unable at eval 03/10/2022 MET  ?2 Demo proper activation of periscapular musculature while performing isometrics ?Baseline: will progress into these exercises as appropriate 03/10/2022 MET  ?3 Pt will verbalize compliance with & understanding of restrictions while performing ADLs ?Baseline:will  educate as appropriate 03/10/2022 MET  ?     ?     ?     ?     ? ?LONG TERM GOALS:  ? ?LTG Name Target Date Goal status  ?1 Flexion  to 160 with minimal to no discomfort ?Baseline: no motion at eval 04/07/2022 ongoing  ?2 Able to reach center belt loop behind back without compensation ?Baseline:unable at eval 04/07/2022 ongoing  ?3 Able to demo scapular control in lifting and maneuvering 5lb weight at and above shoulder height ?Baseline:unable at eval 05/05/2022 ongoing  ?4 Will begin plyometric and CKC exercises  ?Baseline: will progress as appropriate 05/05/2022 ongoing  ?5 PT and pt will collaborate to create further objective goals  ?Baseline: to be set at later date 05/05/2022 ongoing  ?     ?     ? ?PLAN: ?PT FREQUENCY: 1-2x/week ? ?PT DURATION: 12 weeks ? ?PLANNED INTERVENTIONS: Therapeutic exercises, Therapeutic activity, Neuro Muscular re-education, Patient/Family education, Joint mobilization, Aquatic Therapy, Dry Needling, Electrical stimulation, Cryotherapy, Moist heat, scar mobilization, Taping, Vasopneumatic device, and Manual therapy ? ?PLAN FOR NEXT SESSION(s): scap stabilit ? ?Daleen Bo PT, DPT ?02/10/22 9:35 AM ? ? ? ? ? ? ? ?

## 2022-02-13 ENCOUNTER — Encounter (HOSPITAL_BASED_OUTPATIENT_CLINIC_OR_DEPARTMENT_OTHER): Payer: Self-pay | Admitting: Physical Therapy

## 2022-02-13 ENCOUNTER — Ambulatory Visit (HOSPITAL_BASED_OUTPATIENT_CLINIC_OR_DEPARTMENT_OTHER): Payer: 59 | Admitting: Physical Therapy

## 2022-02-13 DIAGNOSIS — M25612 Stiffness of left shoulder, not elsewhere classified: Secondary | ICD-10-CM

## 2022-02-13 DIAGNOSIS — M25512 Pain in left shoulder: Secondary | ICD-10-CM

## 2022-02-13 NOTE — Therapy (Signed)
?OUTPATIENT PHYSICAL TREATMENT NOTE ? ? ?Patient Name: Gary Hayes ?MRN: [id number] ?DOB:[birth year], 46 y.o., male ?Today's Date: 02/13/2022 ? ? PT End of Session - 02/13/22 0857   ? ? Visit Number 22   ? Number of Visits 33   ? Date for PT Re-Evaluation 03/10/22   ? Authorization Type UHC   ? PT Start Time 0845   ? PT Stop Time 0925   ? PT Time Calculation (min) 40 min   ? Activity Tolerance Patient tolerated treatment well   ? Behavior During Therapy [facility] for tasks assessed/performed   ? ?  ?  ? ?  ? ? ? ? ? ? ? ? ? ? ? ? ? ? ? ? ? ?Past Medical History:  ?Diagnosis Date  ? Arthritis   ? bilateral shoulders  ? Chicken pox   ? Frequent headaches   ? GERD (gastroesophageal reflux disease)   ? Hay fever   ? ?Past Surgical History:  ?Procedure Laterality Date  ? SHOULDER ARTHROSCOPY WITH LABRAL REPAIR Left 11/15/2021  ? Procedure: left SHOULDER ARTHROSCOPY WITH LABRAL REPAIR;  Surgeon: Vanetta Mulders, MD;  Location: [facility];  Service: Orthopedics;  Laterality: Left;  ? WISDOM TOOTH EXTRACTION    ? ?Patient Active Problem List  ? Diagnosis Date Noted  ? Labral tear of shoulder, degenerative, left   ? Plantar fasciitis 12/20/2018  ? Tightness of right gastrocnemius muscle 12/20/2018  ? Shoulder separation 12/20/2018  ? ? ?PCP: Billie Ruddy, MD ? ?REFERRING PROVIDER: Vanetta Mulders, MD ? ?REFERRING DIAG: s/p Labral repair ? ?THERAPY DIAG:  ?Acute pain of left shoulder ? ?Stiffness of left shoulder, not elsewhere classified ? ? ?ONSET DATE: 11/15/21-surgery ? ?SUBJECTIVE:                                                                                                                                                                                     ? ?SUBJECTIVE STATEMENT: ?Pt states the shoulder is moving well. He was able to play catch with his son and it actually loosened up the arm. The reaching and "catch" is better after he stretches it but will still tighten during the day.  ? ? ?PERTINENT HISTORY: ?none ? ?PAIN:   ?Are you having pain? No  ?NPRS scale: 0/10 ?Pain location: Lt shoulder ?PAIN TYPE: aching  ?Aggravating factors: being up and around ?Relieving factors: ice, meds ? ?PRECAUTIONS: Shoulder ? ?WEIGHT BEARING RESTRICTIONS Yes labral repair ? ? 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 ? ?PATIENT GOALS exercise, sports- basketball, running, baseball ? ?OBJECTIVE:  ?PATIENT SURVEYS:  ?FOTO 30 ? ?GHJ Flexion AROM 140 ?Tindeq testing 4/10: ? Right (lb) Left (  lb)  ?IR 23.8 10.4  ?ER 18.1 11  ?Pull 21.2 19  ?push 27.8 22.7  ?biceps 32.4 16.8  ?Straight arm shoulder flexion 19.2 9 ?  ? ? ? ?  ?TODAY'S TREATMENT:  ? ?4/24 ? ? ?UBE L2.5 min retro and fwd ? ?Kneeling lat stretch with bar 5s 10x ?Wall pec stretch 90/90 30s 3x ? ?[employer] 2lb 3x10 ?Body blade 20s 3x  at 0, elbow at 90 ?90/90 ER/IR YTB 2x10 ?Kneeling pushup 2x10 ?Kneeling lat stretch with T/S rotation 5x each side ?Seated cable row 25lbs 2x10 ?Cable biceps/triceps 2x10 15lbs  ? ?4/21 ? ?L GHJ post/inf joint mobs at 90 abd and 110 flexion; grade IV ?MWM ABD grade IV ?Kneeling lat stretch with bar 5s 10x ?Neutral OHP 2lb 2x10 ?90/90 ER/IR YTB 2x10 ?Bar height push up 3x10- focus on scapular set position ?Seated cable row 25lbs 3x10 ? ? ? ?4/17: ?UBE 2 min retro ?MANUAL: post/inf joint mobs at 90 abd and 110 flexion; STM to upper trap, pec major/minor, lats, subscap, supra & infraspinatus ?Sidelying abduction 1lb ?Sidelying horiz abd 1lb ?Sidelying flexion 1lb ?Sidelying PT resisted protraction/retraction ?Sidelying abd to 90 1lb circles     *all sidelying exercises with PT assisted/tactile cues for scapular motion ?Pulleys flexion & scaption ?IR stretch with strap ? ?4/14 ? ?(8-12 week period ROM as tolerated, light strengthening) ? ?UBE Lvl 2 3 min fwd and retro ? ?Standing L arm GTB pec fly 2x10 with focus on eccentric ?Scap retraction with circles CW and CCW GTB 2x20 each ?Tall mat table height push ups 2x10 ?Flexion stretch pull off of sink ?Kneeling  lat stretch with bar 5s 10x ? ?ER at side to 90 deg press up YTB at table 2x10 ?Self TB massage ant delt 1mn ? ?L GHJ Inf mobs grade IV in horizontal ABD ? ?4/10 ?Tindeq testing ?Low trap set off of wall, 1lb ?Flexion stretch pull off of sink ?ER at side to 90 deg press up 1lb ?Triceps kicks ?MANUAL: sidelying scap mobilization with scapular distraction, supine pec stretch and STM ? ? ? ?PATIENT EDUCATION: ?Education details: exercise form/rationale ?Person educated: Patient ?Education method: Explanation, Demonstration, Tactile cues, Verbal cues, and Handouts ?Education comprehension: verbalized understanding, returned demonstration, verbal cues required, tactile cues required, and needs further education ? ? ?HOME EXERCISE PROGRAM: ?Access Code: [email or web address]?URL: [email or web address] ? ? ? ? ? ?ASSESSMENT: ? ?CLINICAL IMPRESSION: ?Pt able to continue with progression of L UE strength training at this time. Pt with good OH progression as well as progression of CKC scapular stability. Pt able to reduce frequency to 1x/week at this time as he is becoming more independent with exercise and self strengthening. Plan to continue with OH strength Pt is 12+ wks at this time.  ? ?Objective impairments include decreased ROM, decreased strength, increased muscle spasms, impaired flexibility, impaired UE functional use, improper body mechanics, postural dysfunction, and pain. These impairments are limiting patient from cleaning, meal prep, occupation, yard work, and physical fitness . Personal factors including 1 comorbidity: h/o shoulder separation  are also affecting patient's functional outcome. Patient will benefit from skilled PT to address above impairments and improve overall function. ? ?REHAB POTENTIAL: Good ? ?CLINICAL DECISION MAKING: Stable/uncomplicated ? ?EVALUATION COMPLEXITY: Low ? ? ?GOALS: ?Goals reviewed with patient? Yes ? ?SHORT TERM GOALS: ? ?STG Name Target Date Goal status  ?1 Passive & AA  flexion to 90 without increased pain ?Baseline: unable at eval 03/13/2022 MET  ?2 Demo proper activation of  periscapular musculature while performing isometrics ?Baseline: will progress into these exercises as appropriate 03/13/2022 MET  ?3 Pt will verbalize compliance with & understanding of restrictions while performing ADLs ?Baseline:will educate as appropriate 03/13/2022 MET  ?     ?     ?     ?     ? ?LONG TERM GOALS:  ? ?LTG Name Target Date Goal status  ?1 Flexion to 160 with minimal to no discomfort ?Baseline: no motion at eval 04/10/2022 ongoing  ?2 Able to reach center belt loop behind back without compensation ?Baseline:unable at eval 04/10/2022 ongoing  ?3 Able to demo scapular control in lifting and maneuvering 5lb weight at and above shoulder height ?Baseline:unable at eval 05/08/2022 ongoing  ?4 Will begin plyometric and CKC exercises  ?Baseline: will progress as appropriate 05/08/2022 ongoing  ?5 PT and pt will collaborate to create further objective goals  ?Baseline: to be set at later date 05/08/2022 ongoing  ?     ?     ? ?PLAN: ?PT FREQUENCY: 1-2x/week ? ?PT DURATION: 12 weeks ? ?PLANNED INTERVENTIONS: Therapeutic exercises, Therapeutic activity, Neuro Muscular re-education, Patient/Family education, Joint mobilization, Aquatic Therapy, Dry Needling, Electrical stimulation, Cryotherapy, Moist heat, scar mobilization, Taping, Vasopneumatic device, and Manual therapy ? ?PLAN FOR NEXT SESSION(s): scap stability ? ?Daleen Bo PT, DPT ?02/13/22 9:27 AM ? ? ? ? ? ? ? ?

## 2022-02-17 ENCOUNTER — Ambulatory Visit (HOSPITAL_BASED_OUTPATIENT_CLINIC_OR_DEPARTMENT_OTHER): Payer: 59 | Admitting: Physical Therapy

## 2022-02-20 ENCOUNTER — Encounter (HOSPITAL_BASED_OUTPATIENT_CLINIC_OR_DEPARTMENT_OTHER): Payer: Self-pay | Admitting: Physical Therapy

## 2022-02-20 ENCOUNTER — Ambulatory Visit (HOSPITAL_BASED_OUTPATIENT_CLINIC_OR_DEPARTMENT_OTHER): Payer: 59 | Attending: Orthopaedic Surgery | Admitting: Physical Therapy

## 2022-02-20 DIAGNOSIS — M25512 Pain in left shoulder: Secondary | ICD-10-CM | POA: Diagnosis present

## 2022-02-20 DIAGNOSIS — M25612 Stiffness of left shoulder, not elsewhere classified: Secondary | ICD-10-CM | POA: Insufficient documentation

## 2022-02-20 NOTE — Therapy (Signed)
?OUTPATIENT PHYSICAL TREATMENT NOTE ? ? ?Patient Name: Gary Hayes ?MRN: [id number] ?DOB:[birth year], 46 y.o., male ?Today's Date: 02/20/2022 ? ? PT End of Session - 02/20/22 0848   ? ? Visit Number 23   ? Number of Visits 33   ? Date for PT Re-Evaluation 03/10/22   ? Authorization Type UHC   ? PT Start Time 0845   ? PT Stop Time 0925   ? PT Time Calculation (min) 40 min   ? Activity Tolerance Patient tolerated treatment well   ? Behavior During Therapy [facility] for tasks assessed/performed   ? ?  ?  ? ?  ? ? ? ? ? ? ? ? ? ? ? ? ? ? ? ? ? ? ?Past Medical History:  ?Diagnosis Date  ? Arthritis   ? bilateral shoulders  ? Chicken pox   ? Frequent headaches   ? GERD (gastroesophageal reflux disease)   ? Hay fever   ? ?Past Surgical History:  ?Procedure Laterality Date  ? SHOULDER ARTHROSCOPY WITH LABRAL REPAIR Left 11/15/2021  ? Procedure: left SHOULDER ARTHROSCOPY WITH LABRAL REPAIR;  Surgeon: Vanetta Mulders, MD;  Location: [place];  Service: Orthopedics;  Laterality: Left;  ? WISDOM TOOTH EXTRACTION    ? ?Patient Active Problem List  ? Diagnosis Date Noted  ? Labral tear of shoulder, degenerative, left   ? Plantar fasciitis 12/20/2018  ? Tightness of right gastrocnemius muscle 12/20/2018  ? Shoulder separation 12/20/2018  ? ? ?PCP: Billie Ruddy, MD ? ?REFERRING PROVIDER: Vanetta Mulders, MD ? ?REFERRING DIAG: s/p Labral repair ? ?THERAPY DIAG:  ?Stiffness of left shoulder, not elsewhere classified ? ?Acute pain of left shoulder ? ? ?ONSET DATE: 11/15/21-surgery ? ?SUBJECTIVE:                                                                                                                                                                                     ? ?SUBJECTIVE STATEMENT: ?Pt reports the [place] is feeling stronger. "It feels good to workout with that shoulder."  ? ? ?PERTINENT HISTORY: ?none ? ?PAIN:  ?Are you having pain? No  ?NPRS scale: 0/10 ?Pain location: Lt shoulder ?PAIN TYPE: aching  ?Aggravating factors:  being up and around ?Relieving factors: ice, meds ? ?PRECAUTIONS: Shoulder ? ?WEIGHT BEARING RESTRICTIONS Yes labral repair ? ? 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 ? ?PATIENT GOALS exercise, sports- basketball, running, baseball ? ?OBJECTIVE:  ?PATIENT SURVEYS:  ?FOTO 30 ? ?GHJ Flexion AROM 140 ?Tindeq testing 4/10: ? Right (lb) Left (lb)  ?IR 23.8 10.4  ?ER 18.1 11  ?Pull 21.2 19  ?push 27.8 22.7  ?biceps 32.4 16.8  ?Straight arm  shoulder flexion 19.2 9 ?  ? ? ? ?  ?TODAY'S TREATMENT:  ? ?5/1 ? ? ?UBE L2.5 min retro and fwd 2 min ? ?Wall pec stretch 90/90 30s 2x ? ?[employer] 5lb 3x8 ?Body blade 20s 3x  at 0, elbow at 90 ?BOSU plank 20s 4x ?Kneeling plank on elbows in and out- with Green Swissball 3x10 ?TRX BW row 3x10 ?Scap pull down 25lbs 10x ?Lat pull down 40lbs 4x8 ?Wall IR stretch 10s 5x  ? ?4/24 ? ? ?UBE L2.5 min retro and fwd ? ?Kneeling lat stretch with bar 5s 10x ?Wall pec stretch 90/90 30s 3x ? ?[employer] 2lb 3x10 ?Body blade 20s 3x  at 0, elbow at 90 ?90/90 ER/IR YTB 2x10 ?Kneeling pushup 2x10 ?Kneeling lat stretch with T/S rotation 5x each side ?Seated cable row 25lbs 2x10 ?Cable biceps/triceps 2x10 15lbs  ? ?4/21 ? ?L GHJ post/inf joint mobs at 90 abd and 110 flexion; grade IV ?MWM ABD grade IV ?Kneeling lat stretch with bar 5s 10x ?Neutral OHP 2lb 2x10 ?90/90 ER/IR YTB 2x10 ?Bar height push up 3x10- focus on scapular set position ?Seated cable row 25lbs 3x10 ? ? ? ?4/17: ?UBE 2 min retro ?MANUAL: post/inf joint mobs at 90 abd and 110 flexion; STM to upper trap, pec major/minor, lats, subscap, supra & infraspinatus ?Sidelying abduction 1lb ?Sidelying horiz abd 1lb ?Sidelying flexion 1lb ?Sidelying PT resisted protraction/retraction ?Sidelying abd to 90 1lb circles     *all sidelying exercises with PT assisted/tactile cues for scapular motion ?Pulleys flexion & scaption ?IR stretch with strap ? ?4/14 ? ?(8-12 week period ROM as tolerated, light strengthening) ? ?UBE Lvl 2 3 min fwd and  retro ? ?Standing L arm GTB pec fly 2x10 with focus on eccentric ?Scap retraction with circles CW and CCW GTB 2x20 each ?Tall mat table height push ups 2x10 ?Flexion stretch pull off of sink ?Kneeling lat stretch with bar 5s 10x ? ?ER at side to 90 deg press up YTB at table 2x10 ?Self TB massage ant delt 16mn ? ?L GHJ Inf mobs grade IV in horizontal ABD ? ?4/10 ?Tindeq testing ?Low trap set off of wall, 1lb ?Flexion stretch pull off of sink ?ER at side to 90 deg press up 1lb ?Triceps kicks ?MANUAL: sidelying scap mobilization with scapular distraction, supine pec stretch and STM ? ? ? ?PATIENT EDUCATION: ?Education details: exercise form/rationale ?Person educated: Patient ?Education method: Explanation, Demonstration, Tactile cues, Verbal cues, and Handouts ?Education comprehension: verbalized understanding, returned demonstration, verbal cues required, tactile cues required, and needs further education ? ? ?HOME EXERCISE PROGRAM: ?Access Code: [email or web address]?URL: [email or web address] ? ? ? ? ? ?ASSESSMENT: ? ?CLINICAL IMPRESSION: ?Pt able to increase intensity of loading at the shoulder as well as increasing degrees of freedom. Pt cleared to started doing light gym based/ strengthening exercise. Pt with good tolerance to CKC exercise but noticeable scapular stability fatigue with sustained repetitions, especially with resisted OH setting motion.  Plan to continue with scapular stability as well as eccentric cuff strength for progression towards return to sporting activity with his children. ? ?Objective impairments include decreased ROM, decreased strength, increased muscle spasms, impaired flexibility, impaired UE functional use, improper body mechanics, postural dysfunction, and pain. These impairments are limiting patient from cleaning, meal prep, occupation, yard work, and physical fitness . Personal factors including 1 comorbidity: h/o shoulder separation  are also affecting patient's functional  outcome. Patient will benefit from skilled PT to address above impairments and improve overall function. ? ?  REHAB POTENTIAL: Good ? ?CLINICAL DECISION MAKING: Stable/uncomplicated ? ?EVALUATION COMPLEXITY: Low ? ? ?GOALS: ?Goals reviewed with patient? Yes ? ?SHORT TERM GOALS: ? ?STG Name Target Date Goal status  ?1 Passive & AA flexion to 90 without increased pain ?Baseline: unable at eval 03/20/2022 MET  ?2 Demo proper activation of periscapular musculature while performing isometrics ?Baseline: will progress into these exercises as appropriate 03/20/2022 MET  ?3 Pt will verbalize compliance with & understanding of restrictions while performing ADLs ?Baseline:will educate as appropriate 03/20/2022 MET  ?     ?     ?     ?     ? ?LONG TERM GOALS:  ? ?LTG Name Target Date Goal status  ?1 Flexion to 160 with minimal to no discomfort ?Baseline: no motion at eval 04/17/2022 ongoing  ?2 Able to reach center belt loop behind back without compensation ?Baseline:unable at eval 04/17/2022 ongoing  ?3 Able to demo scapular control in lifting and maneuvering 5lb weight at and above shoulder height ?Baseline:unable at eval 05/15/2022 ongoing  ?4 Will begin plyometric and CKC exercises  ?Baseline: will progress as appropriate 05/15/2022 ongoing  ?5 PT and pt will collaborate to create further objective goals  ?Baseline: to be set at later date 05/15/2022 ongoing  ?     ?     ? ?PLAN: ?PT FREQUENCY: 1-2x/week ? ?PT DURATION: 12 weeks ? ?PLANNED INTERVENTIONS: Therapeutic exercises, Therapeutic activity, Neuro Muscular re-education, Patient/Family education, Joint mobilization, Aquatic Therapy, Dry Needling, Electrical stimulation, Cryotherapy, Moist heat, scar mobilization, Taping, Vasopneumatic device, and Manual therapy ? ?PLAN FOR NEXT SESSION(s): scap stability ? ?Daleen Bo PT, DPT ?02/20/22 9:28 AM ? ? ? ? ? ? ? ?

## 2022-02-24 ENCOUNTER — Encounter (HOSPITAL_BASED_OUTPATIENT_CLINIC_OR_DEPARTMENT_OTHER): Payer: 59 | Admitting: Physical Therapy

## 2022-02-27 ENCOUNTER — Encounter (HOSPITAL_BASED_OUTPATIENT_CLINIC_OR_DEPARTMENT_OTHER): Payer: Self-pay | Admitting: Physical Therapy

## 2022-02-27 ENCOUNTER — Ambulatory Visit (HOSPITAL_BASED_OUTPATIENT_CLINIC_OR_DEPARTMENT_OTHER): Payer: 59 | Admitting: Physical Therapy

## 2022-02-27 DIAGNOSIS — M25612 Stiffness of left shoulder, not elsewhere classified: Secondary | ICD-10-CM

## 2022-02-27 DIAGNOSIS — M25512 Pain in left shoulder: Secondary | ICD-10-CM

## 2022-02-27 NOTE — Therapy (Signed)
?OUTPATIENT PHYSICAL TREATMENT NOTE ? ? ?Patient Name: Gary Hayes ?MRN: [id number] ?DOB:04-21-1976, 46 y.o., male ?Today's Date: 02/27/2022 ? ? PT End of Session - 02/27/22 0850   ? ? Visit Number 24   ? Number of Visits 33   ? Date for PT Re-Evaluation 03/10/22   ? Authorization Type UHC   ? PT Start Time [phone]   ? PT Stop Time 0926   ? PT Time Calculation (min) 40 min   ? Activity Tolerance Patient tolerated treatment well   ? Behavior During Therapy [facility] for tasks assessed/performed   ? ?  ?  ? ?  ? ? ? ? ? ? ? ? ? ? ? ? ? ? ? ? ? ? ?Past Medical History:  ?Diagnosis Date  ? Arthritis   ? bilateral shoulders  ? Chicken pox   ? Frequent headaches   ? GERD (gastroesophageal reflux disease)   ? Hay fever   ? ?Past Surgical History:  ?Procedure Laterality Date  ? SHOULDER ARTHROSCOPY WITH LABRAL REPAIR Left 11/15/2021  ? Procedure: left SHOULDER ARTHROSCOPY WITH LABRAL REPAIR;  Surgeon: Vanetta Mulders, MD;  Location: [place];  Service: Orthopedics;  Laterality: Left;  ? WISDOM TOOTH EXTRACTION    ? ?Patient Active Problem List  ? Diagnosis Date Noted  ? Labral tear of shoulder, degenerative, left   ? Plantar fasciitis 12/20/2018  ? Tightness of right gastrocnemius muscle 12/20/2018  ? Shoulder separation 12/20/2018  ? ? ?PCP: Billie Ruddy, MD ? ?REFERRING PROVIDER: Vanetta Mulders, MD ? ?REFERRING DIAG: s/p Labral repair ? ?THERAPY DIAG:  ?Stiffness of left shoulder, not elsewhere classified ? ?Acute pain of left shoulder ? ? ?ONSET DATE: 11/15/21-surgery ? ?SUBJECTIVE:                                                                                                                                                                                     ? ?SUBJECTIVE STATEMENT: ?Pt reports the shoulder feels strong but is still a little stiff at the end range. ? ? ?PERTINENT HISTORY: ?none ? ?PAIN:  ?Are you having pain? No  ?NPRS scale: 0/10 ?Pain location: Lt shoulder ?PAIN TYPE: aching  ?Aggravating factors: being up and  around ?Relieving factors: ice, meds ? ?PRECAUTIONS: Shoulder ? ?WEIGHT BEARING RESTRICTIONS Yes labral repair ? ? 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 ? ?PATIENT GOALS exercise, sports- basketball, running, baseball ? ?OBJECTIVE:  ?PATIENT SURVEYS:  ?FOTO 30 ? ?GHJ Flexion AROM 140 ?Tindeq testing 4/10: ? Right (lb) Left (lb)  ?IR 23.8 10.4  ?ER 18.1 11  ?Pull 21.2 19  ?push 27.8 22.7  ?biceps 32.4 16.8  ?Straight arm  shoulder flexion 19.2 9 ?  ? ? ? ?  ?TODAY'S TREATMENT:  ? ?5/8 ? ?L GHJ post/inf joint mobs at end range; grade IV ?UBE L3 min retro and fwd 3 min ? ?Underhand/chin up pull down 25lbs 8x, 40lbs 10x, 55 lbs 2x10 ?Beardog arm raises 3x8 ?8lb OH carry walk 37ft x2 ?TRX row double arm 3x10 ?Self shoulder inf and post mob with mini band at rack ? ?5/1 ? ? ?UBE L2.5 min retro and fwd 2 min ? ?Wall pec stretch 90/90 30s 2x ? ?[employer] 5lb 3x8 ?Body blade 20s 3x  at 0, elbow at 90 ?BOSU plank 20s 4x ?Kneeling plank on elbows in and out- with Green Swissball 3x10 ?TRX BW row 3x10 ?Scap pull down 25lbs 10x ?Lat pull down 40lbs 4x8 ?Wall IR stretch 10s 5x  ? ?4/24 ? ? ?UBE L2.5 min retro and fwd ? ?Kneeling lat stretch with bar 5s 10x ?Wall pec stretch 90/90 30s 3x ? ?[employer] 2lb 3x10 ?Body blade 20s 3x  at 0, elbow at 90 ?90/90 ER/IR YTB 2x10 ?Kneeling pushup 2x10 ?Kneeling lat stretch with T/S rotation 5x each side ?Seated cable row 25lbs 2x10 ?Cable biceps/triceps 2x10 15lbs  ? ?4/21 ? ?L GHJ post/inf joint mobs at 90 abd and 110 flexion; grade IV ?MWM ABD grade IV ?Kneeling lat stretch with bar 5s 10x ?Neutral OHP 2lb 2x10 ?90/90 ER/IR YTB 2x10 ?Bar height push up 3x10- focus on scapular set position ?Seated cable row 25lbs 3x10 ? ? ? ?4/17: ?UBE 2 min retro ?MANUAL: post/inf joint mobs at 90 abd and 110 flexion; STM to upper trap, pec major/minor, lats, subscap, supra & infraspinatus ?Sidelying abduction 1lb ?Sidelying horiz abd 1lb ?Sidelying flexion 1lb ?Sidelying PT resisted  protraction/retraction ?Sidelying abd to 90 1lb circles     *all sidelying exercises with PT assisted/tactile cues for scapular motion ?Pulleys flexion & scaption ?IR stretch with strap ? ?PATIENT EDUCATION: ?Education details: MOI, diagnosis, prognosis, anatomy, exercise progression, DOMS expectations, muscle firing,  envelope of function, HEP, POC ? ?Person educated: Patient ?Education method: Explanation, Demonstration, Tactile cues, Verbal cues, and Handouts ?Education comprehension: verbalized understanding, returned demonstration, verbal cues required, tactile cues required, and needs further education ? ? ?HOME EXERCISE PROGRAM: ?Access Code: [email or web address] ?URL: [email or web address] ? ? ? ?ASSESSMENT: ? ?CLINICAL IMPRESSION: ?Pt able to continue with progression of CKC exercise, OH stability, and scapular stability without pain. Pt does have expected instability with increased degrees of freedom and intensity of loading but able to complete modified sets and reps without pain or discomfort. Plan to continue with scapular stability as well as eccentric cuff strength for progression towards return to sporting activity with his children. ? ?Objective impairments include decreased ROM, decreased strength, increased muscle spasms, impaired flexibility, impaired UE functional use, improper body mechanics, postural dysfunction, and pain. These impairments are limiting patient from cleaning, meal prep, occupation, yard work, and physical fitness . Personal factors including 1 comorbidity: h/o shoulder separation  are also affecting patient's functional outcome. Patient will benefit from skilled PT to address above impairments and improve overall function. ? ?REHAB POTENTIAL: Good ? ?CLINICAL DECISION MAKING: Stable/uncomplicated ? ?EVALUATION COMPLEXITY: Low ? ? ?GOALS: ?Goals reviewed with patient? Yes ? ?SHORT TERM GOALS: ? ?STG Name Target Date Goal status  ?1 Passive & AA flexion to 90 without increased  pain ?Baseline: unable at eval 03/27/2022 MET  ?2 Demo proper activation of periscapular musculature while performing isometrics ?Baseline: will progress into these exercises as appropriate 03/27/2022  MET  ?3 Pt will verbalize compliance with & understanding of restrictions while performing ADLs ?Baseline:will educate as appropriate 03/27/2022 MET  ?     ?     ?     ?     ? ?LONG TERM GOALS:  ? ?LTG Name Target Date Goal status  ?1 Flexion to 160 with minimal to no discomfort ?Baseline: no motion at eval 04/24/2022 ongoing  ?2 Able to reach center belt loop behind back without compensation ?Baseline:unable at eval 04/24/2022 ongoing  ?3 Able to demo scapular control in lifting and maneuvering 5lb weight at and above shoulder height ?Baseline:unable at eval 05/22/2022 ongoing  ?4 Will begin plyometric and CKC exercises  ?Baseline: will progress as appropriate 05/22/2022 ongoing  ?5 PT and pt will collaborate to create further objective goals  ?Baseline: to be set at later date 05/22/2022 ongoing  ?     ?     ? ?PLAN: ?PT FREQUENCY: 1-2x/week ? ?PT DURATION: 12 weeks ? ?PLANNED INTERVENTIONS: Therapeutic exercises, Therapeutic activity, Neuro Muscular re-education, Patient/Family education, Joint mobilization, Aquatic Therapy, Dry Needling, Electrical stimulation, Cryotherapy, Moist heat, scar mobilization, Taping, Vasopneumatic device, and Manual therapy ? ?PLAN FOR NEXT SESSION(s): scap stability ? ?Daleen Bo PT, DPT ?02/27/22 9:28 AM ? ? ? ? ? ? ? ?

## 2022-03-03 ENCOUNTER — Encounter (HOSPITAL_BASED_OUTPATIENT_CLINIC_OR_DEPARTMENT_OTHER): Payer: 59 | Admitting: Physical Therapy

## 2022-03-06 ENCOUNTER — Ambulatory Visit (HOSPITAL_BASED_OUTPATIENT_CLINIC_OR_DEPARTMENT_OTHER): Payer: 59 | Admitting: Physical Therapy

## 2022-03-06 ENCOUNTER — Encounter (HOSPITAL_BASED_OUTPATIENT_CLINIC_OR_DEPARTMENT_OTHER): Payer: Self-pay | Admitting: Physical Therapy

## 2022-03-06 DIAGNOSIS — M25612 Stiffness of left shoulder, not elsewhere classified: Secondary | ICD-10-CM

## 2022-03-06 DIAGNOSIS — M25512 Pain in left shoulder: Secondary | ICD-10-CM

## 2022-03-06 NOTE — Therapy (Signed)
?OUTPATIENT PHYSICAL TREATMENT NOTE ? ? ?Patient Name: Gary Hayes ?MRN: [id number] ?DOB:[birth year], 46 y.o., male ?Today's Date: 03/06/2022 ? ? PT End of Session - 03/06/22 0850   ? ? Visit Number 25   ? Number of Visits 33   ? Date for PT Re-Evaluation 03/10/22   ? Authorization Type UHC   ? PT Start Time [phone]   ? PT Stop Time 0926   ? PT Time Calculation (min) 40 min   ? Activity Tolerance Patient tolerated treatment well   ? Behavior During Therapy [facility] for tasks assessed/performed   ? ?  ?  ? ?  ? ? ? ? ? ? ? ? ? ? ? ? ? ? ? ? ? ? ?Past Medical History:  ?Diagnosis Date  ? Arthritis   ? bilateral shoulders  ? Chicken pox   ? Frequent headaches   ? GERD (gastroesophageal reflux disease)   ? Hay fever   ? ?Past Surgical History:  ?Procedure Laterality Date  ? SHOULDER ARTHROSCOPY WITH LABRAL REPAIR Left 11/15/2021  ? Procedure: left SHOULDER ARTHROSCOPY WITH LABRAL REPAIR;  Surgeon: Vanetta Mulders, MD;  Location: [place];  Service: Orthopedics;  Laterality: Left;  ? WISDOM TOOTH EXTRACTION    ? ?Patient Active Problem List  ? Diagnosis Date Noted  ? Labral tear of shoulder, degenerative, left   ? Plantar fasciitis 12/20/2018  ? Tightness of right gastrocnemius muscle 12/20/2018  ? Shoulder separation 12/20/2018  ? ? ?PCP: Billie Ruddy, MD ? ?REFERRING PROVIDER: Vanetta Mulders, MD ? ?REFERRING DIAG: s/p Labral repair ? ?THERAPY DIAG:  ?Stiffness of left shoulder, not elsewhere classified ? ?Acute pain of left shoulder ? ? ?ONSET DATE: 11/15/21-surgery ? ?SUBJECTIVE:                                                                                                                                                                                     ? ?SUBJECTIVE STATEMENT: ?Pt reports that he will occasionally feel anterior shoulder pain with lifting and is unsure if desk work related for lifting related. Notes no other pain. Slight "catch" with ABD is still present at around 90 deg. ? ? ?PERTINENT  HISTORY: ?none ? ?PAIN:  ?Are you having pain? No  ?NPRS scale: 0/10 ?Pain location: Lt shoulder ?PAIN TYPE: aching  ?Aggravating factors: being up and around ?Relieving factors: ice, meds ? ?PRECAUTIONS: Shoulder ? ?WEIGHT BEARING RESTRICTIONS Yes labral repair ? ? 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 ? ?PATIENT GOALS exercise, sports- basketball, running, baseball ? ?OBJECTIVE:  ?PATIENT SURVEYS:  ?FOTO 30 ? ?GHJ Flexion AROM 140 ?Tindeq testing 4/10: ? Right (lb) Left (lb)  ?IR  23.8 10.4  ?ER 18.1 11  ?Pull 21.2 19  ?push 27.8 22.7  ?biceps 32.4 16.8  ?Straight arm shoulder flexion 19.2 9 ?  ? ? ? ?  ?TODAY'S TREATMENT:  ?5/15 ? ?L GHJ post/inf joint mobs at end range; grade IV ?STM ant deltoid, biceps ?Passive biceps stretch with pronation  ? ?UBE L3 min retro and fwd 2 min ? ?Wall biceps stretch 30s 3x ?BOSU stability holds 15s 4x with ML pressure shifting ?Hammer curls 10lbs slow eccentric 4x6 ?Half kneeling eccentric ER ball toss/catch 2x15 2lb ball ?Eccentric 2lb ball drop/catch 90/90 20s 3x ? ? ? ? ?5/8 ? ?L GHJ post/inf joint mobs at end range; grade IV ?UBE L3 min retro and fwd 3 min ? ?Underhand/chin up pull down 25lbs 8x, 40lbs 10x, 55 lbs 2x10 ?Beardog arm raises 3x8 ?8lb OH carry walk 70f x2 ?TRX row double arm 3x10 ?Self shoulder inf and post mob with mini band at rack ? ?5/1 ? ? ?UBE L2.5 min retro and fwd 2 min ? ?Wall pec stretch 90/90 30s 2x ? ?[employer]5lb 3x8 ?Body blade 20s 3x  at 0, elbow at 90 ?BOSU plank 20s 4x ?Kneeling plank on elbows in and out- with Green Swissball 3x10 ?TRX BW row 3x10 ?Scap pull down 25lbs 10x ?Lat pull down 40lbs 4x8 ?Wall IR stretch 10s 5x  ? ?4/24 ? ? ?UBE L2.5 min retro and fwd ? ?Kneeling lat stretch with bar 5s 10x ?Wall pec stretch 90/90 30s 3x ? ?[employer]2lb 3x10 ?Body blade 20s 3x  at 0, elbow at 90 ?90/90 ER/IR YTB 2x10 ?Kneeling pushup 2x10 ?Kneeling lat stretch with T/S rotation 5x each side ?Seated cable row 25lbs 2x10 ?Cable  biceps/triceps 2x10 15lbs  ? ?4/21 ? ?L GHJ post/inf joint mobs at 90 abd and 110 flexion; grade IV ?MWM ABD grade IV ?Kneeling lat stretch with bar 5s 10x ?Neutral OHP 2lb 2x10 ?90/90 ER/IR YTB 2x10 ?Bar height push up 3x10- focus on scapular set position ?Seated cable row 25lbs 3x10 ? ? ? ?4/17: ?UBE 2 min retro ?MANUAL: post/inf joint mobs at 90 abd and 110 flexion; STM to upper trap, pec major/minor, lats, subscap, supra & infraspinatus ?Sidelying abduction 1lb ?Sidelying horiz abd 1lb ?Sidelying flexion 1lb ?Sidelying PT resisted protraction/retraction ?Sidelying abd to 90 1lb circles     *all sidelying exercises with PT assisted/tactile cues for scapular motion ?Pulleys flexion & scaption ?IR stretch with strap ? ?PATIENT EDUCATION: ?Education details: anatomy, exercise progression, DOMS expectations, muscle firing,  envelope of function, HEP, POC ? ?Person educated: Patient ?Education method: Explanation, Demonstration, Tactile cues, Verbal cues, and Handouts ?Education comprehension: verbalized understanding, returned demonstration, verbal cues required, tactile cues required, and needs further education ? ? ?HOME EXERCISE PROGRAM: ?Access Code: [email or web address]?URL: [email or web address] ? ? ? ?ASSESSMENT: ? ?CLINICAL IMPRESSION: ?Pt presents with anterior shoulder discomfort with lifting that appears consistent with a biceps tendinitis- likely due to recent volume and intensity increase with HEP. Pt advised to drop frequency to 2x per week in order to reduce anterior shoulder irritation. Pt able to perform scapular stability exercise as well as slow,heavy eccentrics for the biceps without irritation. Dynamic eccentric cuff strengthening without pain today. Pt to decrease frequency of PT visits as he is largely strength limited at this time with minimal capsular stiffness. ? ?Objective impairments include decreased ROM, decreased strength, increased muscle spasms, impaired flexibility, impaired UE  functional use, improper body mechanics, postural dysfunction, and pain. These impairments are limiting  patient from cleaning, meal prep, occupation, yard work, and physical fitness . Personal factors including 1 comorbidity: h/o shoulder separation  are also affecting patient's functional outcome. Patient will benefit from skilled PT to address above impairments and improve overall function. ? ?REHAB POTENTIAL: Good ? ?CLINICAL DECISION MAKING: Stable/uncomplicated ? ?EVALUATION COMPLEXITY: Low ? ? ?GOALS: ?Goals reviewed with patient? Yes ? ?SHORT TERM GOALS: ? ?STG Name Target Date Goal status  ?1 Passive & AA flexion to 90 without increased pain ?Baseline: unable at eval 04/03/2022 MET  ?2 Demo proper activation of periscapular musculature while performing isometrics ?Baseline: will progress into these exercises as appropriate 04/03/2022 MET  ?3 Pt will verbalize compliance with & understanding of restrictions while performing ADLs ?Baseline:will educate as appropriate 04/03/2022 MET  ?     ?     ?     ?     ? ?LONG TERM GOALS:  ? ?LTG Name Target Date Goal status  ?1 Flexion to 160 with minimal to no discomfort ?Baseline: no motion at eval 05/01/2022 ongoing  ?2 Able to reach center belt loop behind back without compensation ?Baseline:unable at eval 05/01/2022 ongoing  ?3 Able to demo scapular control in lifting and maneuvering 5lb weight at and above shoulder height ?Baseline:unable at eval 05/29/2022 ongoing  ?4 Will begin plyometric and CKC exercises  ?Baseline: will progress as appropriate 05/29/2022 ongoing  ?5 PT and pt will collaborate to create further objective goals  ?Baseline: to be set at later date 05/29/2022 ongoing  ?     ?     ? ?PLAN: ?PT FREQUENCY: 1-2x/week ? ?PT DURATION: 12 weeks ? ?PLANNED INTERVENTIONS: Therapeutic exercises, Therapeutic activity, Neuro Muscular re-education, Patient/Family education, Joint mobilization, Aquatic Therapy, Dry Needling, Electrical stimulation, Cryotherapy, Moist heat,  scar mobilization, Taping, Vasopneumatic device, and Manual therapy ? ?PLAN FOR NEXT SESSION(s): scap stability ? ?Daleen Bo PT, DPT ?03/06/22 10:03 AM ? ? ? ? ? ? ? ?

## 2022-03-10 ENCOUNTER — Encounter (HOSPITAL_BASED_OUTPATIENT_CLINIC_OR_DEPARTMENT_OTHER): Payer: 59 | Admitting: Physical Therapy

## 2022-03-22 ENCOUNTER — Encounter (HOSPITAL_BASED_OUTPATIENT_CLINIC_OR_DEPARTMENT_OTHER): Payer: Self-pay | Admitting: Physical Therapy

## 2022-03-22 ENCOUNTER — Ambulatory Visit (HOSPITAL_BASED_OUTPATIENT_CLINIC_OR_DEPARTMENT_OTHER): Payer: 59 | Admitting: Physical Therapy

## 2022-03-22 DIAGNOSIS — M25512 Pain in left shoulder: Secondary | ICD-10-CM

## 2022-03-22 DIAGNOSIS — M25612 Stiffness of left shoulder, not elsewhere classified: Secondary | ICD-10-CM

## 2022-03-22 NOTE — Therapy (Signed)
OUTPATIENT PHYSICAL TREATMENT NOTE   Patient Name: BLADYN TIPPS MRN: [id number] DOB:[birth year], 46 y.o., male Today's Date: 03/22/2022   PT End of Session - 03/22/22 1101     Visit Number 26    Number of Visits 33    Date for PT Re-Evaluation 06/14/22    Authorization Type U[employer]    PT Start Time 1102    PT Stop Time 9163    PT Time Calculation (min) 36 min    Activity Tolerance Patient tolerated treatment well    Behavior During Therapy WFL for tasks assessed/performed                             Past Medical History:  Diagnosis Date   Arthritis    bilateral shoulders   Chicken pox    Frequent headaches    GERD (gastroesophageal reflux disease)    Hay fever    Past Surgical History:  Procedure Laterality Date   SHOULDER ARTHROSCOPY WITH LABRAL REPAIR Left 11/15/2021   Procedure: left SHOULDER ARTHROSCOPY WITH LABRAL REPAIR;  Surgeon: Vanetta Mulders, MD;  Location: [place];  Service: Orthopedics;  Laterality: Left;   WISDOM TOOTH EXTRACTION     Patient Active Problem List   Diagnosis Date Noted   Labral tear of shoulder, degenerative, left    Plantar fasciitis 12/20/2018   Tightness of right gastrocnemius muscle 12/20/2018   Shoulder separation 12/20/2018    PCP: Billie Ruddy, MD  REFERRING PROVIDER: Vanetta Mulders, MD  REFERRING DIAG: s/p Labral repair  THERAPY DIAG:  Stiffness of left shoulder, not elsewhere classified  Acute pain of left shoulder   ONSET DATE: 11/15/21-surgery  SUBJECTIVE:                                                                                                                                                                                      SUBJECTIVE STATEMENT: I took about a week off of lifting which helped. Noted that playing guitar and the IR required of Lt shoulder irritated biceps tendon.    PERTINENT HISTORY: none  PAIN:  Are you having pain? No  NPRS scale: 0/10 Pain location: Lt  shoulder PAIN TYPE: aching  Aggravating factors: holding guitar Relieving factors: ice, meds PRN  PRECAUTIONS: Shoulder  WEIGHT BEARING RESTRICTIONS Yes labral repair   OCCUPATION: Engineer, mostly at a computer   PATIENT GOALS exercise, sports- basketball, running, baseball  OBJECTIVE:  PATIENT SURVEYS:  FOTO 30 FOTO 5/31: 67  GHJ Flexion AROM 160 Tindeq testing :  Right (lb) 4/10 Left (lb) 4/10 Lt (Lb) 5/31  IR 23.8 10.4 21.78  ER  18.1 11 16.5  Pull 21.2 19 22.4  push 27.8 22.7 26.2  biceps 32.4 16.8   Straight arm shoulder flexion 19.2 9         TODAY'S TREATMENT:  5/31: Large range ABCs- double leg stance and in SLS Overhead ball pass, no weight and with 1lb Primal push up, also added option for bird dog Tindeq and ROM testing  5/15  L GHJ post/inf joint mobs at end range; grade IV STM ant deltoid, biceps Passive biceps stretch with pronation   UBE L3 min retro and fwd 2 min  Wall biceps stretch 30s 3x BOSU stability holds 15s 4x with ML pressure shifting Hammer curls 10lbs slow eccentric 4x6 Half kneeling eccentric ER ball toss/catch 2x15 2lb ball Eccentric 2lb ball drop/catch 90/90 20s 3x     5/8  L GHJ post/inf joint mobs at end range; grade IV UBE L3 min retro and fwd 3 min  Underhand/chin up pull down 25lbs 8x, 40lbs 10x, 55 lbs 2x10 Beardog arm raises 3x8 8lb OH carry walk 41f x2 TRX row double arm 3x10 Self shoulder inf and post mob with mini band at rack  5/1   UBE L2.5 min retro and fwd 2 min  Wall pec stretch 90/90 30s 2x  [employer]5lb 3x8 Body blade 20s 3x  at 0, elbow at 90 BOSU plank 20s 4x Kneeling plank on elbows in and out- with Green Swissball 3x10 TRX BW row 3x10 Scap pull down 25lbs 10x Lat pull down 40lbs 4x8 Wall IR stretch 10s 5x     PATIENT EDUCATION: Education details: anatomy, exercise progression, DOMS expectations, muscle firing,  envelope of function, HEP, POC  Person educated:  Patient Education method: Explanation, Demonstration, Tactile cues, Verbal cues, and Handouts Education comprehension: verbalized understanding, returned demonstration, verbal cues required, tactile cues required, and needs further education   HOME EXERCISE PROGRAM: Access Code: [email or web address]: [email or web address]    ASSESSMENT:  CLINICAL IMPRESSION: Pt has continued to make significant strength and ROM improvements as he recovers from surgery. Will continue at a frequency of 1 every other week for exercise progression & form check. Encouraged him to message me with any questions when he is not here.   Objective impairments include decreased ROM, decreased strength, increased muscle spasms, impaired flexibility, impaired UE functional use, improper body mechanics, postural dysfunction, and pain. These impairments are limiting patient from cleaning, meal prep, occupation, yard work, and physical fitness . Personal factors including 1 comorbidity: h/o shoulder separation  are also affecting patient's functional outcome. Patient will benefit from skilled PT to address above impairments and improve overall function.  REHAB POTENTIAL: Good  CLINICAL DECISION MAKING: Stable/uncomplicated  EVALUATION COMPLEXITY: Low   GOALS: Goals reviewed with patient? Yes  SHORT TERM GOALS:  STG Name Target Date Goal status  1 Passive & AA flexion to 90 without increased pain Baseline: unable at eval 04/19/2022 MET  2 Demo proper activation of periscapular musculature while performing isometrics Baseline: will progress into these exercises as appropriate 04/19/2022 MET  3 Pt will verbalize compliance with & understanding of restrictions while performing ADLs Baseline:will educate as appropriate 04/19/2022 MET                       LONG TERM GOALS:   LTG Name Target Date Goal status  1 Flexion to 160 with minimal to no discomfort Baseline: no motion at eval 05/17/2022 achieved  2  Able to reach center belt loop behind back  without compensation Baseline:unable at eval 05/17/2022 achieved  3 Able to demo scapular control in lifting and maneuvering 5lb weight at and above shoulder height Baseline:unable at eval 06/14/2022 ongoing  4 Will begin plyometric and CKC exercises  Baseline: will progress as appropriate 06/14/2022 ongoing  5 PT and pt will collaborate to create further objective goals  Baseline: to be set at later date 06/14/2022 ongoing             PLAN: PT FREQUENCY: 1 every other week  PT DURATION: 12 weeks  PLANNED INTERVENTIONS: Therapeutic exercises, Therapeutic activity, Neuro Muscular re-education, Patient/Family education, Joint mobilization, Aquatic Therapy, Dry Needling, Electrical stimulation, Cryotherapy, Moist heat, scar mobilization, Taping, Vasopneumatic device, and Manual therapy  PLAN FOR NEXT SESSION(s): overhead stability, increase weight  Chancy Claros C. Mirranda Monrroy PT, DPT 03/22/22 12:07 PM

## 2022-04-03 ENCOUNTER — Encounter (HOSPITAL_BASED_OUTPATIENT_CLINIC_OR_DEPARTMENT_OTHER): Payer: 59 | Admitting: Physical Therapy

## 2022-04-17 ENCOUNTER — Encounter (HOSPITAL_BASED_OUTPATIENT_CLINIC_OR_DEPARTMENT_OTHER): Payer: Self-pay | Admitting: Physical Therapy

## 2022-04-17 ENCOUNTER — Ambulatory Visit (HOSPITAL_BASED_OUTPATIENT_CLINIC_OR_DEPARTMENT_OTHER): Payer: 59 | Attending: Orthopaedic Surgery | Admitting: Physical Therapy

## 2022-04-17 DIAGNOSIS — M25512 Pain in left shoulder: Secondary | ICD-10-CM | POA: Insufficient documentation

## 2022-04-17 DIAGNOSIS — M25612 Stiffness of left shoulder, not elsewhere classified: Secondary | ICD-10-CM | POA: Diagnosis present

## 2022-04-17 NOTE — Therapy (Addendum)
OUTPATIENT PHYSICAL TREATMENT NOTE  PHYSICAL THERAPY DISCHARGE SUMMARY  Visits from Start of Care: 27  Plan: Patient agrees to discharge.  Patient goals were met. Patient is being discharged due to meeting the stated rehab goals.         Patient Name: Gary Hayes MRN: [id number] DOB:03/25/1976, 46 y.o., male Today's Date: 04/17/2022   PT End of Session - 04/17/22 0845     Visit Number 27    Number of Visits 33    Date for PT Re-Evaluation 06/14/22    Authorization Type UHC    PT Start Time 0845    PT Stop Time 0925    PT Time Calculation (min) 40 min    Activity Tolerance Patient tolerated treatment well    Behavior During Therapy [facility] for tasks assessed/performed                              Past Medical History:  Diagnosis Date   Arthritis    bilateral shoulders   Chicken pox    Frequent headaches    GERD (gastroesophageal reflux disease)    Hay fever    Past Surgical History:  Procedure Laterality Date   SHOULDER ARTHROSCOPY WITH LABRAL REPAIR Left 11/15/2021   Procedure: left SHOULDER ARTHROSCOPY WITH LABRAL REPAIR;  Surgeon: Vanetta Mulders, MD;  Location: [place];  Service: Orthopedics;  Laterality: Left;   WISDOM TOOTH EXTRACTION     Patient Active Problem List   Diagnosis Date Noted   Labral tear of shoulder, degenerative, left    Plantar fasciitis 12/20/2018   Tightness of right gastrocnemius muscle 12/20/2018   Shoulder separation 12/20/2018    PCP: Billie Ruddy, MD  REFERRING PROVIDER: Vanetta Mulders, MD  REFERRING DIAG: s/p Labral repair  THERAPY DIAG:  Stiffness of left shoulder, not elsewhere classified  Acute pain of left shoulder   ONSET DATE: 11/15/21-surgery  SUBJECTIVE:                                                                                                                                                                                      SUBJECTIVE STATEMENT: Pt states he is still having some  tightness and anterior shoulder discomfort with lifting in the 120-160 range. Overall, he is back to normal activity. Pt would like to be able to return to a normal pull up.    PERTINENT HISTORY: none  PAIN:  Are you having pain? No  NPRS scale: 0/10 Pain location: Lt shoulder PAIN TYPE: aching  Aggravating factors: holding guitar Relieving factors: ice, meds PRN  PRECAUTIONS: Shoulder  WEIGHT BEARING RESTRICTIONS Yes  labral repair   OCCUPATION: Engineer, mostly at a computer   PATIENT GOALS exercise, sports- basketball, running, baseball  OBJECTIVE:  PATIENT SURVEYS:  FOTO 30 FOTO 5/31: 67  GHJ Flexion AROM 160 Tindeq testing :  Right (lb) 4/10 Left (lb) 4/10 Lt (Lb) 5/31  IR 23.8 10.4 21.78  ER 18.1 11 16.5  Pull 21.2 19 22.4  push 27.8 22.7 26.2  biceps 32.4 16.8   Straight arm shoulder flexion 19.2 9         TODAY'S TREATMENT:  6/26  L GHJ post/inf joint mobs at end range; grade IV  Self post and inf GHJ mob, black band in doorway and seated 90/90 ER stretch 10s 10x Body blade Assisted pull up at 50% BW 2x10 Single OH row 34lbs 2x10   5/31: Large range ABCs- double leg stance and in SLS Overhead ball pass, no weight and with 1lb Primal push up, also added option for bird dog Tindeq and ROM testing  5/15  L GHJ post/inf joint mobs at end range; grade IV STM ant deltoid, biceps Passive biceps stretch with pronation   UBE L3 min retro and fwd 2 min  Wall biceps stretch 30s 3x BOSU stability holds 15s 4x with ML pressure shifting Hammer curls 10lbs slow eccentric 4x6 Half kneeling eccentric ER ball toss/catch 2x15 2lb ball Eccentric 2lb ball drop/catch 90/90 20s 3x     5/8  L GHJ post/inf joint mobs at end range; grade IV UBE L3 min retro and fwd 3 min  Underhand/chin up pull down 25lbs 8x, 40lbs 10x, 55 lbs 2x10 Beardog arm raises 3x8 8lb OH carry walk 15f x2 TRX row double arm 3x10 Self shoulder inf and post mob with mini  band at rack  5/1   UBE L2.5 min retro and fwd 2 min  Wall pec stretch 90/90 30s 2x  [employer]5lb 3x8 Body blade 20s 3x  at 0, elbow at 90 BOSU plank 20s 4x Kneeling plank on elbows in and out- with Green Swissball 3x10 TRX BW row 3x10 Scap pull down 25lbs 10x Lat pull down 40lbs 4x8 Wall IR stretch 10s 5x     PATIENT EDUCATION: Education details: anatomy, exercise progression, DOMS expectations, muscle firing,  envelope of function, HEP, POC  Person educated: Patient Education method: Explanation, Demonstration, Tactile cues, Verbal cues, and Handouts Education comprehension: verbalized understanding, returned demonstration, verbal cues required, tactile cues required, and needs further education   HOME EXERCISE PROGRAM: Access Code: [email or web address]: [email or web address]    ASSESSMENT:  CLINICAL IMPRESSION: Pt presents with expected L GHJ stiffness given increase in physical activity and exercise for the L UE. Pt with reduction of shoulder discomfort following joint mobilization and demo of self mobilization technique. Pt then able to progress SL OH strengthening exercise. Pt has significantly improved OH motion and strength but able to continue with self progression of OH exercise for return to full gym activity. Pt to schedule PRN for future visits. HEP and exercise progression edu provided. Plan for D/C at next.   Objective impairments include decreased ROM, decreased strength, increased muscle spasms, impaired flexibility, impaired UE functional use, improper body mechanics, postural dysfunction, and pain. These impairments are limiting patient from cleaning, meal prep, occupation, yard work, and physical fitness . Personal factors including 1 comorbidity: h/o shoulder separation  are also affecting patient's functional outcome. Patient will benefit from skilled PT to address above impairments and improve overall function.  REHAB POTENTIAL:  Good  CLINICAL DECISION MAKING: Stable/uncomplicated  EVALUATION COMPLEXITY: Low   GOALS: Goals reviewed with patient? Yes  SHORT TERM GOALS:  STG Name Target Date Goal status  1 Passive & AA flexion to 90 without increased pain Baseline: unable at eval 05/15/2022 MET  2 Demo proper activation of periscapular musculature while performing isometrics Baseline: will progress into these exercises as appropriate 05/15/2022 MET  3 Pt will verbalize compliance with & understanding of restrictions while performing ADLs Baseline:will educate as appropriate 05/15/2022 MET                       LONG TERM GOALS:   LTG Name Target Date Goal status  1 Flexion to 160 with minimal to no discomfort Baseline: no motion at eval 06/12/2022 achieved  2 Able to reach center belt loop behind back without compensation Baseline:unable at eval 06/12/2022 achieved  3 Able to demo scapular control in lifting and maneuvering 5lb weight at and above shoulder height Baseline:unable at eval 06/14/2022 MET  4 Will begin plyometric and CKC exercises  Baseline: will progress as appropriate 06/14/2022 MET  5 PT and pt will collaborate to create further objective goals  Baseline: to be set at later date 06/14/2022 ongoing             PLAN: PT FREQUENCY: 1 every other week  PT DURATION: 12 weeks  PLANNED INTERVENTIONS: Therapeutic exercises, Therapeutic activity, Neuro Muscular re-education, Patient/Family education, Joint mobilization, Aquatic Therapy, Dry Needling, Electrical stimulation, Cryotherapy, Moist heat, scar mobilization, Taping, Vasopneumatic device, and Manual therapy  PLAN FOR NEXT SESSION(s): D/C  Daleen Bo PT, DPT 04/17/22 9:34 AM

## 2022-05-12 ENCOUNTER — Ambulatory Visit (INDEPENDENT_AMBULATORY_CARE_PROVIDER_SITE_OTHER): Payer: 59 | Admitting: Orthopaedic Surgery

## 2022-05-12 DIAGNOSIS — M7522 Bicipital tendinitis, left shoulder: Secondary | ICD-10-CM

## 2022-05-12 DIAGNOSIS — M25512 Pain in left shoulder: Secondary | ICD-10-CM | POA: Diagnosis not present

## 2022-05-12 MED ORDER — TRIAMCINOLONE ACETONIDE 40 MG/ML IJ SUSP
80.0000 mg | INTRAMUSCULAR | Status: AC | PRN
  Administered 2022-05-12: 80 mg via INTRA_ARTICULAR

## 2022-05-12 MED ORDER — LIDOCAINE HCL 1 % IJ SOLN
4.0000 mL | INTRAMUSCULAR | Status: AC | PRN
  Administered 2022-05-12: 4 mL

## 2022-05-12 NOTE — Progress Notes (Signed)
Post Operative Evaluation    Procedure/Date of Surgery: Left shoulder labral repair 11/15/21  Interval History:   Presents today 6 months status post above procedure overall is doing extremely well.  He does have some tendinitis around the biceps and the part of the shoulder which is overall improving.  Here today for final assessment.  PMH/PSH/Family History/Social History/Meds/Allergies:    Past Medical History:  Diagnosis Date  . Arthritis    bilateral shoulders  . Chicken pox   . Frequent headaches   . GERD (gastroesophageal reflux disease)   . Hay fever    Past Surgical History:  Procedure Laterality Date  . SHOULDER ARTHROSCOPY WITH LABRAL REPAIR Left 11/15/2021   Procedure: left SHOULDER ARTHROSCOPY WITH LABRAL REPAIR;  Surgeon: [name], MD;  Location: [facility];  Service: Orthopedics;  Laterality: Left;  . WISDOM TOOTH EXTRACTION     Social History   Socioeconomic History  . Marital status: Married    Spouse name: Not on file  . Number of children: Not on file  . Years of education: Not on file  . Highest education level: Bachelor's degree (e.g., BA, AB, BS)  Occupational History  . Not on file  Tobacco Use  . Smoking status: Never  . Smokeless tobacco: Never  Vaping Use  . Vaping Use: Never used  Substance and Sexual Activity  . Alcohol use: Never    Alcohol/week: 0.0 standard drinks of alcohol  . Drug use: Never  . Sexual activity: Not on file  Other Topics Concern  . Not on file  Social History Narrative  . Not on file   Social Determinants of Health   Financial Resource Strain: Low Risk  (09/05/2021)   Overall Financial Resource Strain (CARDIA)   . Difficulty of Paying Living Expenses: Not hard at all  Food Insecurity: No Food Insecurity (09/05/2021)   Hunger Vital Sign   . Worried About Programme researcher, broadcasting/film/video in the Last Year: Never true   . Ran Out of Food in the Last Year: Never true  Transportation Needs:  No Transportation Needs (09/05/2021)   PRAPARE - Transportation   . Lack of Transportation (Medical): No   . Lack of Transportation (Non-Medical): No  Physical Activity: Insufficiently Active (09/05/2021)   Exercise Vital Sign   . Days of Exercise per Week: 2 days   . Minutes of Exercise per Session: 30 min  Stress: No Stress Concern Present (09/05/2021)   [employer] of Occupational Health - Occupational Stress Questionnaire   . Feeling of Stress : Only a little  Social Connections: Unknown (09/05/2021)   Social Connection and Isolation Panel [NHANES]   . Frequency of Communication with Friends and Family: Patient refused   . Frequency of Social Gatherings with Friends and Family: Patient refused   . Attends Religious Services: Patient refused   . Active Member of Clubs or Organizations: No   . Attends Banker Meetings: Not on file   . Marital Status: Married   Family History  Problem Relation Age of Onset  . Diabetes Mother   . Cancer Father   . Heart disease Paternal Grandmother    No Known Allergies Current Outpatient Medications  Medication Sig Dispense Refill  . aspirin EC 325 MG tablet Take 1 tablet (325 mg total) by mouth daily. 30 tablet  0  . cholecalciferol (VITAMIN D3) 25 MCG (1000 UNIT) tablet Take 2,000 Units by mouth daily.    Marland Kitchen loratadine (CLARITIN) 10 MG tablet Take 10 mg by mouth daily.    Marland Kitchen oxycodone (OXY-IR) 5 MG capsule Take 1 capsule (5 mg total) by mouth every 4 (four) hours as needed (severe pain). 20 capsule 0  . Pseudoephedrine HCl (SUDAFED SINUS CONGESTION 24HR) 240 MG TB24 Take 240 mg by mouth daily as needed (congestion).     No current facility-administered medications for this visit.   No results found.  Review of Systems:   A ROS was performed including pertinent positives and negatives as documented in the HPI.   Musculoskeletal Exam:    There were no vitals taken for this visit.  Left shoulder arthroscopic incisions  are well-healed.  There is some tenderness about the anterior biceps tendon but not the glenohumeral joint active forward elevation of the left arm is to 160 equal to contralateral side.  Active external rotation at the side is to 60 compared to 60 on the contralateral side.  Internal rotation is to T12 bilaterally.  2+ radial pulse  Imaging:    None  I personally reviewed and interpreted the radiographs.   Assessment:   46 year old male 65-month status post left shoulder labral repair overall doing extremely well.  I did recommend a left ultrasound-guided biceps injection today to hopefully resolve his anterior overload phenomenon.  I will plan to see him back as needed Plan :    -Return to clinic as needed -Left shoulder biceps tendon injection performed after verbal consent obtained    Procedure Note  Patient: Gary Hayes             Date of Birth: 16-Dec-1975           MRN: [id number]             Visit Date: 05/12/2022  Procedures: Visit Diagnoses: No diagnosis found.  Large Joint Inj on 05/12/2022 8:22 AM Indications: pain Details: 22 G 1.5 in needle, ultrasound-guided anterior approach  Arthrogram: No  Medications: 4 mL lidocaine 1 %; 80 mg triamcinolone acetonide 40 MG/ML Outcome: tolerated well, no immediate complications Procedure, treatment alternatives, risks and benefits explained, specific risks discussed. Consent was given by the patient. Immediately prior to procedure a time out was called to verify the correct patient, procedure, equipment, support staff and site/side marked as required. Patient was prepped and draped in the usual sterile fashion.          I personally saw and evaluated the patient, and participated in the management and treatment plan.  [name], MD Attending Physician, Orthopedic Surgery  This document was dictated using Dragon voice recognition software. A reasonable attempt at proof reading has been made to minimize errors.

## 2022-07-13 ENCOUNTER — Encounter (HOSPITAL_BASED_OUTPATIENT_CLINIC_OR_DEPARTMENT_OTHER): Payer: Self-pay | Admitting: Orthopaedic Surgery

## 2022-07-21 ENCOUNTER — Ambulatory Visit (INDEPENDENT_AMBULATORY_CARE_PROVIDER_SITE_OTHER): Payer: 59 | Admitting: Orthopaedic Surgery

## 2022-07-21 ENCOUNTER — Other Ambulatory Visit (HOSPITAL_BASED_OUTPATIENT_CLINIC_OR_DEPARTMENT_OTHER): Payer: Self-pay

## 2022-07-21 DIAGNOSIS — S43431A Superior glenoid labrum lesion of right shoulder, initial encounter: Secondary | ICD-10-CM | POA: Diagnosis not present

## 2022-07-21 MED ORDER — OXYCODONE HCL 5 MG PO TABS
5.0000 mg | ORAL_TABLET | ORAL | 0 refills | Status: DC | PRN
  Filled 2022-07-21: qty 20, 4d supply, fill #0

## 2022-07-21 MED ORDER — ACETAMINOPHEN 500 MG PO TABS
500.0000 mg | ORAL_TABLET | Freq: Three times a day (TID) | ORAL | 0 refills | Status: AC
  Filled 2022-07-21: qty 30, 10d supply, fill #0

## 2022-07-21 MED ORDER — ASPIRIN 325 MG PO TBEC
325.0000 mg | DELAYED_RELEASE_TABLET | Freq: Every day | ORAL | 0 refills | Status: DC
  Filled 2022-07-21: qty 30, 30d supply, fill #0

## 2022-07-21 MED ORDER — IBUPROFEN 800 MG PO TABS
800.0000 mg | ORAL_TABLET | Freq: Three times a day (TID) | ORAL | 0 refills | Status: AC
  Filled 2022-07-21: qty 30, 10d supply, fill #0

## 2022-07-21 NOTE — Progress Notes (Signed)
Chief Complaint: Right shoulder pain    History of Present Illness:   07/21/2022: Presents today for follow-up of his right shoulder.  Similar to the left shoulder he does have a history of martial arts in the past where he would throw a punch with the right shoulder and feel like this would give out or displaced.  He is here today as he continues to have pain that is deep within the glenohumeral joint.  He did undergo physical therapy for both shoulders following his most recent surgery on the left.  Overall he did quite well with a left labral repair.  He is here today for further discussion.   Gary Hayes is a 46 y.o. male right-hand-dominant who presents with bilateral shoulder pain now going on for several years.  He did have a left anterior shoulder dislocation many years prior which subsequently was treated with conservative management.  He states that he has been doing physical therapy for multiple years on both of his shoulders.  He is able to get them to strengthen to a certain point but only is able to get so much relief from this.  He does continue to have recurrent episodes of feeling like the left shoulder is giving out.  He takes ibuprofen as needed for flareups.  He works as an Acupuncturist.  He has previously tried a dose of oral steroids which only helps somewhat.  He states the left shoulder pain has been more bothersome over the last several weeks.  He has a 42-year-old and 47 year old that he would like to stay active and play sports with.  He previously did martial arts but subsequently had very significant difficulty with this particularly with punches in the left arm as this would feel like the shoulder will give out.    Surgical History:   None  PMH/PSH/Family History/Social History/Meds/Allergies:    Past Medical History:  Diagnosis Date   Arthritis    bilateral shoulders   Chicken pox    Frequent headaches    GERD  (gastroesophageal reflux disease)    Hay fever    Past Surgical History:  Procedure Laterality Date   SHOULDER ARTHROSCOPY WITH LABRAL REPAIR Left 11/15/2021   Procedure: left SHOULDER ARTHROSCOPY WITH LABRAL REPAIR;  Surgeon: [name], MD;  Location: [facility] OR;  Service: Orthopedics;  Laterality: Left;   WISDOM TOOTH EXTRACTION     Social History   Socioeconomic History   Marital status: Married    Spouse name: Not on file   Number of children: Not on file   Years of education: Not on file   Highest education level: Bachelor's degree (e.g., BA, AB, BS)  Occupational History   Not on file  Tobacco Use   Smoking status: Never   Smokeless tobacco: Never  Vaping Use   Vaping Use: Never used  Substance and Sexual Activity   Alcohol use: Never    Alcohol/week: 0.0 standard drinks of alcohol   Drug use: Never   Sexual activity: Not on file  Other Topics Concern   Not on file  Social History Narrative   Not on file   Social Determinants of Health   Financial Resource Strain: Low Risk  (09/05/2021)   Overall Financial Resource Strain (CARDIA)    Difficulty of Paying Living Expenses: Not hard at  all  Food Insecurity: No Food Insecurity (09/05/2021)   Hunger Vital Sign    Worried About Running Out of Food in the Last Year: Never true    Ran Out of Food in the Last Year: Never true  Transportation Needs: No Transportation Needs (09/05/2021)   PRAPARE - Administrator, Civil Service (Medical): No    Lack of Transportation (Non-Medical): No  Physical Activity: Insufficiently Active (09/05/2021)   Exercise Vital Sign    Days of Exercise per Week: 2 days    Minutes of Exercise per Session: 30 min  Stress: No Stress Concern Present (09/05/2021)   [employer] of Occupational Health - Occupational Stress Questionnaire    Feeling of Stress : Only a little  Social Connections: Unknown (09/05/2021)   Social Connection and Isolation Panel [NHANES]    Frequency of  Communication with Friends and Family: Patient refused    Frequency of Social Gatherings with Friends and Family: Patient refused    Attends Religious Services: Patient refused    Database administrator or Organizations: No    Attends Engineer, structural: Not on file    Marital Status: Married   Family History  Problem Relation Age of Onset   Diabetes Mother    Cancer Father    Heart disease Paternal Grandmother    No Known Allergies Current Outpatient Medications  Medication Sig Dispense Refill   acetaminophen (TYLENOL) 500 MG tablet Take 1 tablet (500 mg total) by mouth every 8 (eight) hours for 10 days. 30 tablet 0   aspirin EC 325 MG tablet Take 1 tablet (325 mg total) by mouth daily. 30 tablet 0   ibuprofen (ADVIL) 800 MG tablet Take 1 tablet (800 mg total) by mouth every 8 (eight) hours for 10 days. Please take with food, please alternate with acetaminophen 30 tablet 0   oxyCODONE (OXY IR/ROXICODONE) 5 MG immediate release tablet Take 1 tablet (5 mg total) by mouth every 4 (four) hours as needed (severe pain). 20 tablet 0   aspirin EC 325 MG tablet Take 1 tablet (325 mg total) by mouth daily. 30 tablet 0   cholecalciferol (VITAMIN D3) 25 MCG (1000 UNIT) tablet Take 2,000 Units by mouth daily.     loratadine (CLARITIN) 10 MG tablet Take 10 mg by mouth daily.     oxycodone (OXY-IR) 5 MG capsule Take 1 capsule (5 mg total) by mouth every 4 (four) hours as needed (severe pain). 20 capsule 0   Pseudoephedrine HCl (SUDAFED SINUS CONGESTION 24HR) 240 MG TB24 Take 240 mg by mouth daily as needed (congestion).     No current facility-administered medications for this visit.   No results found.  Review of Systems:   A ROS was performed including pertinent positives and negatives as documented in the HPI.  Physical Exam :   Constitutional: NAD and appears stated age Neurological: Alert and oriented Psych: Appropriate affect and cooperative There were no vitals taken for this  visit.   Comprehensive Musculoskeletal Exam:    Musculoskeletal Exam    Inspection Right Left  Skin No atrophy or winging No atrophy or winging  Palpation    Tenderness Glenohumeral Glenohumeral  Range of Motion    Flexion (passive) 170 170  Flexion (active) 170 170  Abduction 170 170  ER at the side 70 70  Can reach behind back to T12 T12  Strength     Full Full  Special Tests    Pseudoparalytic No No  Neurologic  Fires PIN, radial, median, ulnar, musculocutaneous, axillary, suprascapular, long thoracic, and spinal accessory innervated muscles. No abnormal sensibility  Vascular/Lymphatic    Radial Pulse 2+ 2+  Cervical Exam    Patient has symmetric cervical range of motion with negative Spurling's test.  Special Test: Right shoulder: Positive O'Brien, 1+ anterior load-and-shift and 1+ posterior load shift     Imaging:   Xray (3 views right shoulder, 3 views left shoulder): Mild AC joint arthritis bilaterally,.  Humeral head  MRI right shoulder: There is an anterior labral tear as well as a small area of focal chondral loss involving the humeral head.  There is a small inferior humeral head osteophyte  I personally reviewed and interpreted the radiographs.   Assessment:   46 year old male with right shoulder pain consistent with an anterior labral tear.  I did discuss that he does have a small area of chondral loss involving the humeral head although realistically this is a very small area and he does not have progressive osteoarthritis of the joint.  His symptoms are consistent with labral pathology with a positive O'Brien examination.  He has done physical therapy in the past as well as anti-inflammatories for the shoulder.  This point he appears to have plateaued.  To that effect I did discuss arthroscopic surgery with labral repair.  He would like to proceed with this.  I did discuss that the inferior humeral head osteophyte could also be debrided at the time of  surgery.  Plan :    -Plan for right shoulder arthroscopy with labral repair    After a lengthy discussion of treatment options, including risks, benefits, alternatives, complications of surgical and nonsurgical conservative options, the patient elected surgical repair.   The patient  is aware of the material risks  and complications including, but not limited to injury to adjacent structures, neurovascular injury, infection, numbness, bleeding, implant failure, thermal burns, stiffness, persistent pain, failure to heal, disease transmission from allograft, need for further surgery, dislocation, anesthetic risks, blood clots, risks of death,and others. The probabilities of surgical success and failure discussed with patient given their particular co-morbidities.The time and nature of expected rehabilitation and recovery was discussed.The patient's questions were all answered preoperatively.  No barriers to understanding were noted. I explained the natural history of the disease process and Rx rationale.  I explained to the patient what I considered to be reasonable expectations given their personal situation.  The final treatment plan was arrived at through a shared patient decision making process model.   I personally saw and evaluated the patient, and participated in the management and treatment plan.  Vanetta Mulders, MD Attending Physician, Orthopedic Surgery  This document was dictated using Dragon voice recognition software. A reasonable attempt at proof reading has been made to minimize errors.

## 2022-07-26 ENCOUNTER — Ambulatory Visit (HOSPITAL_BASED_OUTPATIENT_CLINIC_OR_DEPARTMENT_OTHER): Payer: Self-pay | Admitting: Orthopaedic Surgery

## 2022-07-26 DIAGNOSIS — S43431A Superior glenoid labrum lesion of right shoulder, initial encounter: Secondary | ICD-10-CM

## 2022-08-23 ENCOUNTER — Other Ambulatory Visit: Payer: Self-pay

## 2022-08-23 ENCOUNTER — Encounter (HOSPITAL_BASED_OUTPATIENT_CLINIC_OR_DEPARTMENT_OTHER): Payer: Self-pay | Admitting: Orthopaedic Surgery

## 2022-08-28 NOTE — Progress Notes (Signed)

## 2022-09-01 ENCOUNTER — Ambulatory Visit (HOSPITAL_BASED_OUTPATIENT_CLINIC_OR_DEPARTMENT_OTHER): Payer: Self-pay | Admitting: Orthopaedic Surgery

## 2022-09-01 ENCOUNTER — Other Ambulatory Visit (HOSPITAL_BASED_OUTPATIENT_CLINIC_OR_DEPARTMENT_OTHER): Payer: Self-pay | Admitting: Orthopaedic Surgery

## 2022-09-04 ENCOUNTER — Encounter (HOSPITAL_BASED_OUTPATIENT_CLINIC_OR_DEPARTMENT_OTHER): Payer: Self-pay | Admitting: Orthopaedic Surgery

## 2022-09-04 NOTE — Anesthesia Preprocedure Evaluation (Signed)
Anesthesia Evaluation  Patient identified by MRN, date of birth, ID band Patient awake    Reviewed: Allergy & Precautions, NPO status , Patient's Chart, lab work & pertinent test results  History of Anesthesia Complications Negative for: history of anesthetic complications  Airway Mallampati: III  TM Distance: >3 FB Neck ROM: Full    Dental no notable dental hx.    Pulmonary neg pulmonary ROS   Pulmonary exam normal        Cardiovascular negative cardio ROS Normal cardiovascular exam     Neuro/Psych  Headaches    GI/Hepatic Neg liver ROS,GERD  Controlled,,  Endo/Other  negative endocrine ROS    Renal/GU negative Renal ROS  negative genitourinary   Musculoskeletal  (+) Arthritis ,  RIGHT GLENOHUMERAL LABRAL TEAR   Abdominal   Peds  Hematology negative hematology ROS (+)   Anesthesia Other Findings Day of surgery medications reviewed with patient.  Reproductive/Obstetrics negative OB ROS                              Anesthesia Physical Anesthesia Plan  ASA: 2  Anesthesia Plan: General   Post-op Pain Management: Tylenol PO (pre-op)* and Regional block*   Induction: Intravenous  PONV Risk Score and Plan: 2 and Treatment may vary due to age or medical condition, Ondansetron, Dexamethasone and Midazolam  Airway Management Planned: Oral ETT  Additional Equipment: None  Intra-op Plan:   Post-operative Plan: Extubation in OR  Informed Consent: I have reviewed the patients History and Physical, chart, labs and discussed the procedure including the risks, benefits and alternatives for the proposed anesthesia with the patient or authorized representative who has indicated his/her understanding and acceptance.     Dental advisory given  Plan Discussed with: CRNA  Anesthesia Plan Comments:         Anesthesia Quick Evaluation

## 2022-09-05 ENCOUNTER — Ambulatory Visit (HOSPITAL_BASED_OUTPATIENT_CLINIC_OR_DEPARTMENT_OTHER): Payer: 59 | Admitting: Anesthesiology

## 2022-09-05 ENCOUNTER — Encounter (HOSPITAL_BASED_OUTPATIENT_CLINIC_OR_DEPARTMENT_OTHER): Payer: Self-pay | Admitting: Orthopaedic Surgery

## 2022-09-05 ENCOUNTER — Encounter (HOSPITAL_BASED_OUTPATIENT_CLINIC_OR_DEPARTMENT_OTHER)
Admission: RE | Disposition: A | Discharge status: Home / Self Care | Payer: Self-pay | Source: Ambulatory Visit | Attending: Orthopaedic Surgery

## 2022-09-05 ENCOUNTER — Ambulatory Visit (HOSPITAL_BASED_OUTPATIENT_CLINIC_OR_DEPARTMENT_OTHER)
Admission: RE | Admit: 2022-09-05 | Discharge: 2022-09-05 | Disposition: A | Discharge status: Home / Self Care | Payer: 59 | Source: Ambulatory Visit | Attending: Orthopaedic Surgery | Admitting: Orthopaedic Surgery

## 2022-09-05 DIAGNOSIS — S43491A Other sprain of right shoulder joint, initial encounter: Secondary | ICD-10-CM | POA: Insufficient documentation

## 2022-09-05 DIAGNOSIS — S43431A Superior glenoid labrum lesion of right shoulder, initial encounter: Secondary | ICD-10-CM

## 2022-09-05 DIAGNOSIS — M24111 Other articular cartilage disorders, right shoulder: Secondary | ICD-10-CM | POA: Diagnosis not present

## 2022-09-05 DIAGNOSIS — K219 Gastro-esophageal reflux disease without esophagitis: Secondary | ICD-10-CM | POA: Insufficient documentation

## 2022-09-05 DIAGNOSIS — X58XXXA Exposure to other specified factors, initial encounter: Secondary | ICD-10-CM | POA: Diagnosis not present

## 2022-09-05 HISTORY — PX: SHOULDER ARTHROSCOPY WITH LABRAL REPAIR: SHX5691

## 2022-09-05 SURGERY — ARTHROSCOPY, SHOULDER, WITH GLENOID LABRUM REPAIR
Anesthesia: General | Site: Shoulder | Laterality: Right

## 2022-09-05 MED ORDER — FENTANYL CITRATE (PF) 100 MCG/2ML IJ SOLN: 25.0000 ug | INTRAMUSCULAR | Status: DC | PRN

## 2022-09-05 MED ORDER — ACETAMINOPHEN 500 MG PO TABS
1000.0000 mg | ORAL_TABLET | Freq: Once | ORAL | Status: AC
  Administered 2022-09-05: 1000 mg via ORAL

## 2022-09-05 MED ORDER — DEXAMETHASONE SODIUM PHOSPHATE 10 MG/ML IJ SOLN
INTRAMUSCULAR | Status: AC
  Filled 2022-09-05: qty 1

## 2022-09-05 MED ORDER — MIDAZOLAM HCL 2 MG/2ML IJ SOLN
1.0000 mg | Freq: Once | INTRAMUSCULAR | Status: AC
  Administered 2022-09-05: 1 mg via INTRAVENOUS

## 2022-09-05 MED ORDER — CEFAZOLIN SODIUM-DEXTROSE 2-4 GM/100ML-% IV SOLN
2.0000 g | INTRAVENOUS | Status: AC
  Administered 2022-09-05: 2 g via INTRAVENOUS

## 2022-09-05 MED ORDER — SODIUM CHLORIDE 0.9 % IR SOLN
Status: DC | PRN
  Administered 2022-09-05: 3000 mL

## 2022-09-05 MED ORDER — AMISULPRIDE (ANTIEMETIC) 5 MG/2ML IV SOLN: 10.0000 mg | Freq: Once | INTRAVENOUS | Status: DC | PRN

## 2022-09-05 MED ORDER — ROCURONIUM BROMIDE 10 MG/ML (PF) SYRINGE
PREFILLED_SYRINGE | INTRAVENOUS | Status: AC
  Filled 2022-09-05: qty 10

## 2022-09-05 MED ORDER — FENTANYL CITRATE (PF) 100 MCG/2ML IJ SOLN
100.0000 ug | Freq: Once | INTRAMUSCULAR | Status: AC
  Administered 2022-09-05: 100 ug via INTRAVENOUS

## 2022-09-05 MED ORDER — EPINEPHRINE PF 1 MG/ML IJ SOLN
INTRAMUSCULAR | Status: AC
  Filled 2022-09-05: qty 4

## 2022-09-05 MED ORDER — ACETAMINOPHEN 500 MG PO TABS: 1000.0000 mg | ORAL_TABLET | Freq: Once | ORAL | Status: AC

## 2022-09-05 MED ORDER — GABAPENTIN 300 MG PO CAPS
300.0000 mg | ORAL_CAPSULE | Freq: Once | ORAL | Status: AC
  Administered 2022-09-05: 300 mg via ORAL

## 2022-09-05 MED ORDER — LIDOCAINE HCL (CARDIAC) PF 100 MG/5ML IV SOSY
PREFILLED_SYRINGE | INTRAVENOUS | Status: DC | PRN
  Administered 2022-09-05: 60 mg via INTRAVENOUS

## 2022-09-05 MED ORDER — PROPOFOL 10 MG/ML IV BOLUS
INTRAVENOUS | Status: AC
  Filled 2022-09-05: qty 20

## 2022-09-05 MED ORDER — DEXAMETHASONE SODIUM PHOSPHATE 4 MG/ML IJ SOLN
INTRAMUSCULAR | Status: DC | PRN
  Administered 2022-09-05: 10 mg via INTRAVENOUS

## 2022-09-05 MED ORDER — BUPIVACAINE LIPOSOME 1.3 % IJ SUSP
INTRAMUSCULAR | Status: DC | PRN
  Administered 2022-09-05: 10 mL via PERINEURAL

## 2022-09-05 MED ORDER — FENTANYL CITRATE (PF) 100 MCG/2ML IJ SOLN
INTRAMUSCULAR | Status: AC
  Filled 2022-09-05: qty 2

## 2022-09-05 MED ORDER — ROCURONIUM BROMIDE 100 MG/10ML IV SOLN
INTRAVENOUS | Status: DC | PRN
  Administered 2022-09-05: 50 mg via INTRAVENOUS

## 2022-09-05 MED ORDER — GABAPENTIN 300 MG PO CAPS
ORAL_CAPSULE | ORAL | Status: AC
  Filled 2022-09-05: qty 1

## 2022-09-05 MED ORDER — SUGAMMADEX SODIUM 500 MG/5ML IV SOLN
INTRAVENOUS | Status: AC
  Filled 2022-09-05: qty 5

## 2022-09-05 MED ORDER — CEFAZOLIN SODIUM-DEXTROSE 2-4 GM/100ML-% IV SOLN
INTRAVENOUS | Status: AC
  Filled 2022-09-05: qty 100

## 2022-09-05 MED ORDER — PHENYLEPHRINE HCL (PRESSORS) 10 MG/ML IV SOLN
INTRAVENOUS | Status: DC | PRN
  Administered 2022-09-05 (×3): 160 ug via INTRAVENOUS
  Administered 2022-09-05: 80 ug via INTRAVENOUS

## 2022-09-05 MED ORDER — LIDOCAINE 2% (20 MG/ML) 5 ML SYRINGE
INTRAMUSCULAR | Status: AC
  Filled 2022-09-05: qty 5

## 2022-09-05 MED ORDER — ONDANSETRON HCL 4 MG/2ML IJ SOLN
INTRAMUSCULAR | Status: AC
  Filled 2022-09-05: qty 2

## 2022-09-05 MED ORDER — BUPIVACAINE HCL (PF) 0.25 % IJ SOLN
INTRAMUSCULAR | Status: AC
  Filled 2022-09-05: qty 90

## 2022-09-05 MED ORDER — MIDAZOLAM HCL 2 MG/2ML IJ SOLN
INTRAMUSCULAR | Status: AC
  Filled 2022-09-05: qty 2

## 2022-09-05 MED ORDER — ACETAMINOPHEN 500 MG PO TABS
ORAL_TABLET | ORAL | Status: AC
  Filled 2022-09-05: qty 2

## 2022-09-05 MED ORDER — OXYCODONE HCL 5 MG PO TABS: 5.0000 mg | ORAL_TABLET | Freq: Once | ORAL | Status: DC | PRN

## 2022-09-05 MED ORDER — OXYCODONE HCL 5 MG/5ML PO SOLN: 5.0000 mg | Freq: Once | ORAL | Status: DC | PRN

## 2022-09-05 MED ORDER — ONDANSETRON HCL 4 MG/2ML IJ SOLN
INTRAMUSCULAR | Status: DC | PRN
  Administered 2022-09-05: 4 mg via INTRAVENOUS

## 2022-09-05 MED ORDER — LACTATED RINGERS IV SOLN: INTRAVENOUS | Status: DC

## 2022-09-05 MED ORDER — SUGAMMADEX SODIUM 200 MG/2ML IV SOLN
INTRAVENOUS | Status: DC | PRN
  Administered 2022-09-05: 200 mg via INTRAVENOUS

## 2022-09-05 MED ORDER — PROPOFOL 10 MG/ML IV BOLUS
INTRAVENOUS | Status: DC | PRN
  Administered 2022-09-05: 170 mg via INTRAVENOUS

## 2022-09-05 MED ORDER — SODIUM CHLORIDE 0.9 % IR SOLN
Status: DC | PRN
  Administered 2022-09-05: 6000 mL

## 2022-09-05 MED ORDER — BUPIVACAINE-EPINEPHRINE (PF) 0.5% -1:200000 IJ SOLN
INTRAMUSCULAR | Status: DC | PRN
  Administered 2022-09-05: 15 mL via PERINEURAL

## 2022-09-05 MED ORDER — TRANEXAMIC ACID-NACL 1000-0.7 MG/100ML-% IV SOLN
INTRAVENOUS | Status: AC
  Filled 2022-09-05: qty 100

## 2022-09-05 MED ORDER — TRANEXAMIC ACID-NACL 1000-0.7 MG/100ML-% IV SOLN
1000.0000 mg | INTRAVENOUS | Status: AC
  Administered 2022-09-05: 1000 mg via INTRAVENOUS

## 2022-09-05 MED ORDER — FENTANYL CITRATE (PF) 100 MCG/2ML IJ SOLN
INTRAMUSCULAR | Status: DC | PRN
  Administered 2022-09-05: 50 ug via INTRAVENOUS

## 2022-09-05 SURGICAL SUPPLY — 69 items
ANCH SUT 2 FBRTK KNTLS 1.8 (Anchor) ×3 IMPLANT
ANCHOR SUT 1.8 FIBERTAK SB KL (Anchor) IMPLANT
APL PRP STRL LF DISP 70% ISPRP (MISCELLANEOUS) ×1
BLADE EXCALIBUR 4.0X13 (MISCELLANEOUS) ×1 IMPLANT
BLADE SHAVER TORPEDO 4X13 (MISCELLANEOUS) IMPLANT
BUR SURG 4D 13L RD FLUTE (BUR) IMPLANT
BURR OVAL 8 FLU 4.0X13 (MISCELLANEOUS) IMPLANT
BURR SURG 4D 13L RD FLUTE (BUR)
CANNULA 5.75X71 LONG (CANNULA) IMPLANT
CANNULA 7X7 TWIST-IN (CANNULA) IMPLANT
CANNULA 8.25X9 (CANNULA) IMPLANT
CANNULA PASSPORT 5 (CANNULA) IMPLANT
CANNULA PASSPORT BUTTON 10-40 (CANNULA) IMPLANT
CANNULA TWIST IN 8.25X7CM (CANNULA) IMPLANT
CHLORAPREP W/TINT 26 (MISCELLANEOUS) ×1 IMPLANT
COOLER ICEMAN CLASSIC (MISCELLANEOUS) ×1 IMPLANT
DRAPE IMP U-DRAPE 54X76 (DRAPES) ×1 IMPLANT
DRAPE INCISE IOBAN 66X45 STRL (DRAPES) ×1 IMPLANT
DRAPE POUCH INSTRU U-SHP 10X18 (DRAPES) ×1 IMPLANT
DRAPE SHOULDER BEACH CHAIR (DRAPES) ×1 IMPLANT
DRAPE U-SHAPE 47X51 STRL (DRAPES) ×2 IMPLANT
DRSG TEGADERM 4X4.75 (GAUZE/BANDAGES/DRESSINGS) IMPLANT
DW OUTFLOW CASSETTE/TUBE SET (MISCELLANEOUS) ×1 IMPLANT
GAUZE PAD ABD 8X10 STRL (GAUZE/BANDAGES/DRESSINGS) ×1 IMPLANT
GAUZE SPONGE 4X4 12PLY STRL (GAUZE/BANDAGES/DRESSINGS) ×1 IMPLANT
GAUZE XEROFORM 1X8 LF (GAUZE/BANDAGES/DRESSINGS) ×1 IMPLANT
GLOVE BIO SURGEON STRL SZ 6 (GLOVE) ×2 IMPLANT
GLOVE BIO SURGEON STRL SZ7.5 (GLOVE) ×1 IMPLANT
GLOVE BIOGEL PI IND STRL 6.5 (GLOVE) ×1 IMPLANT
GLOVE BIOGEL PI IND STRL 8 (GLOVE) ×1 IMPLANT
GLOVE ECLIPSE 8.0 STRL XLNG CF (GLOVE) ×1 IMPLANT
GLOVE SURG SYN 7.5  E (GLOVE) ×1
GLOVE SURG SYN 7.5 E (GLOVE) ×1 IMPLANT
GLOVE SURG SYN 7.5 PF PI (GLOVE) ×1 IMPLANT
GOWN STRL REUS W/ TWL LRG LVL3 (GOWN DISPOSABLE) ×2 IMPLANT
GOWN STRL REUS W/TWL LRG LVL3 (GOWN DISPOSABLE) ×2
GOWN STRL REUS W/TWL XL LVL3 (GOWN DISPOSABLE) ×1 IMPLANT
KIT CVD SPEAR FBRTK 1.8 DRILL (KITS) IMPLANT
KIT PERC INSERT 3.0 KNTLS (KITS) IMPLANT
KIT PUSHLOCK 2.9 HIP (KITS) IMPLANT
KIT STR SPEAR 1.8 FBRTK DISP (KITS) IMPLANT
LASSO 90 CVE QUICKPAS (DISPOSABLE) IMPLANT
LASSO CRESCENT QUICKPASS (SUTURE) IMPLANT
MANIFOLD NEPTUNE II (INSTRUMENTS) ×1 IMPLANT
NDL SAFETY ECLIP 18X1.5 (MISCELLANEOUS) ×1 IMPLANT
PACK ARTHROSCOPY DSU (CUSTOM PROCEDURE TRAY) ×1 IMPLANT
PACK BASIN DAY SURGERY FS (CUSTOM PROCEDURE TRAY) ×1 IMPLANT
PAD COLD SHLDR WRAP-ON (PAD) ×1 IMPLANT
PORT APPOLLO RF 90DEGREE MULTI (SURGICAL WAND) IMPLANT
SHEET MEDIUM DRAPE 40X70 STRL (DRAPES) ×1 IMPLANT
SLEEVE ARM SUSPENSION SYSTEM (MISCELLANEOUS) ×1 IMPLANT
SLEEVE SCD COMPRESS KNEE MED (STOCKING) ×1 IMPLANT
SLING S3 LATERAL DISP (MISCELLANEOUS) ×1 IMPLANT
SUT ETHILON 3 0 PS 1 (SUTURE) ×1 IMPLANT
SUT FIBERWIRE #2 38 T-5 BLUE (SUTURE)
SUT LASSO 45 DEG R (SUTURE) IMPLANT
SUT PDS AB 1 CT  36 (SUTURE)
SUT PDS AB 1 CT 36 (SUTURE) IMPLANT
SUT TIGER TAPE 7 IN WHITE (SUTURE) IMPLANT
SUTURE FIBERWR #2 38 T-5 BLUE (SUTURE) IMPLANT
SUTURE TAPE 1.3 40 TPR END (SUTURE) IMPLANT
SUTURE TAPE TIGERLINK 1.3MM BL (SUTURE) IMPLANT
SUTURETAPE 1.3 40 TPR END (SUTURE)
SUTURETAPE TIGERLINK 1.3MM BL (SUTURE)
SYR 5ML LL (SYRINGE) ×1 IMPLANT
TAPE FIBER 2MM 7IN #2 BLUE (SUTURE) IMPLANT
TOWEL GREEN STERILE FF (TOWEL DISPOSABLE) ×2 IMPLANT
TUBE CONNECTING 20X1/4 (TUBING) ×1 IMPLANT
TUBING ARTHROSCOPY IRRIG 16FT (MISCELLANEOUS) ×1 IMPLANT

## 2022-09-05 NOTE — Op Note (Addendum)
Date of Surgery: 09/05/2022  INDICATIONS: Gary Hayes is a 46 y.o.-year-old male with right shoulder labral tear which is failed conservative management.  The risk and benefits of the procedure were discussed in detail and documented in the pre-operative evaluation.   PREOPERATIVE DIAGNOSIS: 1.  Right shoulder labral tear 2.  Right proximal humeral posterior cartilage lesion 1 x 1 cm  POSTOPERATIVE DIAGNOSIS: Same.  PROCEDURE: 1.  Right shoulder inferior labral repair 2.  Right shoulder humeral head abrasion arthroplasty 3.  Right shoulder extensive debridement (humeral head, anterior, inferior, posterior labrum)  SURGEON: Benancio Deeds MD  ASSISTANT: Kerby Less, ATC  ANESTHESIA:  general  IV FLUIDS AND URINE: See anesthesia record.  ANTIBIOTICS: Ancef  ESTIMATED BLOOD LOSS: 10 mL.  IMPLANTS:  Implant Name Type Inv. Item Serial No. Manufacturer Lot No. LRB No. Used Action  ANCHOR SUT 1.8 FIBERTAK SB KL - [id number] Anchor ANCHOR SUT 1.8 FIBERTAK SB KL  [employer] [id number] Right 1 Implanted  ANCHOR SUT 1.8 FIBERTAK SB KL - [id number] Anchor ANCHOR SUT 1.8 FIBERTAK SB KL  [employer] [id number] Right 1 Implanted  ANCHOR SUT 1.8 FIBERTAK SB KL - [id number] Anchor ANCHOR SUT 1.8 FIBERTAK SB KL  [employer] [id number] Right 1 Implanted    DRAINS: None  CULTURES: None  COMPLICATIONS: none  DESCRIPTION OF PROCEDURE:  Examination under anesthesia revealed forward elevation of 165 degrees.  In abduction, there was 90 degrees of external rotation and 70 degrees of internal rotation.  With the arm at the side, there was 70 degrees of external rotation.  There is a 2+ anterior load shift and a 2+ posterior load shift with palpable click as the humerus moved over the glenoid.  Arthroscopic findings demonstrated:  Glenoid cartilage: Grade 2 cartilage changes involving the anterior inferior glenoid Humeral head: Grade 3 cartilage changes involving posterior humeral head measuring 1  x 1 cm, not engaging Labrum:  labral tear from 8:00 to 3:00 on the glenoid clock face Biceps insertion: Intact Biceps tendon: Intact Subscapularis insertion: Normal Rotator cuff: Normal  The patient was identified in the preoperative holding area.  The correct site was marked according to universal protocol.  Anesthesia performed an interscalene nerve block.  Ancef was given 1 hour prior to skin incision.  The patient was subsequently taken back to the operating room.  The patient was prepped and draped and positioned in the lateral position.  All bony prominences were padded.  Final timeout was performed.  Standard posterior, anterior and anterosuperolateral portals were utilized. The posterior portal was created with an 11-blade and the arthroscope introduced into the glenohumeral joint.  A full diagnostic arthroscopy was performed as described above.  A low anterior portal just above the rolled border of the subscapularis was identified with a spinal needle, and then instrumenting cannula was placed.  An anterosuperolateral viewing portal was localized with a spinal needle just posterior to the biceps tendon, and the arthroscope was transferred to this portal.  A posterior portal was also placed for instrumentation.  First, I directed my attention to the posterior humeral head.  Gary Hayes was introduced and abrasion arthroplasty was performed using the shaver on forward.  Care was taken to debride down to the level of the subchondral bone.  There was good bleeding from the site.  I then directed my attention to preparation of the glenoid, labrum and capsule for repair.  The elevator was used to elevate off the injured labrum from the glenoid rim.  A shaver  was subsequently introduced and used on forward in order to create bleeding bony bed for the labrum to heal back to.  Debridement was performed of the posterior and inferior labrum with combination of electrocautery and shaver.  Next, I sequentially  repaired the capsule and labrum from the 3:00 position to the 730 position with a total of 3 anchors.  These were all suture knotless anchors as noted above.  Anchors were placed at the 330, 5, 630 positions.  At each location, a pilot hole was drilled, the anchor inserted and deployed.  Then, one limb of suture was shuttled around the labrum and capsule using a suture lasso, taking care to provide both medial to lateral and inferior to superior shift of the tissues.  This was subsequently fed into the knotless mechanism and tensioned.  This was done sequentially for all additional anchors.  Once completed, the labrum was restored to an anatomic position, and tension was restored to both bands of the IGHL.  The inferior capsular volume was normalized and the humeral head was centered on the glenoid.   All instruments were removed, fluid was evacuated, and the arthroscopy portals were closed with 3-0 nylon.  A sterile dressing was applied with Xeroform, gauze, ABD and Medipore tape followed by a Iceman device and a sling with an abduction pillow.    The patient awoke from anesthesia without difficulty and was transferred to PACU in stable condition.      POSTOPERATIVE PLAN: He will be placed in a sling on the right shoulder.  He will begin physical therapy immediately.  I will plan to see him back in 2 weeks for suture removal and wound check.  He will be placed on aspirin for 1 month for postoperative blood clot prevention  Yevonne Pax, MD 8:51 AM

## 2022-09-05 NOTE — H&P (Signed)
Chief Complaint: Right shoulder pain      History of Present Illness:    07/21/2022: Presents today for follow-up of his right shoulder.  Similar to the left shoulder he does have a history of martial arts in the past where he would throw a punch with the right shoulder and feel like this would give out or displaced.  He is here today as he continues to have pain that is deep within the glenohumeral joint.  He did undergo physical therapy for both shoulders following his most recent surgery on the left.  Overall he did quite well with a left labral repair.  He is here today for further discussion.     Gary Hayes is a 46 y.o. male right-hand-dominant who presents with bilateral shoulder pain now going on for several years.  He did have a left anterior shoulder dislocation many years prior which subsequently was treated with conservative management.  He states that he has been doing physical therapy for multiple years on both of his shoulders.  He is able to get them to strengthen to a certain point but only is able to get so much relief from this.  He does continue to have recurrent episodes of feeling like the left shoulder is giving out.  He takes ibuprofen as needed for flareups.  He works as an Acupuncturist.  He has previously tried a dose of oral steroids which only helps somewhat.  He states the left shoulder pain has been more bothersome over the last several weeks.  He has a 20-year-old and 48 year old that he would like to stay active and play sports with.  He previously did martial arts but subsequently had very significant difficulty with this particularly with punches in the left arm as this would feel like the shoulder will give out.       Surgical History:   None   PMH/PSH/Family History/Social History/Meds/Allergies:         Past Medical History:  Diagnosis Date   Arthritis      bilateral shoulders   Chicken pox     Frequent headaches     GERD  (gastroesophageal reflux disease)     Hay fever           Past Surgical History:  Procedure Laterality Date   SHOULDER ARTHROSCOPY WITH LABRAL REPAIR Left 11/15/2021    Procedure: left SHOULDER ARTHROSCOPY WITH LABRAL REPAIR;  Surgeon: [name], MD;  Location: [facility] OR;  Service: Orthopedics;  Laterality: Left;   WISDOM TOOTH EXTRACTION        Social History         Socioeconomic History   Marital status: Married      Spouse name: Not on file   Number of children: Not on file   Years of education: Not on file   Highest education level: Bachelor's degree (e.g., BA, AB, BS)  Occupational History   Not on file  Tobacco Use   Smoking status: Never   Smokeless tobacco: Never  Vaping Use   Vaping Use: Never used  Substance and Sexual Activity   Alcohol use: Never      Alcohol/week: 0.0 standard drinks of alcohol   Drug use: Never   Sexual activity: Not on file  Other Topics Concern   Not on file  Social History Narrative   Not on file    Social Determinants of Health        Financial Resource Strain: Low Risk  (  09/05/2021)    Overall Financial Resource Strain (CARDIA)     Difficulty of Paying Living Expenses: Not hard at all  Food Insecurity: No Food Insecurity (09/05/2021)    Hunger Vital Sign     Worried About Running Out of Food in the Last Year: Never true     Ran Out of Food in the Last Year: Never true  Transportation Needs: No Transportation Needs (09/05/2021)    PRAPARE - Armed forces logistics/support/administrative officer (Medical): No     Lack of Transportation (Non-Medical): No  Physical Activity: Insufficiently Active (09/05/2021)    Exercise Vital Sign     Days of Exercise per Week: 2 days     Minutes of Exercise per Session: 30 min  Stress: No Stress Concern Present (09/05/2021)    [place]     Feeling of Stress : Only a little  Social Connections: Unknown (09/05/2021)    Social Connection  and Isolation Panel [NHANES]     Frequency of Communication with Friends and Family: Patient refused     Frequency of Social Gatherings with Friends and Family: Patient refused     Attends Religious Services: Patient refused     Marine scientist or Organizations: No     Attends Music therapist: Not on file     Marital Status: Married         Family History  Problem Relation Age of Onset   Diabetes Mother     Cancer Father     Heart disease Paternal Grandmother      No Known Allergies       Current Outpatient Medications  Medication Sig Dispense Refill   acetaminophen (TYLENOL) 500 MG tablet Take 1 tablet (500 mg total) by mouth every 8 (eight) hours for 10 days. 30 tablet 0   aspirin EC 325 MG tablet Take 1 tablet (325 mg total) by mouth daily. 30 tablet 0   ibuprofen (ADVIL) 800 MG tablet Take 1 tablet (800 mg total) by mouth every 8 (eight) hours for 10 days. Please take with food, please alternate with acetaminophen 30 tablet 0   oxyCODONE (OXY IR/ROXICODONE) 5 MG immediate release tablet Take 1 tablet (5 mg total) by mouth every 4 (four) hours as needed (severe pain). 20 tablet 0   aspirin EC 325 MG tablet Take 1 tablet (325 mg total) by mouth daily. 30 tablet 0   cholecalciferol (VITAMIN D3) 25 MCG (1000 UNIT) tablet Take 2,000 Units by mouth daily.       loratadine (CLARITIN) 10 MG tablet Take 10 mg by mouth daily.       oxycodone (OXY-IR) 5 MG capsule Take 1 capsule (5 mg total) by mouth every 4 (four) hours as needed (severe pain). 20 capsule 0   Pseudoephedrine HCl (SUDAFED SINUS CONGESTION 24HR) 240 MG TB24 Take 240 mg by mouth daily as needed (congestion).        No current facility-administered medications for this visit.    Imaging Results (Last 48 hours)  No results found.     Review of Systems:   A ROS was performed including pertinent positives and negatives as documented in the HPI.   Physical Exam :   Constitutional: NAD and appears stated  age Neurological: Alert and oriented Psych: Appropriate affect and cooperative There were no vitals taken for this visit.    Comprehensive Musculoskeletal Exam:     Musculoskeletal Exam  Inspection Right Left  Skin No atrophy or winging No atrophy or winging  Palpation      Tenderness Glenohumeral Glenohumeral  Range of Motion      Flexion (passive) 170 170  Flexion (active) 170 170  Abduction 170 170  ER at the side 70 70  Can reach behind back to T12 T12  Strength        Full Full  Special Tests      Pseudoparalytic No No  Neurologic      Fires PIN, radial, median, ulnar, musculocutaneous, axillary, suprascapular, long thoracic, and spinal accessory innervated muscles. No abnormal sensibility  Vascular/Lymphatic      Radial Pulse 2+ 2+  Cervical Exam      Patient has symmetric cervical range of motion with negative Spurling's test.  Special Test: Right shoulder: Positive O'Brien, 1+ anterior load-and-shift and 1+ posterior load shift        Imaging:   Xray (3 views right shoulder, 3 views left shoulder): Mild AC joint arthritis bilaterally,.  Humeral head   MRI right shoulder: There is an anterior labral tear as well as a small area of focal chondral loss involving the humeral head.  There is a small inferior humeral head osteophyte   I personally reviewed and interpreted the radiographs.     Assessment:   46 year old male with right shoulder pain consistent with an anterior labral tear.  I did discuss that he does have a small area of chondral loss involving the humeral head although realistically this is a very small area and he does not have progressive osteoarthritis of the joint.  His symptoms are consistent with labral pathology with a positive O'Brien examination.  He has done physical therapy in the past as well as anti-inflammatories for the shoulder.  This point he appears to have plateaued.  To that effect I did discuss arthroscopic surgery with labral  repair.  He would like to proceed with this.  I did discuss that the inferior humeral head osteophyte could also be debrided at the time of surgery.   Plan :     -Plan for right shoulder arthroscopy with labral repair       After a lengthy discussion of treatment options, including risks, benefits, alternatives, complications of surgical and nonsurgical conservative options, the patient elected surgical repair.    The patient  is aware of the material risks  and complications including, but not limited to injury to adjacent structures, neurovascular injury, infection, numbness, bleeding, implant failure, thermal burns, stiffness, persistent pain, failure to heal, disease transmission from allograft, need for further surgery, dislocation, anesthetic risks, blood clots, risks of death,and others. The probabilities of surgical success and failure discussed with patient given their particular co-morbidities.The time and nature of expected rehabilitation and recovery was discussed.The patient's questions were all answered preoperatively.  No barriers to understanding were noted. I explained the natural history of the disease process and Rx rationale.  I explained to the patient what I considered to be reasonable expectations given their personal situation.  The final treatment plan was arrived at through a shared patient decision making process model.     I personally saw and evaluated the patient, and participated in the management and treatment plan.   Vanetta Mulders, MD Attending Physician, Orthopedic Surgery   This document was dictated using Dragon voice recognition software. A reasonable attempt at proof reading has been made to minimize errors.

## 2022-09-05 NOTE — Anesthesia Postprocedure Evaluation (Signed)
Anesthesia Post Note  Patient: Gary Hayes  Procedure(s) Performed: RIGHT SHOULDER ARTHROSCOPY WITH LABRAL REPAIR (Right: Shoulder)     Patient location during evaluation: PACU Anesthesia Type: General Level of consciousness: awake and alert Pain management: pain level controlled Vital Signs Assessment: post-procedure vital signs reviewed and stable Respiratory status: spontaneous breathing, nonlabored ventilation and respiratory function stable Cardiovascular status: blood pressure returned to baseline Postop Assessment: no apparent nausea or vomiting Anesthetic complications: no   No notable events documented.  Last Vitals:  Vitals:   09/05/22 0915 09/05/22 0928  BP: 127/88 109/79  Pulse: 82 84  Resp: 14   Temp:  36.6 C  SpO2: 97% 95%    Last Pain:  Vitals:   09/05/22 0928  TempSrc: Oral  PainSc: 0-No pain                 [name]

## 2022-09-05 NOTE — Brief Op Note (Signed)
   Brief Op Note  Date of Surgery: 09/05/2022  Preoperative Diagnosis: RIGHT GLENOHUMERAL LABRAL TEAR  Postoperative Diagnosis: same  Procedure: Procedure(s): RIGHT SHOULDER ARTHROSCOPY WITH LABRAL REPAIR  Implants: Implant Name Type Inv. Item Serial No. Manufacturer Lot No. LRB No. Used Action  ANCHOR SUT 1.8 FIBERT[employer] SB KL - [id number] Anchor ANCHOR SUT 1.8 [employer]IBERTAK SB KL  [employer] [id number] Right 1 Implanted  ANCHOR SUT 1.8 [employer]IBERT[employer] SB KL - [id number] Anchor ANCHOR SUT 1.8 [employer] SB KL  [employer] [id number] Right 1 Implanted  ANCHOR SUT 1.8 [employer] SB KL - [id number] Anchor ANCHOR SUT 1.8 [employer] [id number] Right 1 Implanted    Surgeons: Surgeon(s): [name], MD  Anesthesia: Regional    Estimated Blood Loss: See anesthesia record  Complications: None  Condition to PACU: Stable  Benancio Deeds, MD 09/05/2022 8:51 AM

## 2022-09-05 NOTE — Interval H&P Note (Signed)
History and Physical Interval Note:  09/05/2022 6:56 AM  Gary Hayes  has presented today for surgery, with the diagnosis of RIGHT GLENOHUMERAL LABRAL TEAR.  The various methods of treatment have been discussed with the patient and family. After consideration of risks, benefits and other options for treatment, the patient has consented to  Procedure(s): RIGHT SHOULDER ARTHROSCOPY WITH LABRAL REPAIR (Right) as a surgical intervention.  The patient's history has been reviewed, patient examined, no change in status, stable for surgery.  I have reviewed the patient's chart and labs.  Questions were answered to the patient's satisfaction.     [name]

## 2022-09-05 NOTE — Progress Notes (Signed)
Assisted Dr. Howze with right, interscalene  block. Side rails up, monitors on throughout procedure. See vital signs in flow sheet. Tolerated Procedure well. 

## 2022-09-05 NOTE — Anesthesia Procedure Notes (Signed)
Anesthesia Regional Block: Interscalene brachial plexus block   Pre-Anesthetic Checklist: , timeout performed,  Correct Patient, Correct Site, Correct Laterality,  Correct Procedure, Correct Position, site marked,  Risks and benefits discussed,  Pre-op evaluation,  At surgeon's request and post-op pain management  Laterality: Right  Prep: Maximum Sterile Barrier Precautions used, chloraprep       Needles:  Injection technique: Single-shot  Needle Type: Echogenic Stimulator Needle     Needle Length: 4cm  Needle Gauge: 22     Additional Needles:   Procedures:,,,, ultrasound used (permanent image in chart),,    Narrative:  Start time: 09/05/2022 6:57 AM End time: 09/05/2022 7:00 AM Injection made incrementally with aspirations every 5 mL.  Performed by: Personally  Anesthesiologist: Kaylyn Layer, MD  Additional Notes: Risks, benefits, and alternative discussed. Patient gave consent for procedure. Patient prepped and draped in sterile fashion. Sedation administered, patient remains easily responsive to voice. Relevant anatomy identified with ultrasound guidance. Local anesthetic given in 5cc increments with no signs or symptoms of intravascular injection. No pain or paraesthesias with injection. Patient monitored throughout procedure with signs of LAST or immediate complications. Tolerated well. Ultrasound image placed in chart.  Gary Greenhouse, MD

## 2022-09-05 NOTE — Transfer of Care (Signed)
Immediate Anesthesia Transfer of Care Note  Patient: Gary Hayes  Procedure(s) Performed: RIGHT SHOULDER ARTHROSCOPY WITH LABRAL REPAIR (Right: Shoulder)  Patient Location: PACU  Anesthesia Type:General and Regional  Level of Consciousness: awake, alert , and patient cooperative  Airway & Oxygen Therapy: Patient Spontanous Breathing and Patient connected to face mask oxygen  Post-op Assessment: Report given to RN and Post -op Vital signs reviewed and stable  Post vital signs: Reviewed and stable  Last Vitals:  Vitals Value Taken Time  BP 117/83 09/05/22 0900  Temp    Pulse 82 09/05/22 0901  Resp 19 09/05/22 0901  SpO2 99 % 09/05/22 0901  Vitals shown include unvalidated device data.  Last Pain:  Vitals:   09/05/22 5929  TempSrc: Oral  PainSc: 0-No pain      Patients Stated Pain Goal: 3 (09/05/22 2446)  Complications: No notable events documented.

## 2022-09-05 NOTE — Discharge Instructions (Addendum)
Post Anesthesia Home Care Instructions  Activity: Get plenty of rest for the remainder of the day. A responsible individual must stay with you for 24 hours following the procedure. Next tylenol dose 1:38pm. For the next 24 hours, DO NOT: -Drive a car -Paediatric nurse -Drink alcoholic beverages -Take any medication unless instructed by your physician -Make any legal decisions or sign important papers.  Meals: Start with liquid foods such as gelatin or soup. Progress to regular foods as tolerated. Avoid greasy, spicy, heavy foods. If nausea and/or vomiting occur, drink only clear liquids until the nausea and/or vomiting subsides. Call your physician if vomiting continues.  Special Instructions/Symptoms: Your throat may feel dry or sore from the anesthesia or the breathing tube placed in your throat during surgery. If this causes discomfort, gargle with warm salt water. The discomfort should disappear within 24 hours.  If you had a scopolamine patch placed behind your ear for the management of post- operative nausea and/or vomiting:  1. The medication in the patch is effective for 72 hours, after which it should be removed.  Wrap patch in a tissue and discard in the trash. Wash hands thoroughly with soap and water. 2. You may remove the patch earlier than 72 hours if you experience unpleasant side effects which may include dry mouth, dizziness or visual disturbances. 3. Avoid touching the patch. Wash your hands with soap and water after contact with the patch.    Post Anesthesia Home Care Instructions  Activity: Get plenty of rest for the remainder of the day. A responsible individual must stay with you for 24 hours following the procedure.  For the next 24 hours, DO NOT: -Drive a car -Paediatric nurse -Drink alcoholic beverages -Take any medication unless instructed by your physician -Make any legal decisions or sign important papers.  Meals: Start with liquid foods such as  gelatin or soup. Progress to regular foods as tolerated. Avoid greasy, spicy, heavy foods. If nausea and/or vomiting occur, drink only clear liquids until the nausea and/or vomiting subsides. Call your physician if vomiting continues.  Special Instructions/Symptoms: Your throat may feel dry or sore from the anesthesia or the breathing tube placed in your throat during surgery. If this causes discomfort, gargle with warm salt water. The discomfort should disappear within 24 hours.  If you had a scopolamine patch placed behind your ear for the management of post- operative nausea and/or vomiting:  1. The medication in the patch is effective for 72 hours, after which it should be removed.  Wrap patch in a tissue and discard in the trash. Wash hands thoroughly with soap and water. 2. You may remove the patch earlier than 72 hours if you experience unpleasant side effects which may include dry mouth, dizziness or visual disturbances. 3. Avoid touching the patch. Wash your hands with soap and water after contact with the patch.   Regional Anesthesia Blocks  1. Numbness or the inability to move the "blocked" extremity may last from 3-48 hours after placement. The length of time depends on the medication injected and your individual response to the medication. If the numbness is not going away after 48 hours, call your surgeon.  2. The extremity that is blocked will need to be protected until the numbness is gone and the  Strength has returned. Because you cannot feel it, you will need to take extra care to avoid injury. Because it may be weak, you may have difficulty moving it or using it. You may not know what  position it is in without looking at it while the block is in effect.  3. For blocks in the legs and feet, returning to weight bearing and walking needs to be done carefully. You will need to wait until the numbness is entirely gone and the strength has returned. You should be able to move your leg  and foot normally before you try and bear weight or walk. You will need someone to be with you when you first try to ensure you do not fall and possibly risk injury.  4. Bruising and tenderness at the needle site are common side effects and will resolve in a few days.  5. Persistent numbness or new problems with movement should be communicated to the surgeon or the [facility] [phone] [facility] [phone]).Regional Anesthesia Blocks  1. Numbness or the inability to move the "blocked" extremity may last from 3-48 hours after placement. The length of time depends on the medication injected and your individual response to the medication. If the numbness is not going away after 48 hours, call your surgeon.  2. The extremity that is blocked will need to be protected until the numbness is gone and the  Strength has returned. Because you cannot feel it, you will need to take extra care to avoid injury. Because it may be weak, you may have difficulty moving it or using it. You may not know what position it is in without looking at it while the block is in effect.  3. For blocks in the legs and feet, returning to weight bearing and walking needs to be done carefully. You will need to wait until the numbness is entirely gone and the strength has returned. You should be able to move your leg and foot normally before you try and bear weight or walk. You will need someone to be with you when you first try to ensure you do not fall and possibly risk injury.  4. Bruising and tenderness at the needle site are common side effects and will resolve in a few days.  5. Persistent numbness or new problems with movement should be communicated to the surgeon or the [facility] [phone] [facility] [phone]).    Discharge Instructions    Attending Surgeon: [name], MD Office Phone Number: [phone]   Diagnosis and Procedures:     Surgeries Performed: Right shoulder labral tear  Discharge Plan:    Diet: Resume usual diet. Begin with light or bland foods.  Drink plenty of fluids.  Activity:  Keep sling and dressing in place until your follow up visit in Physical Therapy You are advised to go home directly from the hospital or surgical center. Restrict your activities.  GENERAL INSTRUCTIONS: 1.  Keep your surgical site elevated above your heart for at least 5-7 days or longer to prevent swelling. This will improve your comfort and your overall recovery following surgery.     2. Please call Dr. Serena Croissant office at [phone] with questions Monday-Friday during business hours. If no one answers, please leave a message and someone should get back to the patient within 24 hours. For emergencies please call 911 or proceed to the emergency room.   3. Patient to notify surgical team if experiences any of the following: Bowel/Bladder dysfunction, uncontrolled pain, nerve/muscle weakness, incision with increased drainage or redness, nausea/vomiting and Fever greater than 101.0 F.  Be alert for signs of infection including redness, streaking, odor, fever or chills. Be  alert for excessive pain or bleeding and notify your surgeon immediately.  WOUND INSTRUCTIONS:   Leave your dressing/cast/splint in place until your post operative visit.  Keep it clean and dry.  Always keep the incision clean and dry until the staples/sutures are removed. If there is no drainage from the incision you should keep it open to air. If there is drainage from the incision you must keep it covered at all times until the drainage stops  Do not soak in a bath tub, hot tub, pool, lake or other body of water until 21 days after your surgery and your incision is completely dry and healed.  If you have removable sutures (or staples) they must be removed 10-14 days (unless otherwise instructed) from the day of your surgery.     1)  Elevate the  extremity as much as possible.  2)  Keep the dressing clean and dry.  3)  Please call us if the dressing becomes wet or dirty.  4)  If you are experiencing worsening pain or worsening swelling, please call.     MEDICATIONS: Resume all previous home medications at the previous prescribed dose and frequency unless otherwise noted Start taking the  pain medications on an as-needed basis as prescribed  Please taper down pain medication over the next week following surgery.  Ideally you should not require a refill of any narcotic pain medication.  Take pain medication with food to minimize nausea. In addition to the prescribed pain medication, you may take over-the-counter pain relievers such as Tylenol.  Do NOT take additional tylenol if your pain medication already has tylenol in it.  Aspirin 325mg  daily for four weeks.      FOLLOWUP INSTRUCTIONS: 1. Follow up at the Physical Therapy Clinic 3-4 days following surgery. This appointment should be scheduled unless other arrangements have been made.The Physical Therapy scheduling number is [phone] if an appointment has not already been arranged.Information for Discharge Teaching: EXPAREL (bupivacaine liposome injectable suspension)   Your surgeon or anesthesiologist gave you EXPAREL(bupivacaine) to help control your pain after surgery.  EXPAREL is a local anesthetic that provides pain relief by numbing the tissue around the surgical site. EXPAREL is designed to release pain medication over time and can control pain for up to 72 hours. Depending on how you respond to EXPAREL, you may require less pain medication during your recovery.  Possible side effects: Temporary loss of sensation or ability to move in the area where bupivacaine was injected. Nausea, vomiting, constipation Rarely, numbness and tingling in your mouth or lips, lightheadedness, or anxiety may occur. Call your doctor right away if you think you may be experiencing any of  these sensations, or if you have other questions regarding possible side effects.  Follow all other discharge instructions given to you by your surgeon or nurse. Eat a healthy diet and drink plenty of water or other fluids.  If you return to the hospital for any reason within 96 hours following the administration of EXPAREL, it is important for health care providers to know that you have received this anesthetic. A teal colored band has been placed on your arm with the date, time and amount of EXPAREL you have received in order to alert and inform your health care providers. Please leave this armband in place for the full 96 hours following administration, and then you may remove the band.Information for Discharge Teaching: EXPAREL (bupivacaine liposome injectable suspension)   Your surgeon or anesthesiologist gave you EXPAREL(bupivacaine) to help control  your pain after surgery.  EXPAREL is a local anesthetic that provides pain relief by numbing the tissue around the surgical site. EXPAREL is designed to release pain medication over time and can control pain for up to 72 hours. Depending on how you respond to EXPAREL, you may require less pain medication during your recovery.  Possible side effects: Temporary loss of sensation or ability to move in the area where bupivacaine was injected. Nausea, vomiting, constipation Rarely, numbness and tingling in your mouth or lips, lightheadedness, or anxiety may occur. Call your doctor right away if you think you may be experiencing any of these sensations, or if you have other questions regarding possible side effects.  Follow all other discharge instructions given to you by your surgeon or nurse. Eat a healthy diet and drink plenty of water or other fluids.  If you return to the hospital for any reason within 96 hours following the administration of EXPAREL, it is important for health care providers to know that you have received this anesthetic. A teal  colored band has been placed on your arm with the date, time and amount of EXPAREL you have received in order to alert and inform your health care providers. Please leave this armband in place for the full 96 hours following administration, and then you may remove the band.  2. Contact Dr. Eddie Dibbles office during office hours at [phone] or the practice after hours line at [phone] for non-emergencies. For medical emergencies call 911.   Discharge Location: Home

## 2022-09-05 NOTE — Anesthesia Procedure Notes (Signed)
Procedure Name: Intubation Date/Time: 09/05/2022 7:45 AM  Performed by: Verita Lamb, CRNAPre-anesthesia Checklist: Patient identified, Emergency Drugs available, Suction available and Patient being monitored Patient Re-evaluated:Patient Re-evaluated prior to induction Oxygen Delivery Method: Circle system utilized Preoxygenation: Pre-oxygenation with 100% oxygen Induction Type: IV induction Ventilation: Mask ventilation without difficulty Laryngoscope Size: Mac and 4 Grade View: Grade I Tube type: Oral Tube size: 7.0 mm Number of attempts: 1 Airway Equipment and Method: Stylet and Oral airway Placement Confirmation: ETT inserted through vocal cords under direct vision, positive ETCO2, breath sounds checked- equal and bilateral and CO2 detector Secured at: 24 cm Tube secured with: Tape Dental Injury: Teeth and Oropharynx as per pre-operative assessment

## 2022-09-05 NOTE — Addendum Note (Signed)
Addendum  created 09/05/22 1514 by Karen Kitchens, CRNA   Intraprocedure Meds edited

## 2022-09-06 ENCOUNTER — Encounter (HOSPITAL_BASED_OUTPATIENT_CLINIC_OR_DEPARTMENT_OTHER): Payer: Self-pay | Admitting: Orthopaedic Surgery

## 2022-09-06 NOTE — Progress Notes (Signed)
Left message stating courtesy call and if any questions or concerns please call the doctors office.  

## 2022-09-08 ENCOUNTER — Ambulatory Visit (HOSPITAL_BASED_OUTPATIENT_CLINIC_OR_DEPARTMENT_OTHER): Payer: 59 | Attending: Orthopaedic Surgery | Admitting: Physical Therapy

## 2022-09-08 ENCOUNTER — Encounter (HOSPITAL_BASED_OUTPATIENT_CLINIC_OR_DEPARTMENT_OTHER): Payer: Self-pay | Admitting: Physical Therapy

## 2022-09-08 DIAGNOSIS — M25511 Pain in right shoulder: Secondary | ICD-10-CM | POA: Insufficient documentation

## 2022-09-08 DIAGNOSIS — R600 Localized edema: Secondary | ICD-10-CM | POA: Insufficient documentation

## 2022-09-08 DIAGNOSIS — M25611 Stiffness of right shoulder, not elsewhere classified: Secondary | ICD-10-CM | POA: Diagnosis present

## 2022-09-08 DIAGNOSIS — S43431A Superior glenoid labrum lesion of right shoulder, initial encounter: Secondary | ICD-10-CM | POA: Diagnosis not present

## 2022-09-08 NOTE — Therapy (Signed)
OUTPATIENT PHYSICAL THERAPY UPPER EXTREMITY EVALUATION   Patient Name: Gary Hayes MRN: [id number] DOB:30-Sep-1976, 46 y.o., male Today's Date: 09/08/2022  END OF SESSION:  PT End of Session - 09/08/22 0927     Visit Number 1    Number of Visits 25    Date for PT Re-Evaluation 12/07/22    Authorization Type [employer]    PT Start Time 0845    PT Stop Time 0913    PT Time Calculation (min) 28 min    Activity Tolerance Patient tolerated treatment well    Behavior During Therapy [facility] for tasks assessed/performed             Past Medical History:  Diagnosis Date   Arthritis    bilateral shoulders   Chicken pox    Frequent headaches    Hay fever    Past Surgical History:  Procedure Laterality Date   SHOULDER ARTHROSCOPY WITH LABRAL REPAIR Left 11/15/2021   Procedure: left SHOULDER ARTHROSCOPY WITH LABRAL REPAIR;  Surgeon: [name], MD;  Location: [facility] OR;  Service: Orthopedics;  Laterality: Left;   SHOULDER ARTHROSCOPY WITH LABRAL REPAIR Right 09/05/2022   Procedure: RIGHT SHOULDER ARTHROSCOPY WITH LABRAL REPAIR;  Surgeon: [name], MD;  Location: [facility];  Service: Orthopedics;  Laterality: Right;   WISDOM TOOTH EXTRACTION     Patient Active Problem List   Diagnosis Date Noted   Degenerative superior labral anterior-to-posterior (SLAP) tear of right shoulder    Plantar fasciitis 12/20/2018   Tightness of right gastrocnemius muscle 12/20/2018   Shoulder separation 12/20/2018    PCP: Deeann Saint, MD   REFERRING PROVIDER:   [name], MD    REFERRING DIAG:  Diagnosis  [phone] (ICD-10-CM) - Labral tear of shoulder, right, initial encounter    THERAPY DIAG:  Acute pain of right shoulder  Stiffness of right shoulder, not elsewhere classified  Localized edema  Rationale for Evaluation and Treatment: Rehabilitation  ONSET DATE: 09/05/22 DOS  Days since surgery: 3   1.  Right shoulder inferior labral repair 2.  Right  shoulder humeral head abrasion arthroplasty 3.  Right shoulder extensive debridement (humeral head, anterior, inferior, posterior labrum)    SUBJECTIVE:                                                                                                                                                                                      SUBJECTIVE STATEMENT: Pt is sleeping in a chair at this time. Pt has been compliant with sling usage. Pt is 3 days out from surgery. Pt denies NT. Pt remembers precautions from previous surgery.   PERTINENT HISTORY: L labral repair  PAIN:  Are you having pain? Yes: NPRS scale: 2/10 Pain location: surgical incision sites  Pain description: sharp/aching Aggravating factors: movement Relieving factors: n/a  PRECAUTIONS: Shoulder   WEIGHT BEARING RESTRICTIONS Yes labral repair   FALLS:  Has patient fallen in last 6 months? No Number of falls: 0   LIVING ENVIRONMENT: Lives with: lives with their family and lives with their spouse     OCCUPATION: Art gallery manager, mostly at a computer   PLOF: Independent   PATIENT GOALS exercise, sports- basketball, running, baseball  OBJECTIVE:   DIAGNOSTIC FINDINGS:   IMPRESSION: 1. Moderate right glenohumeral joint osteoarthritis. 2. Superior labral degeneration without a well-defined tear. 3. Mild infraspinatus greater than supraspinatus tendinosis. No rotator cuff tear. 4. Mild intra-articular biceps tendinosis. 5. Moderate arthropathy of the AC joint.    PATIENT SURVEYS:  FOTO 29 68 at DC 23 pts MCII  COGNITION: Overall cognitive status: Within functional limits for tasks assessed     SENSATION: WFL; no distal paresthesia noted  POSTURE: No significant postural deviations; forward shoulder on the right side as expected in sling position  UPPER EXTREMITY ROM: Full endrange passive range of motion deferred at this time given 3 days postop  Passive ROM Right eval Left eval  Shoulder flexion 50 WFL   Shoulder extension  WFL  Shoulder abduction 35 WFL  Shoulder adduction    Shoulder internal rotation 35 WFL  Shoulder external rotation 0 WFL  Elbow flexion 120 WFL  Elbow extension -20 WFL  (Blank rows = not tested)  UPPER EXTREMITY MMT: not indicated at evaluation   PALPATION:  No erythema, drainage, or signs of infection noted; no tenderness to palpation   TODAY'S TREATMENT:                                                                                                                                         DATE: 09/08/22  Surgical bandage change; removal of surgical bandages, sanitizing of surgical area, fresh gauze and Tegaderm placed, Xeroform bandages kept on  Exercises - Seated Scapular Retraction  - 2-3 x daily - 7 x weekly - 2 sets - 10 reps - Elbow Flexion PROM  - 2-3 x daily - 7 x weekly - 2 sets - 10 reps - Putty Squeezes  - 5-6 x daily - 7 x weekly - 1 sets - 20 reps  PATIENT EDUCATION: Education details: Protocol/safety, edema management, signs and symptoms of infection, diagnosis, prognosis, anatomy, exercise progression, envelope of function, HEP, POC Person educated: Patient Education method: Explanation, Demonstration, Tactile cues, Verbal cues, and Handouts Education comprehension: verbalized understanding and returned demonstration  HOME EXERCISE PROGRAM:  Access Code: [email or web address] URL: [email or web address] Date: 09/08/2022 Prepared by: Zebedee Iba  ASSESSMENT:  CLINICAL IMPRESSION: Patient is a 46 y.o. male who was seen today for physical therapy evaluation and treatment for s/p R GHJ labral repair.  Patient with expected range of motion  and strength limitations at this time given 3 days postop.  Patient with good compliance with sling usage and is aware of current safety precautions and protocol.  Given patient has had left labral repair done in the past patient has good understanding of current limitations and safety.  Plan to continue per  labral repair protocol with passive range of motion.  No pain noted today with base level exercises.  Patient will benefit from continued skilled therapy in order to progress right upper extremity range of motion and strength for full return to prior level of function.   OBJECTIVE IMPAIRMENTS: decreased ROM, decreased strength, hypomobility, increased edema, increased fascial restrictions, increased muscle spasms, impaired flexibility, impaired sensation, impaired UE functional use, improper body mechanics, postural dysfunction, and pain.   ACTIVITY LIMITATIONS: carrying, lifting, sleeping, transfers, bed mobility, bathing, dressing, reach over head, and hygiene/grooming  PARTICIPATION LIMITATIONS: meal prep, cleaning, laundry, driving, shopping, community activity, occupation, and exercise  PERSONAL FACTORS: Time since onset of injury/illness/exacerbation and 1 comorbidity:    are also affecting patient's functional outcome.   REHAB POTENTIAL: Good  CLINICAL DECISION MAKING: Stable/uncomplicated  EVALUATION COMPLEXITY: Low  GOALS:   SHORT TERM GOALS: Target date: 10/20/2022    Pt will become independent with HEP in order to demonstrate synthesis of PT education. Baseline: Goal status: INITIAL  2.  Pt will be able to demonstrate PROM flexion to 140 and ABD to 80 in order to demonstrate functional improvement in UE ROM for progression to next phase of rehab.  Baseline:  Goal status: INITIAL  3.  Pt will score at least 23 pt increase on FOTO to demonstrate functional improvement in MCII and pt perceived function.   Baseline:  Goal status: INITIAL    LONG TERM GOALS: Target date: 12/01/2022   Pt  will become independent with final HEP in order to demonstrate synthesis of PT education.  Baseline:  Goal status: INITIAL  2.  Pt will score >/= 68 on FOTO to demonstrate improvement in perceived right upper extremity function.  Baseline:  Goal status: INITIAL  3.  Pt will be  able to demonstrate full AROM in order to demonstrate functional improvement in UE function for self-care and house hold duties.  Baseline:  Goal status: INITIAL  4.  Pt will be able to demonstrate ability to lift >/= 3lbs in order to demonstrate functional improvement in UE function for self-care and house hold duties.  Baseline:  Goal status: INITIAL  5.  Pt will be able to demonstrate ability to being light to moderate resistance training in order to demonstrate functional improvement in UE function for progression towards exercise.  Baseline:  Goal status: INITIAL   PLAN: PT FREQUENCY: 1-2x/week  PT DURATION: 12 weeks  PLANNED INTERVENTIONS: Therapeutic exercises, Therapeutic activity, Neuromuscular re-education, Balance training, Gait training, Patient/Family education, Self Care, Joint mobilization, Joint manipulation, Orthotic/Fit training, DME instructions, Aquatic Therapy, Dry Needling, Electrical stimulation, Spinal manipulation, Spinal mobilization, Cryotherapy, Moist heat, Manual lymph drainage, scar mobilization, Taping, Vasopneumatic device, Traction, Ultrasound, Ionotophoresis 4mg /ml Dexamethasone, Manual therapy, and Re-evaluation  PLAN FOR NEXT SESSION: Passive range of motion per protocol; shoulder instability protocol   , PT 09/08/2022, 9:28 AM

## 2022-09-13 ENCOUNTER — Encounter (HOSPITAL_BASED_OUTPATIENT_CLINIC_OR_DEPARTMENT_OTHER): Payer: Self-pay | Admitting: Physical Therapy

## 2022-09-13 ENCOUNTER — Ambulatory Visit (HOSPITAL_BASED_OUTPATIENT_CLINIC_OR_DEPARTMENT_OTHER): Payer: 59 | Admitting: Physical Therapy

## 2022-09-13 DIAGNOSIS — M25611 Stiffness of right shoulder, not elsewhere classified: Secondary | ICD-10-CM

## 2022-09-13 DIAGNOSIS — M25511 Pain in right shoulder: Secondary | ICD-10-CM | POA: Diagnosis not present

## 2022-09-13 DIAGNOSIS — R600 Localized edema: Secondary | ICD-10-CM

## 2022-09-13 NOTE — Therapy (Signed)
OUTPATIENT PHYSICAL THERAPY UPPER EXTREMITY EVALUATION   Patient Name: Gary Hayes MRN: [id number] DOB:02-13-1976, 46 y.o., male Today's Date: 09/13/2022  END OF SESSION:  PT End of Session - 09/13/22 0831     Visit Number 2    Number of Visits 25    Date for PT Re-Evaluation 12/07/22    Authorization Type U[employer]    PT Start Time 0803    PT Stop Time 0830    PT Time Calculation (min) 27 min    Activity Tolerance Patient tolerated treatment well    Behavior During Therapy [facility] for tasks assessed/performed              Past Medical History:  Diagnosis Date   Arthritis    bilateral shoulders   Chicken pox    Frequent headaches    Hay fever    Past Surgical History:  Procedure Laterality Date   SHOULDER ARTHROSCOPY WITH LABRAL REPAIR Left 11/15/2021   Procedure: left SHOULDER ARTHROSCOPY WITH LABRAL REPAIR;  Surgeon: [name], MD;  Location: [facility] OR;  Service: Orthopedics;  Laterality: Left;   SHOULDER ARTHROSCOPY WITH LABRAL REPAIR Right 09/05/2022   Procedure: RIGHT SHOULDER ARTHROSCOPY WITH LABRAL REPAIR;  Surgeon: [name], MD;  Location: [facility];  Service: Orthopedics;  Laterality: Right;   WISDOM TOOTH EXTRACTION     Patient Active Problem List   Diagnosis Date Noted   Degenerative superior labral anterior-to-posterior (SLAP) tear of right shoulder    Plantar fasciitis 12/20/2018   Tightness of right gastrocnemius muscle 12/20/2018   Shoulder separation 12/20/2018    PCP: Deeann Saint, MD   REFERRING PROVIDER:   [name], MD    REFERRING DIAG:  Diagnosis  [id number] (ICD-10-CM) - Labral tear of shoulder, right, initial encounter    THERAPY DIAG:  Acute pain of right shoulder  Stiffness of right shoulder, not elsewhere classified  Localized edema  Rationale for Evaluation and Treatment: Rehabilitation  ONSET DATE: 09/05/22 DOS  Days since surgery: 8   1.  Right shoulder inferior labral repair 2.   Right shoulder humeral head abrasion arthroplasty 3.  Right shoulder extensive debridement (humeral head, anterior, inferior, posterior labrum)    SUBJECTIVE:                                                                                                                                                                                      SUBJECTIVE STATEMENT: Pt reports no pain or issues with the R shoulder. No complaints noted today.   PERTINENT HISTORY: L labral repair  PAIN:  Are you having pain? no: NPRS scale: 0/10 Pain location: surgical incision sites  Pain description: sharp/aching Aggravating factors: movement Relieving factors: n/a  PRECAUTIONS: Shoulder   WEIGHT BEARING RESTRICTIONS Yes labral repair   FALLS:  Has patient fallen in last 6 months? No Number of falls: 0   LIVING ENVIRONMENT: Lives with: lives with their family and lives with their spouse     OCCUPATION: Art gallery manager, mostly at a computer   PLOF: Independent   PATIENT GOALS exercise, sports- basketball, running, baseball  OBJECTIVE:   DIAGNOSTIC FINDINGS:   IMPRESSION: 1. Moderate right glenohumeral joint osteoarthritis. 2. Superior labral degeneration without a well-defined tear. 3. Mild infraspinatus greater than supraspinatus tendinosis. No rotator cuff tear. 4. Mild intra-articular biceps tendinosis. 5. Moderate arthropathy of the AC joint.    PATIENT SURVEYS:  FOTO 29 68 at DC 23 pts [facility]    TODAY'S TREATMENT:                                                                                                                                         DATE: 09/13/22 removal of previous bandages, sanitizing of surgical area, fresh gauze and Tegaderm placed, Xeroform bandages were saturated and removed  PROM R GHJ flexion to 80, ABD to 55, ER 5, IR 35 R Elbow PROM full ext and flexion No pain noted  Pendulums- flex, ext, horizontal, circular  PATIENT EDUCATION: Education details:  anatomy, exercise progression, HEP, POC Person educated: Patient Education method: Explanation, Demonstration, Tactile cues, Verbal cues, and Handouts Education comprehension: verbalized understanding and returned demonstration  HOME EXERCISE PROGRAM:  Access Code: [email or web address] URL: [email or web address] Date: 09/08/2022 Prepared by: Zebedee Iba  ASSESSMENT:  CLINICAL IMPRESSION: Patient now 1 week postop.  Patient with good progression of passive range of motion at today's session.  Patient near protocol limits at this time.  No pain noted with passive passive range of motion other than tightness at endrange flexion.  No's significant overpressure applied today.  Pendulums added for home exercise program.  Patient without complaints and no signs and symptoms of infection with bandage change.  Plan to continue per protocol. Consider light isometrics at next.  Patient will benefit from continued skilled therapy in order to progress right upper extremity range of motion and strength for full return to prior level of function.   OBJECTIVE IMPAIRMENTS: decreased ROM, decreased strength, hypomobility, increased edema, increased fascial restrictions, increased muscle spasms, impaired flexibility, impaired sensation, impaired UE functional use, improper body mechanics, postural dysfunction, and pain.   ACTIVITY LIMITATIONS: carrying, lifting, sleeping, transfers, bed mobility, bathing, dressing, reach over head, and hygiene/grooming  PARTICIPATION LIMITATIONS: meal prep, cleaning, laundry, driving, shopping, community activity, occupation, and exercise  PERSONAL FACTORS: Time since onset of injury/illness/exacerbation and 1 comorbidity:    are also affecting patient's functional outcome.   REHAB POTENTIAL: Good  CLINICAL DECISION MAKING: Stable/uncomplicated  EVALUATION COMPLEXITY: Low  GOALS:   SHORT TERM GOALS: Target date: 10/20/2022    Pt will become independent  with HEP  in order to demonstrate synthesis of PT education. Baseline: Goal status: INITIAL  2.  Pt will be able to demonstrate PROM flexion to 140 and ABD to 80 in order to demonstrate functional improvement in UE ROM for progression to next phase of rehab.  Baseline:  Goal status: INITIAL  3.  Pt will score at least 23 pt increase on FOTO to demonstrate functional improvement in MCII and pt perceived function.   Baseline:  Goal status: INITIAL    LONG TERM GOALS: Target date: 12/01/2022   Pt  will become independent with final HEP in order to demonstrate synthesis of PT education.  Baseline:  Goal status: INITIAL  2.  Pt will score >/= 68 on FOTO to demonstrate improvement in perceived right upper extremity function.  Baseline:  Goal status: INITIAL  3.  Pt will be able to demonstrate full AROM in order to demonstrate functional improvement in UE function for self-care and house hold duties.  Baseline:  Goal status: INITIAL  4.  Pt will be able to demonstrate ability to lift >/= 3lbs in order to demonstrate functional improvement in UE function for self-care and house hold duties.  Baseline:  Goal status: INITIAL  5.  Pt will be able to demonstrate ability to being light to moderate resistance training in order to demonstrate functional improvement in UE function for progression towards exercise.  Baseline:  Goal status: INITIAL   PLAN: PT FREQUENCY: 1-2x/week  PT DURATION: 12 weeks  PLANNED INTERVENTIONS: Therapeutic exercises, Therapeutic activity, Neuromuscular re-education, Balance training, Gait training, Patient/Family education, Self Care, Joint mobilization, Joint manipulation, Orthotic/Fit training, DME instructions, Aquatic Therapy, Dry Needling, Electrical stimulation, Spinal manipulation, Spinal mobilization, Cryotherapy, Moist heat, Manual lymph drainage, scar mobilization, Taping, Vasopneumatic device, Traction, Ultrasound, Ionotophoresis 4mg /ml Dexamethasone, Manual  therapy, and Re-evaluation  PLAN FOR NEXT SESSION: Passive range of motion per protocol; shoulder instability protocol   , PT 09/13/2022, 8:39 AM

## 2022-09-18 ENCOUNTER — Ambulatory Visit (INDEPENDENT_AMBULATORY_CARE_PROVIDER_SITE_OTHER): Payer: 59 | Admitting: Orthopaedic Surgery

## 2022-09-18 DIAGNOSIS — S43431A Superior glenoid labrum lesion of right shoulder, initial encounter: Secondary | ICD-10-CM

## 2022-09-18 NOTE — Progress Notes (Signed)
Post Operative Evaluation    Procedure/Date of Surgery: Right shoulder arthroscopic labral repair and humeral head abrasion arthroplasty 11/14  Interval History:     Presents today 2 weeks status post above procedure.  He has begun physical therapy at this point.  He denies any significant pain in the right shoulder.  He is not needing any narcotic pain medication.  He is compliant with aspirin usage.   PMH/PSH/Family History/Social History/Meds/Allergies:    Past Medical History:  Diagnosis Date   Arthritis    bilateral shoulders   Chicken pox    Frequent headaches    Hay fever    Past Surgical History:  Procedure Laterality Date   SHOULDER ARTHROSCOPY WITH LABRAL REPAIR Left 11/15/2021   Procedure: left SHOULDER ARTHROSCOPY WITH LABRAL REPAIR;  Surgeon: [name], MD;  Location: [facility];  Service: Orthopedics;  Laterality: Left;   SHOULDER ARTHROSCOPY WITH LABRAL REPAIR Right 09/05/2022   Procedure: RIGHT SHOULDER ARTHROSCOPY WITH LABRAL REPAIR;  Surgeon: [name], MD;  Location: [facility];  Service: Orthopedics;  Laterality: Right;   WISDOM TOOTH EXTRACTION     Social History   Socioeconomic History   Marital status: Married    Spouse name: Not on file   Number of children: Not on file   Years of education: Not on file   Highest education level: Bachelor's degree (e.g., BA, AB, BS)  Occupational History   Not on file  Tobacco Use   Smoking status: Never   Smokeless tobacco: Never  Vaping Use   Vaping Use: Never used  Substance and Sexual Activity   Alcohol use: Never    Alcohol/week: 0.0 standard drinks of alcohol   Drug use: Never   Sexual activity: Not on file  Other Topics Concern   Not on file  Social History Narrative   Not on file   Social Determinants of Health   Financial Resource Strain: Low Risk  (09/05/2021)   Overall Financial Resource Strain (CARDIA)    Difficulty of Paying Living  Expenses: Not hard at all  Food Insecurity: No Food Insecurity (09/05/2021)   Hunger Vital Sign    Worried About Running Out of Food in the Last Year: Never true    Ran Out of Food in the Last Year: Never true  Transportation Needs: No Transportation Needs (09/05/2021)   PRAPARE - Administrator, Civil Service (Medical): No    Lack of Transportation (Non-Medical): No  Physical Activity: Insufficiently Active (09/05/2021)   Exercise Vital Sign    Days of Exercise per Week: 2 days    Minutes of Exercise per Session: 30 min  Stress: No Stress Concern Present (09/05/2021)   [employer] of Occupational Health - Occupational Stress Questionnaire    Feeling of Stress : Only a little  Social Connections: Unknown (09/05/2021)   Social Connection and Isolation Panel [NHANES]    Frequency of Communication with Friends and Family: Patient refused    Frequency of Social Gatherings with Friends and Family: Patient refused    Attends Religious Services: Patient refused    Database administrator or Organizations: No    Attends Engineer, structural: Not on file    Marital Status: Married   Family History  Problem Relation Age of Onset   Diabetes Mother  Cancer Father    Heart disease Paternal Grandmother    No Known Allergies Current Outpatient Medications  Medication Sig Dispense Refill   aspirin EC 325 MG tablet Take 1 tablet (325 mg total) by mouth daily. 30 tablet 0   cholecalciferol (VITAMIN D3) 25 MCG (1000 UNIT) tablet Take 2,000 Units by mouth daily.     loratadine (CLARITIN) 10 MG tablet Take 10 mg by mouth daily.     oxyCODONE (OXY IR/ROXICODONE) 5 MG immediate release tablet Take 1 tablet (5 mg total) by mouth every 4 (four) hours as needed (severe pain). 20 tablet 0   Pseudoephedrine HCl (SUDAFED SINUS CONGESTION 24HR) 240 MG TB24 Take 240 mg by mouth daily as needed (congestion).     No current facility-administered medications for this visit.   No  results found.  Review of Systems:   A ROS was performed including pertinent positives and negatives as documented in the HPI.   Musculoskeletal Exam:    There were no vitals taken for this visit.  Right shoulder incisions are well-appearing without erythema or drainage.  Able to flex and extend at the right elbow.  The spine position is able to forward elevate to 90 degrees passively with external rotation of the side to 30 degrees.  Internal rotation deferred today.  Remainder of distal neurosensory exam is intact  Imaging:      I personally reviewed and interpreted the radiographs.   Assessment:   2 weeks status post right shoulder arthroscopic labral repair overall doing very well.  We will continue to advance according to the arthroscopic rehab protocol.  I will plan to see him back in 4 weeks for reassessment  Plan :    -Return to clinic in 4 weeks for reassessment      I personally saw and evaluated the patient, and participated in the management and treatment plan.  [name], MD Attending Physician, Orthopedic Surgery  This document was dictated using Dragon voice recognition software. A reasonable attempt at proof reading has been made to minimize errors.
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
## 2022-09-22 ENCOUNTER — Encounter (HOSPITAL_BASED_OUTPATIENT_CLINIC_OR_DEPARTMENT_OTHER): Payer: Self-pay | Admitting: Physical Therapy

## 2022-09-22 ENCOUNTER — Ambulatory Visit (HOSPITAL_BASED_OUTPATIENT_CLINIC_OR_DEPARTMENT_OTHER): Payer: 59 | Attending: Orthopaedic Surgery | Admitting: Physical Therapy

## 2022-09-22 DIAGNOSIS — M25611 Stiffness of right shoulder, not elsewhere classified: Secondary | ICD-10-CM | POA: Diagnosis present

## 2022-09-22 DIAGNOSIS — M25512 Pain in left shoulder: Secondary | ICD-10-CM | POA: Diagnosis present

## 2022-09-22 DIAGNOSIS — R600 Localized edema: Secondary | ICD-10-CM | POA: Diagnosis present

## 2022-09-22 DIAGNOSIS — M25511 Pain in right shoulder: Secondary | ICD-10-CM

## 2022-09-22 DIAGNOSIS — M25612 Stiffness of left shoulder, not elsewhere classified: Secondary | ICD-10-CM | POA: Diagnosis present

## 2022-09-29 ENCOUNTER — Ambulatory Visit (HOSPITAL_BASED_OUTPATIENT_CLINIC_OR_DEPARTMENT_OTHER): Payer: 59 | Admitting: Physical Therapy

## 2022-09-29 ENCOUNTER — Encounter (HOSPITAL_BASED_OUTPATIENT_CLINIC_OR_DEPARTMENT_OTHER): Payer: Self-pay | Admitting: Physical Therapy

## 2022-09-29 DIAGNOSIS — R600 Localized edema: Secondary | ICD-10-CM

## 2022-09-29 DIAGNOSIS — M25511 Pain in right shoulder: Secondary | ICD-10-CM | POA: Diagnosis not present

## 2022-09-29 DIAGNOSIS — M25611 Stiffness of right shoulder, not elsewhere classified: Secondary | ICD-10-CM

## 2022-09-29 NOTE — Therapy (Signed)
OUTPATIENT PHYSICAL THERAPY UPPER EXTREMITY EVALUATION   Patient Name: Gary Hayes MRN: [id number] DOB:20-Feb-1976, 46 y.o., male Today's Date: 09/29/2022  END OF SESSION:  PT End of Session - 09/29/22 1147     Visit Number 4    Number of Visits 25    Date for PT Re-Evaluation 12/07/22    Authorization Type [employer]    PT Start Time 1145    PT Stop Time 1215    PT Time Calculation (min) 30 min    Activity Tolerance Patient tolerated treatment well    Behavior During Therapy WFL for tasks assessed/performed               Past Medical History:  Diagnosis Date   Arthritis    bilateral shoulders   Chicken pox    Frequent headaches    Hay fever    Past Surgical History:  Procedure Laterality Date   SHOULDER ARTHROSCOPY WITH LABRAL REPAIR Left 11/15/2021   Procedure: left SHOULDER ARTHROSCOPY WITH LABRAL REPAIR;  Surgeon: Vanetta Mulders, MD;  Location: [place];  Service: Orthopedics;  Laterality: Left;   SHOULDER ARTHROSCOPY WITH LABRAL REPAIR Right 09/05/2022   Procedure: RIGHT SHOULDER ARTHROSCOPY WITH LABRAL REPAIR;  Surgeon: Vanetta Mulders, MD;  Location: [place];  Service: Orthopedics;  Laterality: Right;   WISDOM TOOTH EXTRACTION     Patient Active Problem List   Diagnosis Date Noted   Degenerative superior labral anterior-to-posterior (SLAP) tear of right shoulder    Plantar fasciitis 12/20/2018   Tightness of right gastrocnemius muscle 12/20/2018   Shoulder separation 12/20/2018    PCP: Billie Ruddy, MD   REFERRING PROVIDER:   Vanetta Mulders, MD    REFERRING DIAG:  Diagnosis  [phone] (ICD-10-CM) - Labral tear of shoulder, right, initial encounter    THERAPY DIAG:  Acute pain of right shoulder  Stiffness of right shoulder, not elsewhere classified  Localized edema  Rationale for Evaluation and Treatment: Rehabilitation  ONSET DATE: 09/05/22 DOS  Days since surgery: 24   1.  Right shoulder inferior labral repair 2.   Right shoulder humeral head abrasion arthroplasty 3.  Right shoulder extensive debridement (humeral head, anterior, inferior, posterior labrum)    SUBJECTIVE:                                                                                                                                                                                      SUBJECTIVE STATEMENT: Doing okay.   PERTINENT HISTORY: L labral repair  PAIN:  Are you having pain? no: NPRS scale: 0/10 Pain location: surgical incision sites  Pain description: sharp/aching Aggravating factors: movement Relieving factors: n/a  PRECAUTIONS:  Shoulder   WEIGHT BEARING RESTRICTIONS Yes labral repair   FALLS:  Has patient fallen in last 6 months? No Number of falls: 0   LIVING ENVIRONMENT: Lives with: lives with their family and lives with their spouse     OCCUPATION: Art gallery manager, mostly at a computer   PLOF: Independent   PATIENT GOALS exercise, sports- basketball, running, baseball  OBJECTIVE:   DIAGNOSTIC FINDINGS:   IMPRESSION: 1. Moderate right glenohumeral joint osteoarthritis. 2. Superior labral degeneration without a well-defined tear. 3. Mild infraspinatus greater than supraspinatus tendinosis. No rotator cuff tear. 4. Mild intra-articular biceps tendinosis. 5. Moderate arthropathy of the AC joint.    PATIENT SURVEYS:  FOTO 29 68 at DC 23 pts MCII    TODAY'S TREATMENT:                                                                                                                                          Treatment                            09/29/22:  PROM per limits; STM levator scap & upper trap; AP GHJ mobs at neutral -30 abd Supine AAROM chest press Seated AROM: ER, flexion short lever arm, biceps curl Standing bent over triceps ext   Treatment                            09/22/22:  Prom in protocol limits Isometric ext, abd, ER, flexion Pulleys flexion & scaption to 90 STM to bil upper trap &  levator, suboccipital realease, upper trap stretching   PATIENT EDUCATION: Education details: anatomy, exercise progression, HEP, POC Person educated: Patient Education method: Explanation, Demonstration, Tactile cues, Verbal cues, and Handouts Education comprehension: verbalized understanding and returned demonstration  HOME EXERCISE PROGRAM:  Access Code: [email or web address] URL: [email or web address]   ASSESSMENT:  CLINICAL IMPRESSION: No concerns. Progressing well through protocol.  Added active assisted and active range of motion exercises to do as tolerated throughout the day.  Encouraged continued use of ice and rest.  Some discomfort in standing triceps extension as he pulled past neutral and was cued to bring her arm forward slightly.   OBJECTIVE IMPAIRMENTS: decreased ROM, decreased strength, hypomobility, increased edema, increased fascial restrictions, increased muscle spasms, impaired flexibility, impaired sensation, impaired UE functional use, improper body mechanics, postural dysfunction, and pain.   ACTIVITY LIMITATIONS: carrying, lifting, sleeping, transfers, bed mobility, bathing, dressing, reach over head, and hygiene/grooming  PARTICIPATION LIMITATIONS: meal prep, cleaning, laundry, driving, shopping, community activity, occupation, and exercise  PERSONAL FACTORS: Time since onset of injury/illness/exacerbation and 1 comorbidity:    are also affecting patient's functional outcome.   REHAB POTENTIAL: Good  CLINICAL DECISION MAKING: Stable/uncomplicated  EVALUATION COMPLEXITY: Low  GOALS:   SHORT TERM GOALS: Target date: 10/20/2022    Pt will  become independent with HEP in order to demonstrate synthesis of PT education. Baseline: Goal status: INITIAL  2.  Pt will be able to demonstrate PROM flexion to 140 and ABD to 80 in order to demonstrate functional improvement in UE ROM for progression to next phase of rehab.  Baseline:  Goal status:  INITIAL  3.  Pt will score at least 23 pt increase on FOTO to demonstrate functional improvement in MCII and pt perceived function.   Baseline:  Goal status: INITIAL    LONG TERM GOALS: Target date: 12/01/2022   Pt  will become independent with final HEP in order to demonstrate synthesis of PT education.  Baseline:  Goal status: INITIAL  2.  Pt will score >/= 68 on FOTO to demonstrate improvement in perceived right upper extremity function.  Baseline:  Goal status: INITIAL  3.  Pt will be able to demonstrate full AROM in order to demonstrate functional improvement in UE function for self-care and house hold duties.  Baseline:  Goal status: INITIAL  4.  Pt will be able to demonstrate ability to lift >/= 3lbs in order to demonstrate functional improvement in UE function for self-care and house hold duties.  Baseline:  Goal status: INITIAL  5.  Pt will be able to demonstrate ability to being light to moderate resistance training in order to demonstrate functional improvement in UE function for progression towards exercise.  Baseline:  Goal status: INITIAL   PLAN: PT FREQUENCY: 1-2x/week  PT DURATION: 12 weeks  PLANNED INTERVENTIONS: Therapeutic exercises, Therapeutic activity, Neuromuscular re-education, Balance training, Gait training, Patient/Family education, Self Care, Joint mobilization, Joint manipulation, Orthotic/Fit training, DME instructions, Aquatic Therapy, Dry Needling, Electrical stimulation, Spinal manipulation, Spinal mobilization, Cryotherapy, Moist heat, Manual lymph drainage, scar mobilization, Taping, Vasopneumatic device, Traction, Ultrasound, Ionotophoresis 4mg /ml Dexamethasone, Manual therapy, and Re-evaluation  PLAN FOR NEXT SESSION: Passive range of motion per protocol; shoulder instability protocol   Tu Bayle C. Bandon Sherwin PT, DPT 09/29/22 12:16 PM

## 2022-10-04 ENCOUNTER — Ambulatory Visit (HOSPITAL_BASED_OUTPATIENT_CLINIC_OR_DEPARTMENT_OTHER): Payer: 59 | Admitting: Physical Therapy

## 2022-10-09 ENCOUNTER — Encounter (HOSPITAL_BASED_OUTPATIENT_CLINIC_OR_DEPARTMENT_OTHER): Payer: Self-pay | Admitting: Physical Therapy

## 2022-10-09 ENCOUNTER — Ambulatory Visit (HOSPITAL_BASED_OUTPATIENT_CLINIC_OR_DEPARTMENT_OTHER): Payer: 59 | Admitting: Physical Therapy

## 2022-10-09 DIAGNOSIS — M25512 Pain in left shoulder: Secondary | ICD-10-CM

## 2022-10-09 DIAGNOSIS — M25511 Pain in right shoulder: Secondary | ICD-10-CM | POA: Diagnosis not present

## 2022-10-09 DIAGNOSIS — M25612 Stiffness of left shoulder, not elsewhere classified: Secondary | ICD-10-CM

## 2022-10-09 DIAGNOSIS — M25611 Stiffness of right shoulder, not elsewhere classified: Secondary | ICD-10-CM

## 2022-10-09 DIAGNOSIS — R600 Localized edema: Secondary | ICD-10-CM

## 2022-10-09 NOTE — Therapy (Signed)
OUTPATIENT PHYSICAL THERAPY UPPER EXTREMITY EVALUATION   Patient Name: Gary Hayes MRN: [id number] DOB:20-Feb-1976, 46 y.o., male Today's Date: 09/29/2022  END OF SESSION:  PT End of Session - 09/29/22 1147     Visit Number 4    Number of Visits 25    Date for PT Re-Evaluation 12/07/22    Authorization Type [employer]    PT Start Time 1145    PT Stop Time 1215    PT Time Calculation (min) 30 min    Activity Tolerance Patient tolerated treatment well    Behavior During Therapy WFL for tasks assessed/performed               Past Medical History:  Diagnosis Date   Arthritis    bilateral shoulders   Chicken pox    Frequent headaches    Hay fever    Past Surgical History:  Procedure Laterality Date   SHOULDER ARTHROSCOPY WITH LABRAL REPAIR Left 11/15/2021   Procedure: left SHOULDER ARTHROSCOPY WITH LABRAL REPAIR;  Surgeon: Vanetta Mulders, MD;  Location: [place];  Service: Orthopedics;  Laterality: Left;   SHOULDER ARTHROSCOPY WITH LABRAL REPAIR Right 09/05/2022   Procedure: RIGHT SHOULDER ARTHROSCOPY WITH LABRAL REPAIR;  Surgeon: Vanetta Mulders, MD;  Location: [place];  Service: Orthopedics;  Laterality: Right;   WISDOM TOOTH EXTRACTION     Patient Active Problem List   Diagnosis Date Noted   Degenerative superior labral anterior-to-posterior (SLAP) tear of right shoulder    Plantar fasciitis 12/20/2018   Tightness of right gastrocnemius muscle 12/20/2018   Shoulder separation 12/20/2018    PCP: Billie Ruddy, MD   REFERRING PROVIDER:   Vanetta Mulders, MD    REFERRING DIAG:  Diagnosis  [phone] (ICD-10-CM) - Labral tear of shoulder, right, initial encounter    THERAPY DIAG:  Acute pain of right shoulder  Stiffness of right shoulder, not elsewhere classified  Localized edema  Rationale for Evaluation and Treatment: Rehabilitation  ONSET DATE: 09/05/22 DOS  Days since surgery: 24   1.  Right shoulder inferior labral repair 2.   Right shoulder humeral head abrasion arthroplasty 3.  Right shoulder extensive debridement (humeral head, anterior, inferior, posterior labrum)    SUBJECTIVE:                                                                                                                                                                                      SUBJECTIVE STATEMENT: Doing okay.   PERTINENT HISTORY: L labral repair  PAIN:  Are you having pain? no: NPRS scale: 0/10 Pain location: surgical incision sites  Pain description: sharp/aching Aggravating factors: movement Relieving factors: n/a  PRECAUTIONS:  Shoulder   WEIGHT BEARING RESTRICTIONS Yes labral repair   FALLS:  Has patient fallen in last 6 months? No Number of falls: 0   LIVING ENVIRONMENT: Lives with: lives with their family and lives with their spouse     OCCUPATION: Art gallery manager, mostly at a computer   PLOF: Independent   PATIENT GOALS exercise, sports- basketball, running, baseball  OBJECTIVE:   DIAGNOSTIC FINDINGS:   IMPRESSION: 1. Moderate right glenohumeral joint osteoarthritis. 2. Superior labral degeneration without a well-defined tear. 3. Mild infraspinatus greater than supraspinatus tendinosis. No rotator cuff tear. 4. Mild intra-articular biceps tendinosis. 5. Moderate arthropathy of the AC joint.    PATIENT SURVEYS:  FOTO 29 68 at DC 23 pts MCII    TODAY'S TREATMENT:                                                                                                                                         12/18 PROM per limits; STM levator scap & upper trap; AP GHJ mobs at neutral Supine ER x15  Supine chest press 2x15 1 lbs  Supine wand flexion x15 1 lb  Pulley into flexion 2 min  Prone row 2x15  Prone extension 2x15  Prone horizontal abduction x15     Treatment                            09/29/22:  PROM per limits; STM levator scap & upper trap; AP GHJ mobs at neutral -30 abd Supine AAROM chest  press Seated AROM: ER, flexion short lever arm, biceps curl Standing bent over triceps ext   Treatment                            09/22/22:  Prom in protocol limits Isometric ext, abd, ER, flexion Pulleys flexion & scaption to 90 STM to bil upper trap & levator, suboccipital realease, upper trap stretching   PATIENT EDUCATION: Education details: anatomy, exercise progression, HEP, POC Person educated: Patient Education method: Explanation, Demonstration, Tactile cues, Verbal cues, and Handouts Education comprehension: verbalized understanding and returned demonstration  HOME EXERCISE PROGRAM:  Access Code: [email or web address] URL: [email or web address]   ASSESSMENT:  CLINICAL IMPRESSION: The patient is doing very well. His ROM is progressing within protocol limits. Therapy was able to add scapular strengthening exercises in prone today to his HEP. We will continue to progress as tolerated.   OBJECTIVE IMPAIRMENTS: decreased ROM, decreased strength, hypomobility, increased edema, increased fascial restrictions, increased muscle spasms, impaired flexibility, impaired sensation, impaired UE functional use, improper body mechanics, postural dysfunction, and pain.   ACTIVITY LIMITATIONS: carrying, lifting, sleeping, transfers, bed mobility, bathing, dressing, reach over head, and hygiene/grooming  PARTICIPATION LIMITATIONS: meal prep, cleaning, laundry, driving, shopping, community activity, occupation, and exercise  PERSONAL FACTORS: Time since onset of injury/illness/exacerbation and 1 comorbidity:  are also affecting patient's functional outcome.   REHAB POTENTIAL: Good  CLINICAL DECISION MAKING: Stable/uncomplicated  EVALUATION COMPLEXITY: Low  GOALS:   SHORT TERM GOALS: Target date: 10/20/2022    Pt will become independent with HEP in order to demonstrate synthesis of PT education. Baseline: Goal status: INITIAL  2.  Pt will be able to demonstrate PROM  flexion to 140 and ABD to 80 in order to demonstrate functional improvement in UE ROM for progression to next phase of rehab.  Baseline:  Goal status: INITIAL  3.  Pt will score at least 23 pt increase on FOTO to demonstrate functional improvement in MCII and pt perceived function.   Baseline:  Goal status: INITIAL    LONG TERM GOALS: Target date: 12/01/2022   Pt  will become independent with final HEP in order to demonstrate synthesis of PT education.  Baseline:  Goal status: INITIAL  2.  Pt will score >/= 68 on FOTO to demonstrate improvement in perceived right upper extremity function.  Baseline:  Goal status: INITIAL  3.  Pt will be able to demonstrate full AROM in order to demonstrate functional improvement in UE function for self-care and house hold duties.  Baseline:  Goal status: INITIAL  4.  Pt will be able to demonstrate ability to lift >/= 3lbs in order to demonstrate functional improvement in UE function for self-care and house hold duties.  Baseline:  Goal status: INITIAL  5.  Pt will be able to demonstrate ability to being light to moderate resistance training in order to demonstrate functional improvement in UE function for progression towards exercise.  Baseline:  Goal status: INITIAL   PLAN: PT FREQUENCY: 1-2x/week  PT DURATION: 12 weeks  PLANNED INTERVENTIONS: Therapeutic exercises, Therapeutic activity, Neuromuscular re-education, Balance training, Gait training, Patient/Family education, Self Care, Joint mobilization, Joint manipulation, Orthotic/Fit training, DME instructions, Aquatic Therapy, Dry Needling, Electrical stimulation, Spinal manipulation, Spinal mobilization, Cryotherapy, Moist heat, Manual lymph drainage, scar mobilization, Taping, Vasopneumatic device, Traction, Ultrasound, Ionotophoresis 4mg /ml Dexamethasone, Manual therapy, and Re-evaluation  PLAN FOR NEXT SESSION: Passive range of motion per protocol; shoulder instability  protocol  Carolyne Littles PT DPT  09/29/22 12:16 PM

## 2022-10-11 ENCOUNTER — Encounter (HOSPITAL_BASED_OUTPATIENT_CLINIC_OR_DEPARTMENT_OTHER): Payer: Self-pay | Admitting: Physical Therapy

## 2022-10-11 ENCOUNTER — Ambulatory Visit (HOSPITAL_BASED_OUTPATIENT_CLINIC_OR_DEPARTMENT_OTHER): Payer: 59 | Admitting: Physical Therapy

## 2022-10-11 DIAGNOSIS — M25511 Pain in right shoulder: Secondary | ICD-10-CM

## 2022-10-11 DIAGNOSIS — M25611 Stiffness of right shoulder, not elsewhere classified: Secondary | ICD-10-CM

## 2022-10-11 NOTE — Therapy (Signed)
OUTPATIENT PHYSICAL THERAPY UPPER EXTREMITY EVALUATION   Patient Name: Gary Hayes MRN: [id number] DOB:06/18/1976, 46 y.o., male Today's Date: 10/11/2022  END OF SESSION:  PT End of Session - 10/11/22 0850     Visit Number 6    Number of Visits 25    Date for PT Re-Evaluation 12/07/22    Authorization Type [employer]    PT Start Time 0847    PT Stop Time 0927    PT Time Calculation (min) 40 min    Activity Tolerance Patient tolerated treatment well    Behavior During Therapy [facility] for tasks assessed/performed               Past Medical History:  Diagnosis Date   Arthritis    bilateral shoulders   Chicken pox    Frequent headaches    Hay fever    Past Surgical History:  Procedure Laterality Date   SHOULDER ARTHROSCOPY WITH LABRAL REPAIR Left 11/15/2021   Procedure: left SHOULDER ARTHROSCOPY WITH LABRAL REPAIR;  Surgeon: [name], MD;  Location: [facility];  Service: Orthopedics;  Laterality: Left;   SHOULDER ARTHROSCOPY WITH LABRAL REPAIR Right 09/05/2022   Procedure: RIGHT SHOULDER ARTHROSCOPY WITH LABRAL REPAIR;  Surgeon: [name], MD;  Location: [facility];  Service: Orthopedics;  Laterality: Right;   WISDOM TOOTH EXTRACTION     Patient Active Problem List   Diagnosis Date Noted   Degenerative superior labral anterior-to-posterior (SLAP) tear of right shoulder    Plantar fasciitis 12/20/2018   Tightness of right gastrocnemius muscle 12/20/2018   Shoulder separation 12/20/2018    PCP: Deeann Saint, MD   REFERRING PROVIDER:   [name], MD    REFERRING DIAG:  Diagnosis  [phone] (ICD-10-CM) - Labral tear of shoulder, right, initial encounter    THERAPY DIAG:  Acute pain of right shoulder  Stiffness of right shoulder, not elsewhere classified  Rationale for Evaluation and Treatment: Rehabilitation  ONSET DATE: 09/05/22 DOS  Days since surgery: 36   1.  Right shoulder inferior labral repair 2.  Right shoulder  humeral head abrasion arthroplasty 3.  Right shoulder extensive debridement (humeral head, anterior, inferior, posterior labrum)    SUBJECTIVE:                                                                                                                                                                                      SUBJECTIVE STATEMENT: Doing okay.   PERTINENT HISTORY: L labral repair  PAIN:  Are you having pain? no: NPRS scale: 0/10 Pain location: surgical incision sites  Pain description: sharp/aching Aggravating factors: movement Relieving factors: n/a  PRECAUTIONS: Shoulder  WEIGHT BEARING RESTRICTIONS Yes labral repair   FALLS:  Has patient fallen in last 6 months? No Number of falls: 0   LIVING ENVIRONMENT: Lives with: lives with their family and lives with their spouse     OCCUPATION: Art gallery manager, mostly at a computer   PLOF: Independent   PATIENT GOALS exercise, sports- basketball, running, baseball  OBJECTIVE:   DIAGNOSTIC FINDINGS:   IMPRESSION: 1. Moderate right glenohumeral joint osteoarthritis. 2. Superior labral degeneration without a well-defined tear. 3. Mild infraspinatus greater than supraspinatus tendinosis. No rotator cuff tear. 4. Mild intra-articular biceps tendinosis. 5. Moderate arthropathy of the AC joint.    PATIENT SURVEYS:  FOTO 29 68 at DC 23 pts MCII  10/11/22: 48    TODAY'S TREATMENT:                                                                                                                                          Treatment                            10/11/22:  PROM with STM to upper trap & subscap; AP mobs through abd With 1lb bar: chest press, flx/ext bw 45-120 deg, protraction at 90 deg AROM against gravity in mirror Pulleys flexion Ended session with passivestretching   12/18 PROM per limits; STM levator scap & upper trap; AP GHJ mobs at neutral Supine ER x15  Supine chest press 2x15 1 lbs  Supine  wand flexion x15 1 lb  Pulley into flexion 2 min  Prone row 2x15  Prone extension 2x15  Prone horizontal abduction x15     Treatment                            09/29/22:  PROM per limits; STM levator scap & upper trap; AP GHJ mobs at neutral -30 abd Supine AAROM chest press Seated AROM: ER, flexion short lever arm, biceps curl Standing bent over triceps ext   Treatment                            09/22/22:  Prom in protocol limits Isometric ext, abd, ER, flexion Pulleys flexion & scaption to 90 STM to bil upper trap & levator, suboccipital realease, upper trap stretching   PATIENT EDUCATION: Education details: anatomy, exercise progression, HEP, POC Person educated: Patient Education method: Explanation, Demonstration, Tactile cues, Verbal cues, and Handouts Education comprehension: verbalized understanding and returned demonstration  HOME EXERCISE PROGRAM:  Access Code: [email or web address] URL: [email or web address]   ASSESSMENT:  CLINICAL IMPRESSION: Meeting all ROM goals. Began and ended session with passive stretching to maintain ROM.   OBJECTIVE IMPAIRMENTS: decreased ROM, decreased strength, hypomobility, increased edema, increased fascial restrictions, increased muscle spasms, impaired flexibility, impaired sensation, impaired UE functional use, improper body  mechanics, postural dysfunction, and pain.   ACTIVITY LIMITATIONS: carrying, lifting, sleeping, transfers, bed mobility, bathing, dressing, reach over head, and hygiene/grooming  PARTICIPATION LIMITATIONS: meal prep, cleaning, laundry, driving, shopping, community activity, occupation, and exercise  PERSONAL FACTORS: Time since onset of injury/illness/exacerbation and 1 comorbidity:    are also affecting patient's functional outcome.     GOALS:   SHORT TERM GOALS: Target date: 10/20/2022    Pt will become independent with HEP in order to demonstrate synthesis of PT education. Baseline: Goal  status: achieved  2.  Pt will be able to demonstrate PROM flexion to 140 and ABD to 80 in order to demonstrate functional improvement in UE ROM for progression to next phase of rehab.  Baseline:  Goal status: achieved  3.  Pt will score at least 23 pt increase on FOTO to demonstrate functional improvement in MCII and pt perceived function.   Baseline:  Goal status: achieved    LONG TERM GOALS: Target date: 12/01/2022   Pt  will become independent with final HEP in order to demonstrate synthesis of PT education.  Baseline:  Goal status: INITIAL  2.  Pt will score >/= 68 on FOTO to demonstrate improvement in perceived right upper extremity function.  Baseline:  Goal status: INITIAL  3.  Pt will be able to demonstrate full AROM in order to demonstrate functional improvement in UE function for self-care and house hold duties.  Baseline:  Goal status: INITIAL  4.  Pt will be able to demonstrate ability to lift >/= 3lbs in order to demonstrate functional improvement in UE function for self-care and house hold duties.  Baseline:  Goal status: INITIAL  5.  Pt will be able to demonstrate ability to being light to moderate resistance training in order to demonstrate functional improvement in UE function for progression towards exercise.  Baseline:  Goal status: INITIAL   PLAN: PT FREQUENCY: 1-2x/week  PT DURATION: 12 weeks  PLANNED INTERVENTIONS: Therapeutic exercises, Therapeutic activity, Neuromuscular re-education, Balance training, Gait training, Patient/Family education, Self Care, Joint mobilization, Joint manipulation, Orthotic/Fit training, DME instructions, Aquatic Therapy, Dry Needling, Electrical stimulation, Spinal manipulation, Spinal mobilization, Cryotherapy, Moist heat, Manual lymph drainage, scar mobilization, Taping, Vasopneumatic device, Traction, Ultrasound, Ionotophoresis 4mg /ml Dexamethasone, Manual therapy, and Re-evaluation  PLAN FOR NEXT SESSION: Passive range  of motion per protocol; shoulder instability protocol  Carolyne Littles PT DPT  10/11/22 9:28 AM

## 2022-10-18 ENCOUNTER — Ambulatory Visit (HOSPITAL_BASED_OUTPATIENT_CLINIC_OR_DEPARTMENT_OTHER): Payer: 59 | Admitting: Physical Therapy

## 2022-10-18 ENCOUNTER — Ambulatory Visit (INDEPENDENT_AMBULATORY_CARE_PROVIDER_SITE_OTHER): Payer: 59 | Admitting: Orthopaedic Surgery

## 2022-10-18 ENCOUNTER — Encounter (HOSPITAL_BASED_OUTPATIENT_CLINIC_OR_DEPARTMENT_OTHER): Payer: Self-pay | Admitting: Physical Therapy

## 2022-10-18 DIAGNOSIS — M25611 Stiffness of right shoulder, not elsewhere classified: Secondary | ICD-10-CM

## 2022-10-18 DIAGNOSIS — M25511 Pain in right shoulder: Secondary | ICD-10-CM | POA: Diagnosis not present

## 2022-10-18 DIAGNOSIS — R600 Localized edema: Secondary | ICD-10-CM

## 2022-10-18 DIAGNOSIS — S43431A Superior glenoid labrum lesion of right shoulder, initial encounter: Secondary | ICD-10-CM

## 2022-10-18 NOTE — Progress Notes (Signed)
Post Operative Evaluation    Procedure/Date of Surgery: Right shoulder arthroscopic labral repair and humeral head abrasion arthroplasty 11/14  Interval History:    Presents today 6 weeks status post the above procedure.  Overall he is doing very well.  Range of motion is improving.  He has been working with physical therapy and has progressed active range of motion.  Denies any pain in the shoulder.  PMH/PSH/Family History/Social History/Meds/Allergies:    Past Medical History:  Diagnosis Date   Arthritis    bilateral shoulders   Chicken pox    Frequent headaches    Hay fever    Past Surgical History:  Procedure Laterality Date   SHOULDER ARTHROSCOPY WITH LABRAL REPAIR Left 11/15/2021   Procedure: left SHOULDER ARTHROSCOPY WITH LABRAL REPAIR;  Surgeon: [name], MD;  Location: [facility];  Service: Orthopedics;  Laterality: Left;   SHOULDER ARTHROSCOPY WITH LABRAL REPAIR Right 09/05/2022   Procedure: RIGHT SHOULDER ARTHROSCOPY WITH LABRAL REPAIR;  Surgeon: [name], MD;  [facility];  Service: Orthopedics;  Laterality: Right;   WISDOM TOOTH EXTRACTION     Social History   Socioeconomic History   Marital status: Married    Spouse name: Not on file   Number of children: Not on file   Years of education: Not on file   Highest education level: Bachelor's degree (e.g., BA, AB, BS)  Occupational History   Not on file  Tobacco Use   Smoking status: Never   Smokeless tobacco: Never  Vaping Use   Vaping Use: Never used  Substance and Sexual Activity   Alcohol use: Never    Alcohol/week: 0.0 standard drinks of alcohol   Drug use: Never   Sexual activity: Not on file  Other Topics Concern   Not on file  Social History Narrative   Not on file   Social Determinants of Health   Financial Resource Strain: Low Risk  (09/05/2021)   Overall Financial Resource Strain (CARDIA)    Difficulty of Paying Living  Expenses: Not hard at all  Food Insecurity: No Food Insecurity (09/05/2021)   Hunger Vital Sign    Worried About Running Out of Food in the Last Year: Never true    Ran Out of Food in the Last Year: Never true  Transportation Needs: No Transportation Needs (09/05/2021)   PRAPARE - Administrator, Civil Service (Medical): No    Lack of Transportation (Non-Medical): No  Physical Activity: Insufficiently Active (09/05/2021)   Exercise Vital Sign    Days of Exercise per Week: 2 days    Minutes of Exercise per Session: 30 min  Stress: No Stress Concern Present (09/05/2021)   [employer] of Occupational Health - Occupational Stress Questionnaire    Feeling of Stress : Only a little  Social Connections: Unknown (09/05/2021)   Social Connection and Isolation Panel [NHANES]    Frequency of Communication with Friends and Family: Patient refused    Frequency of Social Gatherings with Friends and Family: Patient refused    Attends Religious Services: Patient refused    Database administrator or Organizations: No    Attends Engineer, structural: Not on file    Marital Status: Married   Family History  Problem Relation Age of Onset   Diabetes Mother  Cancer Father    Heart disease Paternal Grandmother    No Known Allergies Current Outpatient Medications  Medication Sig Dispense Refill   aspirin EC 325 MG tablet Take 1 tablet (325 mg total) by mouth daily. 30 tablet 0   cholecalciferol (VITAMIN D3) 25 MCG (1000 UNIT) tablet Take 2,000 Units by mouth daily.     loratadine (CLARITIN) 10 MG tablet Take 10 mg by mouth daily.     oxyCODONE (OXY IR/ROXICODONE) 5 MG immediate release tablet Take 1 tablet (5 mg total) by mouth every 4 (four) hours as needed (severe pain). 20 tablet 0   Pseudoephedrine HCl (SUDAFED SINUS CONGESTION 24HR) 240 MG TB24 Take 240 mg by mouth daily as needed (congestion).     No current facility-administered medications for this visit.   No  results found.  Review of Systems:   A ROS was performed including pertinent positives and negatives as documented in the HPI.   Musculoskeletal Exam:    There were no vitals taken for this visit.  Right shoulder incisions are well-appearing without erythema or drainage.  Able to flex and extend at the right elbow.  In the standing position he is able to forward elevate to 120 degrees with external rotation at side to 25 degrees.  Internal rotation deferred today.  Remainder of distal neurosensory exam is intact  Imaging:      I personally reviewed and interpreted the radiographs.   Assessment:   6 weeks status post right shoulder arthroscopic labral repair doing well.  At this time he will continue to advance towards active range of motion.  He will continue to follow the labral repair protocol.  All restrictions and limitations were discussed.  He will discontinue his sling at this time.  I will see him back in 6 weeks for reassessment  Plan :    -Return to clinic in 6 weeks for reassessment      I personally saw and evaluated the patient, and participated in the management and treatment plan.  [name], MD Attending Physician, Orthopedic Surgery  This document was dictated using Dragon voice recognition software. A reasonable attempt at proof reading has been made to minimize errors.

## 2022-10-18 NOTE — Therapy (Signed)
OUTPATIENT PHYSICAL THERAPY UPPER EXTREMITY EVALUATION   Patient Name: Gary Hayes MRN: [id number] DOB:1976/10/01, 46 y.o., male Today's Date: 10/18/2022  END OF SESSION:  PT End of Session - 10/18/22 0850     Visit Number 7    Number of Visits 25    Date for PT Re-Evaluation 12/07/22    Authorization Type UHC    PT Start Time 0848    PT Stop Time 0929    PT Time Calculation (min) 41 min    Activity Tolerance Patient tolerated treatment well    Behavior During Therapy [facility] for tasks assessed/performed                Past Medical History:  Diagnosis Date   Arthritis    bilateral shoulders   Chicken pox    Frequent headaches    Hay fever    Past Surgical History:  Procedure Laterality Date   SHOULDER ARTHROSCOPY WITH LABRAL REPAIR Left 11/15/2021   Procedure: left SHOULDER ARTHROSCOPY WITH LABRAL REPAIR;  Surgeon: [name], MD;  Location: [facility];  Service: Orthopedics;  Laterality: Left;   SHOULDER ARTHROSCOPY WITH LABRAL REPAIR Right 09/05/2022   Procedure: RIGHT SHOULDER ARTHROSCOPY WITH LABRAL REPAIR;  Surgeon: [name], MD;  Location: [facility];  Service: Orthopedics;  Laterality: Right;   WISDOM TOOTH EXTRACTION     Patient Active Problem List   Diagnosis Date Noted   Degenerative superior labral anterior-to-posterior (SLAP) tear of right shoulder    Plantar fasciitis 12/20/2018   Tightness of right gastrocnemius muscle 12/20/2018   Shoulder separation 12/20/2018    PCP: Deeann Saint, MD   REFERRING PROVIDER:   [name], MD    REFERRING DIAG:  Diagnosis  [phone] (ICD-10-CM) - Labral tear of shoulder, right, initial encounter    THERAPY DIAG:  Acute pain of right shoulder  Stiffness of right shoulder, not elsewhere classified  Localized edema  Rationale for Evaluation and Treatment: Rehabilitation  ONSET DATE: 09/05/22 DOS  Days since surgery: 43   1.  Right shoulder inferior labral repair 2.   Right shoulder humeral head abrasion arthroplasty 3.  Right shoulder extensive debridement (humeral head, anterior, inferior, posterior labrum)    SUBJECTIVE:                                                                                                                                                                                      SUBJECTIVE STATEMENT: Doing okay.   PERTINENT HISTORY: L labral repair  PAIN:  Are you having pain? no: NPRS scale: 0/10 Pain location: surgical incision sites  Pain description: sharp/aching Aggravating factors: movement Relieving factors: n/a  PRECAUTIONS: Shoulder   WEIGHT BEARING RESTRICTIONS Yes labral repair   FALLS:  Has patient fallen in last 6 months? No Number of falls: 0   LIVING ENVIRONMENT: Lives with: lives with their family and lives with their spouse     OCCUPATION: Art gallery manager, mostly at a computer   PLOF: Independent   PATIENT GOALS exercise, sports- basketball, running, baseball  OBJECTIVE:   DIAGNOSTIC FINDINGS:   IMPRESSION: 1. Moderate right glenohumeral joint osteoarthritis. 2. Superior labral degeneration without a well-defined tear. 3. Mild infraspinatus greater than supraspinatus tendinosis. No rotator cuff tear. 4. Mild intra-articular biceps tendinosis. 5. Moderate arthropathy of the AC joint.    PATIENT SURVEYS:  FOTO 29 68 at DC 23 pts MCII  10/11/22: 47    TODAY'S TREATMENT:                                                                                                                                          12/27 Manual: trigger point release to upper trap and pec Chest press 2lb bar  Wand flexion 1lb bar through full range 2x10  Side lying ER 2x10  Flexion in the mirror 2x10  Row 2x10 yellow  Shoulder extension 2x10 yellow. Pulley into flexion 2 min     Treatment                            10/11/22:  PROM with STM to upper trap & subscap; AP mobs through abd With 1lb bar: chest  press, flx/ext bw 45-120 deg, protraction at 90 deg AROM against gravity in mirror Pulleys flexion Ended session with passivestretching   12/18 PROM per limits; STM levator scap & upper trap; AP GHJ mobs at neutral Supine ER x15  Supine chest press 2x15 1 lbs  Supine wand flexion x15 1 lb  Pulley into flexion 2 min  Prone row 2x15  Prone extension 2x15  Prone horizontal abduction x15     Treatment                            09/29/22:  PROM per limits; STM levator scap & upper trap; AP GHJ mobs at neutral -30 abd Supine AAROM chest press Seated AROM: ER, flexion short lever arm, biceps curl Standing bent over triceps ext   Treatment                            09/22/22:  Prom in protocol limits Isometric ext, abd, ER, flexion Pulleys flexion & scaption to 90 STM to bil upper trap & levator, suboccipital realease, upper trap stretching   PATIENT EDUCATION: Education details: anatomy, exercise progression, HEP, POC Person educated: Patient Education method: Explanation, Demonstration, Tactile cues, Verbal cues, and Handouts Education comprehension: verbalized understanding and returned demonstration  HOME EXERCISE PROGRAM:  Access Code: [email or web address] URL: [email or web address]   ASSESSMENT:  CLINICAL IMPRESSION: The patient is making great progress. His motion is progressing well. He is at 6 weeks. We were able to progress some of his exercises today> We advanced ER to side lying ER. We also added light resistance to his scapular exercises. He was advised if they give him trouble he can stick with the prone scapular exercises. We will continue to advance as tolerated and per protocol.   OBJECTIVE IMPAIRMENTS: decreased ROM, decreased strength, hypomobility, increased edema, increased fascial restrictions, increased muscle spasms, impaired flexibility, impaired sensation, impaired UE functional use, improper body mechanics, postural dysfunction, and pain.    ACTIVITY LIMITATIONS: carrying, lifting, sleeping, transfers, bed mobility, bathing, dressing, reach over head, and hygiene/grooming  PARTICIPATION LIMITATIONS: meal prep, cleaning, laundry, driving, shopping, community activity, occupation, and exercise  PERSONAL FACTORS: Time since onset of injury/illness/exacerbation and 1 comorbidity:    are also affecting patient's functional outcome.     GOALS:   SHORT TERM GOALS: Target date: 10/20/2022    Pt will become independent with HEP in order to demonstrate synthesis of PT education. Baseline: Goal status: achieved  2.  Pt will be able to demonstrate PROM flexion to 140 and ABD to 80 in order to demonstrate functional improvement in UE ROM for progression to next phase of rehab.  Baseline:  Goal status: achieved  3.  Pt will score at least 23 pt increase on FOTO to demonstrate functional improvement in MCII and pt perceived function.   Baseline:  Goal status: achieved    LONG TERM GOALS: Target date: 12/01/2022   Pt  will become independent with final HEP in order to demonstrate synthesis of PT education.  Baseline:  Goal status: INITIAL  2.  Pt will score >/= 68 on FOTO to demonstrate improvement in perceived right upper extremity function.  Baseline:  Goal status: INITIAL  3.  Pt will be able to demonstrate full AROM in order to demonstrate functional improvement in UE function for self-care and house hold duties.  Baseline:  Goal status: INITIAL  4.  Pt will be able to demonstrate ability to lift >/= 3lbs in order to demonstrate functional improvement in UE function for self-care and house hold duties.  Baseline:  Goal status: INITIAL  5.  Pt will be able to demonstrate ability to being light to moderate resistance training in order to demonstrate functional improvement in UE function for progression towards exercise.  Baseline:  Goal status: INITIAL   PLAN: PT FREQUENCY: 1-2x/week  PT DURATION: 12  weeks  PLANNED INTERVENTIONS: Therapeutic exercises, Therapeutic activity, Neuromuscular re-education, Balance training, Gait training, Patient/Family education, Self Care, Joint mobilization, Joint manipulation, Orthotic/Fit training, DME instructions, Aquatic Therapy, Dry Needling, Electrical stimulation, Spinal manipulation, Spinal mobilization, Cryotherapy, Moist heat, Manual lymph drainage, scar mobilization, Taping, Vasopneumatic device, Traction, Ultrasound, Ionotophoresis 4mg /ml Dexamethasone, Manual therapy, and Re-evaluation  PLAN FOR NEXT SESSION: Passive range of motion per protocol; shoulder instability protocol  PT DPT  10/18/22 9:06 AM

## 2022-10-20 ENCOUNTER — Encounter (HOSPITAL_BASED_OUTPATIENT_CLINIC_OR_DEPARTMENT_OTHER): Payer: 59 | Admitting: Physical Therapy

## 2022-10-25 ENCOUNTER — Ambulatory Visit (HOSPITAL_BASED_OUTPATIENT_CLINIC_OR_DEPARTMENT_OTHER): Payer: 59 | Attending: Orthopaedic Surgery | Admitting: Physical Therapy

## 2022-10-25 ENCOUNTER — Encounter (HOSPITAL_BASED_OUTPATIENT_CLINIC_OR_DEPARTMENT_OTHER): Payer: Self-pay | Admitting: Physical Therapy

## 2022-10-25 DIAGNOSIS — R600 Localized edema: Secondary | ICD-10-CM | POA: Insufficient documentation

## 2022-10-25 DIAGNOSIS — M25611 Stiffness of right shoulder, not elsewhere classified: Secondary | ICD-10-CM | POA: Diagnosis present

## 2022-10-25 DIAGNOSIS — M25511 Pain in right shoulder: Secondary | ICD-10-CM | POA: Diagnosis present

## 2022-10-25 NOTE — Therapy (Signed)
OUTPATIENT PHYSICAL THERAPY UPPER EXTREMITY EVALUATION   Patient Name: Gary Hayes MRN: [id number] DOB:Jul 12, 1976, 47 y.o., male Today's Date: 10/25/2022  END OF SESSION:  PT End of Session - 10/25/22 0925     Visit Number 8    Number of Visits 25    Date for PT Re-Evaluation 12/07/22    Authorization Type UHC    PT Start Time 0900    PT Stop Time 0930    PT Time Calculation (min) 30 min    Activity Tolerance Patient tolerated treatment well    Behavior During Therapy [facility] for tasks assessed/performed                 Past Medical History:  Diagnosis Date   Arthritis    bilateral shoulders   Chicken pox    Frequent headaches    Hay fever    Past Surgical History:  Procedure Laterality Date   SHOULDER ARTHROSCOPY WITH LABRAL REPAIR Left 11/15/2021   Procedure: left SHOULDER ARTHROSCOPY WITH LABRAL REPAIR;  Surgeon: Vanetta Mulders, MD;  Location: [place];  Service: Orthopedics;  Laterality: Left;   SHOULDER ARTHROSCOPY WITH LABRAL REPAIR Right 09/05/2022   Procedure: RIGHT SHOULDER ARTHROSCOPY WITH LABRAL REPAIR;  Surgeon: Vanetta Mulders, MD;  Location: [place];  Service: Orthopedics;  Laterality: Right;   WISDOM TOOTH EXTRACTION     Patient Active Problem List   Diagnosis Date Noted   Degenerative superior labral anterior-to-posterior (SLAP) tear of right shoulder    Plantar fasciitis 12/20/2018   Tightness of right gastrocnemius muscle 12/20/2018   Shoulder separation 12/20/2018    PCP: Billie Ruddy, MD   REFERRING PROVIDER:   Vanetta Mulders, MD    REFERRING DIAG:  Diagnosis  [phone] (ICD-10-CM) - Labral tear of shoulder, right, initial encounter    THERAPY DIAG:  Acute pain of right shoulder  Stiffness of right shoulder, not elsewhere classified  Localized edema  Rationale for Evaluation and Treatment: Rehabilitation  ONSET DATE: 09/05/22 DOS  Days since surgery: 50   1.  Right shoulder inferior labral repair 2.   Right shoulder humeral head abrasion arthroplasty 3.  Right shoulder extensive debridement (humeral head, anterior, inferior, posterior labrum)    SUBJECTIVE:                                                                                                                                                                                      SUBJECTIVE STATEMENT: Pt states the shoulder is doing well. He feels that hte R is progressing faster than the other shoulder. No pain after last session but just still stiff/tight with mild weakness with above 90.  PERTINENT HISTORY: L labral repair  PAIN:  Are you having pain? no: NPRS scale: 0/10 Pain location: surgical incision sites  Pain description: sharp/aching Aggravating factors: movement Relieving factors: n/a  PRECAUTIONS: Shoulder   WEIGHT BEARING RESTRICTIONS Yes labral repair   FALLS:  Has patient fallen in last 6 months? No Number of falls: 0   LIVING ENVIRONMENT: Lives with: lives with their family and lives with their spouse     OCCUPATION: Chief Financial Officer, mostly at a computer   PLOF: Independent   PATIENT GOALS exercise, sports- basketball, running, baseball  OBJECTIVE:   DIAGNOSTIC FINDINGS:   IMPRESSION: 1. Moderate right glenohumeral joint osteoarthritis. 2. Superior labral degeneration without a well-defined tear. 3. Mild infraspinatus greater than supraspinatus tendinosis. No rotator cuff tear. 4. Mild intra-articular biceps tendinosis. 5. Moderate arthropathy of the AC joint.    PATIENT SURVEYS:  FOTO 29 68 at DC 23 pts MCII  10/11/22: 47    TODAY'S TREATMENT:       1/3 Manual: R GHJ inf and post grade III  Side lying ER 2x10 3s S/L ABD 2x10 Bent over T and Y 15x each Row 2x10 yellow  Wall slide 10x each flexion and ABD 2s hold at top Shoulder extension 2x10 yellow. Pulley into flexion and ABD 33min  each                                                                                                                                     12/27 Manual: trigger point release to upper trap and pec Chest press 2lb bar  Wand flexion 1lb bar through full range 2x10  Side lying ER 2x10  Flexion in the mirror 2x10  Row 2x10 yellow  Shoulder extension 2x10 yellow. Pulley into flexion 2 min     Treatment                            10/11/22:  PROM with STM to upper trap & subscap; AP mobs through abd With 1lb bar: chest press, flx/ext bw 45-120 deg, protraction at 90 deg AROM against gravity in mirror Pulleys flexion Ended session with passivestretching   12/18 PROM per limits; STM levator scap & upper trap; AP GHJ mobs at neutral Supine ER x15  Supine chest press 2x15 1 lbs  Supine wand flexion x15 1 lb  Pulley into flexion 2 min  Prone row 2x15  Prone extension 2x15  Prone horizontal abduction x15     Treatment                            09/29/22:  PROM per limits; STM levator scap & upper trap; AP GHJ mobs at neutral -30 abd Supine AAROM chest press Seated AROM: ER, flexion short lever arm, biceps curl Standing bent over triceps ext   Treatment  09/22/22:  Prom in protocol limits Isometric ext, abd, ER, flexion Pulleys flexion & scaption to 90 STM to bil upper trap & levator, suboccipital realease, upper trap stretching   PATIENT EDUCATION: Education details: anatomy, exercise progression, HEP, POC Person educated: Patient Education method: Explanation, Demonstration, Tactile cues, Verbal cues, and Handouts Education comprehension: verbalized understanding and returned demonstration  HOME EXERCISE PROGRAM:  Access Code: [email or web address] URL: [email or web address]   ASSESSMENT:  CLINICAL IMPRESSION: Patient with good tolerance with new exercise throughout session.  Patient able to continue with progression of scapular strength, active range of motion, and stretching with overpressure especially overhead.  Patient still with  frontal plane tightness limitations as expected and difficulty into external rotation.  Plan to continue with manual therapy as tolerated to improve joint stiffness and active range of motion as able.  Patient continues to progress well as per protocol.  Patient will benefit from continued skilled therapy in order to address functional deficits for return to prior level of function exercise  OBJECTIVE IMPAIRMENTS: decreased ROM, decreased strength, hypomobility, increased edema, increased fascial restrictions, increased muscle spasms, impaired flexibility, impaired sensation, impaired UE functional use, improper body mechanics, postural dysfunction, and pain.   ACTIVITY LIMITATIONS: carrying, lifting, sleeping, transfers, bed mobility, bathing, dressing, reach over head, and hygiene/grooming  PARTICIPATION LIMITATIONS: meal prep, cleaning, laundry, driving, shopping, community activity, occupation, and exercise  PERSONAL FACTORS: Time since onset of injury/illness/exacerbation and 1 comorbidity:    are also affecting patient's functional outcome.     GOALS:   SHORT TERM GOALS: Target date: 10/20/2022    Pt will become independent with HEP in order to demonstrate synthesis of PT education. Baseline: Goal status: achieved  2.  Pt will be able to demonstrate PROM flexion to 140 and ABD to 80 in order to demonstrate functional improvement in UE ROM for progression to next phase of rehab.  Baseline:  Goal status: achieved  3.  Pt will score at least 23 pt increase on FOTO to demonstrate functional improvement in MCII and pt perceived function.   Baseline:  Goal status: achieved    LONG TERM GOALS: Target date: 12/01/2022   Pt  will become independent with final HEP in order to demonstrate synthesis of PT education.  Baseline:  Goal status: INITIAL  2.  Pt will score >/= 68 on FOTO to demonstrate improvement in perceived right upper extremity function.  Baseline:  Goal status:  INITIAL  3.  Pt will be able to demonstrate full AROM in order to demonstrate functional improvement in UE function for self-care and house hold duties.  Baseline:  Goal status: INITIAL  4.  Pt will be able to demonstrate ability to lift >/= 3lbs in order to demonstrate functional improvement in UE function for self-care and house hold duties.  Baseline:  Goal status: INITIAL  5.  Pt will be able to demonstrate ability to being light to moderate resistance training in order to demonstrate functional improvement in UE function for progression towards exercise.  Baseline:  Goal status: INITIAL   PLAN: PT FREQUENCY: 1-2x/week  PT DURATION: 12 weeks  PLANNED INTERVENTIONS: Therapeutic exercises, Therapeutic activity, Neuromuscular re-education, Balance training, Gait training, Patient/Family education, Self Care, Joint mobilization, Joint manipulation, Orthotic/Fit training, DME instructions, Aquatic Therapy, Dry Needling, Electrical stimulation, Spinal manipulation, Spinal mobilization, Cryotherapy, Moist heat, Manual lymph drainage, scar mobilization, Taping, Vasopneumatic device, Traction, Ultrasound, Ionotophoresis 4mg /ml Dexamethasone, Manual therapy, and Re-evaluation  PLAN FOR NEXT SESSION: Passive range of motion per protocol; shoulder instability protocol  Zebedee Iba PT, DPT 10/25/22 10:18 AM

## 2022-10-27 ENCOUNTER — Ambulatory Visit (HOSPITAL_BASED_OUTPATIENT_CLINIC_OR_DEPARTMENT_OTHER): Payer: 59 | Admitting: Physical Therapy

## 2022-10-27 ENCOUNTER — Encounter (HOSPITAL_BASED_OUTPATIENT_CLINIC_OR_DEPARTMENT_OTHER): Payer: Self-pay | Admitting: Physical Therapy

## 2022-10-27 DIAGNOSIS — M25511 Pain in right shoulder: Secondary | ICD-10-CM | POA: Diagnosis not present

## 2022-10-27 DIAGNOSIS — M25611 Stiffness of right shoulder, not elsewhere classified: Secondary | ICD-10-CM

## 2022-10-27 DIAGNOSIS — R600 Localized edema: Secondary | ICD-10-CM

## 2022-10-27 NOTE — Therapy (Signed)
OUTPATIENT PHYSICAL THERAPY UPPER EXTREMITY EVALUATION   Patient Name: Gary Hayes MRN: [id number] DOB:17-Feb-1976, 47 y.o., male Today's Date: 10/27/2022  END OF SESSION:  PT End of Session - 10/27/22 0930     Visit Number 9    Number of Visits 25    Date for PT Re-Evaluation 12/07/22    Authorization Type [employer]    PT Start Time 0845    PT Stop Time 0919    PT Time Calculation (min) 34 min    Activity Tolerance Patient tolerated treatment well    Behavior During Therapy WFL for tasks assessed/performed                  Past Medical History:  Diagnosis Date   Arthritis    bilateral shoulders   Chicken pox    Frequent headaches    Hay fever    Past Surgical History:  Procedure Laterality Date   SHOULDER ARTHROSCOPY WITH LABRAL REPAIR Left 11/15/2021   Procedure: left SHOULDER ARTHROSCOPY WITH LABRAL REPAIR;  Surgeon: Vanetta Mulders, MD;  Location: [place];  Service: Orthopedics;  Laterality: Left;   SHOULDER ARTHROSCOPY WITH LABRAL REPAIR Right 09/05/2022   Procedure: RIGHT SHOULDER ARTHROSCOPY WITH LABRAL REPAIR;  Surgeon: Vanetta Mulders, MD;  Location: [place];  Service: Orthopedics;  Laterality: Right;   WISDOM TOOTH EXTRACTION     Patient Active Problem List   Diagnosis Date Noted   Degenerative superior labral anterior-to-posterior (SLAP) tear of right shoulder    Plantar fasciitis 12/20/2018   Tightness of right gastrocnemius muscle 12/20/2018   Shoulder separation 12/20/2018    PCP: Billie Ruddy, MD   REFERRING PROVIDER:   Vanetta Mulders, MD    REFERRING DIAG:  Diagnosis  [phone] (ICD-10-CM) - Labral tear of shoulder, right, initial encounter    THERAPY DIAG:  Acute pain of right shoulder  Stiffness of right shoulder, not elsewhere classified  Localized edema  Rationale for Evaluation and Treatment: Rehabilitation  ONSET DATE: 09/05/22 DOS  Days since surgery: 52   1.  Right shoulder inferior labral  repair 2.  Right shoulder humeral head abrasion arthroplasty 3.  Right shoulder extensive debridement (humeral head, anterior, inferior, posterior labrum)    SUBJECTIVE:                                                                                                                                                                                      SUBJECTIVE STATEMENT: No pain since last session other than with posterior reach and OH position. When the humeral head comes forward, he has pain. Reaching BHB position. Pt is nearing 8 wks today.  PERTINENT HISTORY:  L labral repair  PAIN:  Are you having pain? no: NPRS scale: 0/10 Pain location: surgical incision sites  Pain description: sharp/aching Aggravating factors: movement Relieving factors: n/a  PRECAUTIONS: Shoulder   WEIGHT BEARING RESTRICTIONS Yes labral repair   FALLS:  Has patient fallen in last 6 months? No Number of falls: 0   LIVING ENVIRONMENT: Lives with: lives with their family and lives with their spouse     OCCUPATION: Art gallery manager, mostly at a computer   PLOF: Independent   PATIENT GOALS exercise, sports- basketball, running, baseball  OBJECTIVE:   DIAGNOSTIC FINDINGS:   IMPRESSION: 1. Moderate right glenohumeral joint osteoarthritis. 2. Superior labral degeneration without a well-defined tear. 3. Mild infraspinatus greater than supraspinatus tendinosis. No rotator cuff tear. 4. Mild intra-articular biceps tendinosis. 5. Moderate arthropathy of the AC joint.    PATIENT SURVEYS:  FOTO 29 68 at DC 23 pts MCII  10/11/22: 47    TODAY'S TREATMENT:     1/5 Manual: R GHJ inf and post grade III Shoulder clocks grade III   Star gazer stretch 15s 4x  Supine 2lb protraction 3x10 Side lying ER 2x10 3s Supine horizontal ABD 2x10 S/L ABD 2x10 Bent over T and Y 15x each 1lb Biceps table stretch 30s 3x  1/3 Manual: R GHJ inf and post grade III  Side lying ER 2x10 3s S/L ABD 2x10 Bent over T  and Y 15x each Row 2x10 yellow  Wall slide 10x each flexion and ABD 2s hold at top Shoulder extension 2x10 yellow. Pulley into flexion and ABD  each                                                                                                                                    12/27 Manual: trigger point release to upper trap and pec Chest press 2lb bar  Wand flexion 1lb bar through full range 2x10  Side lying ER 2x10  Flexion in the mirror 2x10  Row 2x10 yellow  Shoulder extension 2x10 yellow. Pulley into flexion 2 min     Treatment                            10/11/22:  PROM with STM to upper trap & subscap; AP mobs through abd With 1lb bar: chest press, flx/ext bw 45-120 deg, protraction at 90 deg AROM against gravity in mirror Pulleys flexion Ended session with passivestretching   12/18 PROM per limits; STM levator scap & upper trap; AP GHJ mobs at neutral Supine ER x15  Supine chest press 2x15 1 lbs  Supine wand flexion x15 1 lb  Pulley into flexion 2 min  Prone row 2x15  Prone extension 2x15  Prone horizontal abduction x15     Treatment  09/29/22:  PROM per limits; STM levator scap & upper trap; AP GHJ mobs at neutral -30 abd Supine AAROM chest press Seated AROM: ER, flexion short lever arm, biceps curl Standing bent over triceps ext   Treatment                            09/22/22:  Prom in protocol limits Isometric ext, abd, ER, flexion Pulleys flexion & scaption to 90 STM to bil upper trap & levator, suboccipital realease, upper trap stretching   PATIENT EDUCATION: Education details: anatomy, exercise progression, HEP, POC Person educated: Patient Education method: Explanation, Demonstration, Tactile cues, Verbal cues, and Handouts Education comprehension: verbalized understanding and returned demonstration  HOME EXERCISE PROGRAM:  Access Code: [email or web address] URL:  [email or web address]   ASSESSMENT:  CLINICAL IMPRESSION: Patient with large improvement in horizontal shoulder abduction at today's session.  Patient able to reach neutral position and allow scapular retraction and humeral abduction.  Patient with mild discomfort into the right elbow and right forearm that is likely due to soft tissue restriction and stiffness of the wrist flexors as well as elbow flexors.  Patient did have relief with some symptoms today with new stretching.  HEP updated accordingly.  Plan to progress strengthening at future sessions and continue with passive endrange stretching in order to reach full overhead positions and behind the back positions.  Plan to continue with manual therapy as tolerated to improve joint stiffness and active range of motion as able.  Patient continues to progress well as per protocol.  Patient will benefit from continued skilled therapy in order to address functional deficits for return to prior level of function exercise  OBJECTIVE IMPAIRMENTS: decreased ROM, decreased strength, hypomobility, increased edema, increased fascial restrictions, increased muscle spasms, impaired flexibility, impaired sensation, impaired UE functional use, improper body mechanics, postural dysfunction, and pain.   ACTIVITY LIMITATIONS: carrying, lifting, sleeping, transfers, bed mobility, bathing, dressing, reach over head, and hygiene/grooming  PARTICIPATION LIMITATIONS: meal prep, cleaning, laundry, driving, shopping, community activity, occupation, and exercise  PERSONAL FACTORS: Time since onset of injury/illness/exacerbation and 1 comorbidity:    are also affecting patient's functional outcome.     GOALS:   SHORT TERM GOALS: Target date: 10/20/2022    Pt will become independent with HEP in order to demonstrate synthesis of PT education. Baseline: Goal status: achieved  2.  Pt will be able to demonstrate PROM flexion to 140 and ABD to 80 in  order to demonstrate functional improvement in UE ROM for progression to next phase of rehab.  Baseline:  Goal status: achieved  3.  Pt will score at least 23 pt increase on FOTO to demonstrate functional improvement in MCII and pt perceived function.   Baseline:  Goal status: achieved    LONG TERM GOALS: Target date: 12/01/2022   Pt  will become independent with final HEP in order to demonstrate synthesis of PT education.  Baseline:  Goal status: INITIAL  2.  Pt will score >/= 68 on FOTO to demonstrate improvement in perceived right upper extremity function.  Baseline:  Goal status: INITIAL  3.  Pt will be able to demonstrate full AROM in order to demonstrate functional improvement in UE function for self-care and house hold duties.  Baseline:  Goal status: INITIAL  4.  Pt will be able to demonstrate ability to lift >/= 3lbs in order to demonstrate functional improvement in UE function for self-care and house hold duties.  Baseline:  Goal status: INITIAL  5.  Pt will be able to demonstrate ability to being light to moderate resistance training in order to demonstrate functional improvement in UE function for progression towards exercise.  Baseline:  Goal status: INITIAL   PLAN: PT FREQUENCY: 1-2x/week  PT DURATION: 12 weeks  PLANNED INTERVENTIONS: Therapeutic exercises, Therapeutic activity, Neuromuscular re-education, Balance training, Gait training, Patient/Family education, Self Care, Joint mobilization, Joint manipulation, Orthotic/Fit training, DME instructions, Aquatic Therapy, Dry Needling, Electrical stimulation, Spinal manipulation, Spinal mobilization, Cryotherapy, Moist heat, Manual lymph drainage, scar mobilization, Taping, Vasopneumatic device, Traction, Ultrasound, Ionotophoresis 4mg /ml Dexamethasone, Manual therapy, and Re-evaluation  PLAN FOR NEXT SESSION: Passive range of motion per protocol; shoulder instability protocol  Daleen Bo PT, DPT 10/27/22 9:44  AM

## 2022-11-01 ENCOUNTER — Encounter (HOSPITAL_BASED_OUTPATIENT_CLINIC_OR_DEPARTMENT_OTHER): Payer: Self-pay | Admitting: Physical Therapy

## 2022-11-01 ENCOUNTER — Ambulatory Visit (HOSPITAL_BASED_OUTPATIENT_CLINIC_OR_DEPARTMENT_OTHER): Payer: 59 | Admitting: Physical Therapy

## 2022-11-01 DIAGNOSIS — M25511 Pain in right shoulder: Secondary | ICD-10-CM

## 2022-11-01 DIAGNOSIS — R600 Localized edema: Secondary | ICD-10-CM

## 2022-11-01 DIAGNOSIS — M25611 Stiffness of right shoulder, not elsewhere classified: Secondary | ICD-10-CM

## 2022-11-01 NOTE — Therapy (Signed)
OUTPATIENT PHYSICAL THERAPY UPPER EXTREMITY EVALUATION   Patient Name: Gary Hayes MRN: [id number] DOB:Nov 13, 1975, 47 y.o., male Today's Date: 11/01/2022  END OF SESSION:  PT End of Session - 11/01/22 0849     Visit Number 10    Number of Visits 25    Date for PT Re-Evaluation 12/07/22    Authorization Type [employer]    PT Start Time 0845    PT Stop Time 0919    PT Time Calculation (min) 34 min    Activity Tolerance Patient tolerated treatment well    Behavior During Therapy [facility] for tasks assessed/performed                  Past Medical History:  Diagnosis Date   Arthritis    bilateral shoulders   Chicken pox    Frequent headaches    Hay fever    Past Surgical History:  Procedure Laterality Date   SHOULDER ARTHROSCOPY WITH LABRAL REPAIR Left 11/15/2021   Procedure: left SHOULDER ARTHROSCOPY WITH LABRAL REPAIR;  Surgeon: Vanetta Mulders, MD;  Location: [place];  Service: Orthopedics;  Laterality: Left;   SHOULDER ARTHROSCOPY WITH LABRAL REPAIR Right 09/05/2022   Procedure: RIGHT SHOULDER ARTHROSCOPY WITH LABRAL REPAIR;  Surgeon: Vanetta Mulders, MD;  Location: [place];  Service: Orthopedics;  Laterality: Right;   WISDOM TOOTH EXTRACTION     Patient Active Problem List   Diagnosis Date Noted   Degenerative superior labral anterior-to-posterior (SLAP) tear of right shoulder    Plantar fasciitis 12/20/2018   Tightness of right gastrocnemius muscle 12/20/2018   Shoulder separation 12/20/2018    PCP: Billie Ruddy, MD   REFERRING PROVIDER:   Vanetta Mulders, MD    REFERRING DIAG:  Diagnosis  [phone] (ICD-10-CM) - Labral tear of shoulder, right, initial encounter    THERAPY DIAG:  Acute pain of right shoulder  Stiffness of right shoulder, not elsewhere classified  Localized edema  Rationale for Evaluation and Treatment: Rehabilitation  ONSET DATE: 09/05/22 DOS  Days since surgery: 57   1.  Right shoulder inferior labral  repair 2.  Right shoulder humeral head abrasion arthroplasty 3.  Right shoulder extensive debridement (humeral head, anterior, inferior, posterior labrum)    SUBJECTIVE:                                                                                                                                                                                      SUBJECTIVE STATEMENT: Pt reports he is a little more sore after last session with the addition of new exercise. Otherwise some slight stiffness today but has improved with ABD and flexion ROM. He feels he is  able to reach up a little higher with grabbing items now.   PERTINENT HISTORY: L labral repair  PAIN:  Are you having pain? no: NPRS scale: 0/10 Pain location: surgical incision sites  Pain description: sharp/aching Aggravating factors: movement Relieving factors: n/a  PRECAUTIONS: Shoulder   WEIGHT BEARING RESTRICTIONS Yes labral repair   FALLS:  Has patient fallen in last 6 months? No Number of falls: 0   LIVING ENVIRONMENT: Lives with: lives with their family and lives with their spouse     OCCUPATION: Chief Financial Officer, mostly at a computer   PLOF: Independent   PATIENT GOALS exercise, sports- basketball, running, baseball  OBJECTIVE:   DIAGNOSTIC FINDINGS:   IMPRESSION: 1. Moderate right glenohumeral joint osteoarthritis. 2. Superior labral degeneration without a well-defined tear. 3. Mild infraspinatus greater than supraspinatus tendinosis. No rotator cuff tear. 4. Mild intra-articular biceps tendinosis. 5. Moderate arthropathy of the AC joint.    PATIENT SURVEYS:  FOTO 29 68 at DC 23 pts MCII  10/11/22: 47 11/01/22: 62 pts     UPPER EXTREMITY ROM: Full endrange passive range of motion deferred at this time given 3 days postop   Passive ROM Right eval R  1/10 Left eval  Shoulder flexion 50 140 WFL  Shoulder extension    WFL  Shoulder abduction 35 139 WFL  Shoulder adduction       Shoulder internal  rotation 35 50 WFL  Shoulder external rotation 0 45 WFL  Elbow flexion 120  WFL  Elbow extension -20  WFL  (Blank rows = not tested)   UPPER EXTREMITY MMT: full MMT not indicated at this time but pt able to sustain 4/5 resistance in all planes on R      TODAY'S TREATMENT:     1/10 Manual: R GHJ inf and post grade III Shoulder clocks grade III   Shoulder pulley flexion and ABD 2 min each  Modified plantigrade protraction 2x10 YTB ER 2x10 Supine horizontal ABD 2x10 S/L ABD 1lb 2x10 OH press no weight 2x10 Wall push up 2x15  1/5 Manual: R GHJ inf and post grade III Shoulder clocks grade III   Star gazer stretch 15s 4x  Supine 2lb protraction 3x10 Side lying ER 2x10 3s Supine horizontal ABD 2x10 S/L ABD 2x10 Bent over T and Y 15x each 1lb Biceps table stretch 30s 3x  1/3 Manual: R GHJ inf and post grade III  Side lying ER 2x10 3s S/L ABD 2x10 Bent over T and Y 15x each Row 2x10 yellow  Wall slide 10x each flexion and ABD 2s hold at top Shoulder extension 2x10 yellow. Pulley into flexion and ABD 63min  each                                                                                                                                    12/27 Manual: trigger point release to upper trap and pec Chest press 2lb bar  Wand flexion 1lb bar through full range 2x10  Side lying ER 2x10  Flexion in the mirror 2x10  Row 2x10 yellow  Shoulder extension 2x10 yellow. Pulley into flexion 2 min     Treatment                            10/11/22:  PROM with STM to upper trap & subscap; AP mobs through abd With 1lb bar: chest press, flx/ext bw 45-120 deg, protraction at 90 deg AROM against gravity in mirror Pulleys flexion Ended session with passivestretching   12/18 PROM per limits; STM levator scap & upper trap; AP GHJ mobs at neutral Supine ER x15  Supine chest press 2x15 1 lbs  Supine wand flexion x15 1 lb  Pulley into flexion 2 min  Prone row 2x15  Prone  extension 2x15  Prone horizontal abduction x15     Treatment                            09/29/22:  PROM per limits; STM levator scap & upper trap; AP GHJ mobs at neutral -30 abd Supine AAROM chest press Seated AROM: ER, flexion short lever arm, biceps curl Standing bent over triceps ext   Treatment                            09/22/22:  Prom in protocol limits Isometric ext, abd, ER, flexion Pulleys flexion & scaption to 90 STM to bil upper trap & levator, suboccipital realease, upper trap stretching   PATIENT EDUCATION: Education details: anatomy, progress/exam findings, exercise progression, HEP, POC Person educated: Patient Education method: Explanation, Demonstration, Tactile cues, Verbal cues, and Handouts Education comprehension: verbalized understanding and returned demonstration  HOME EXERCISE PROGRAM:  Access Code: [email or web address] URL: [email or web address]   ASSESSMENT:  CLINICAL IMPRESSION: Pt with significantly improve AROM and PROM at today's session, even compared to last. Pt able to being gym based movements but with minimal weight. Pt cleared to start light running/jogging and intro of gym based movements at low weight. Pt advised that gym exercise will need to be slow and graded. No throwing at this time. Pt continues to make continuous/steady progress and is progressing well through protocol. Patient continues to progress well as per protocol.  Patient will benefit from continued skilled therapy in order to address functional deficits for return to prior level of function exercise  OBJECTIVE IMPAIRMENTS: decreased ROM, decreased strength, hypomobility, increased edema, increased fascial restrictions, increased muscle spasms, impaired flexibility, impaired sensation, impaired UE functional use, improper body mechanics, postural dysfunction, and pain.   ACTIVITY LIMITATIONS: carrying, lifting, sleeping, transfers, bed mobility, bathing, dressing, reach over  head, and hygiene/grooming  PARTICIPATION LIMITATIONS: meal prep, cleaning, laundry, driving, shopping, community activity, occupation, and exercise  PERSONAL FACTORS: Time since onset of injury/illness/exacerbation and 1 comorbidity:    are also affecting patient's functional outcome.     GOALS:   SHORT TERM GOALS: Target date: 10/20/2022    Pt will become independent with HEP in order to demonstrate synthesis of PT education. Baseline: Goal status: achieved  2.  Pt will be able to demonstrate PROM flexion to 140 and ABD to 80 in order to demonstrate functional improvement in UE ROM for progression to next phase of rehab.  Baseline:  Goal status: achieved  3.  Pt will score at least 23 pt increase on  FOTO to demonstrate functional improvement in MCII and pt perceived function.   Baseline:  Goal status: achieved    LONG TERM GOALS: Target date: 12/01/2022   Pt  will become independent with final HEP in order to demonstrate synthesis of PT education.  Baseline:  Goal status: ongoing  2.  Pt will score >/= 68 on FOTO to demonstrate improvement in perceived right upper extremity function.  Baseline:  Goal status: ongoing  3.  Pt will be able to demonstrate full AROM in order to demonstrate functional improvement in UE function for self-care and house hold duties.  Baseline:  Goal status: partially met  4.  Pt will be able to demonstrate ability to lift >/= 3lbs in order to demonstrate functional improvement in UE function for self-care and house hold duties.  Baseline:  Goal status: ongoing  5.  Pt will be able to demonstrate ability to being light to moderate resistance training in order to demonstrate functional improvement in UE function for progression towards exercise.  Baseline:  Goal status: ongoing   PLAN: PT FREQUENCY: 1-2x/week  PT DURATION: 12 weeks  PLANNED INTERVENTIONS: Therapeutic exercises, Therapeutic activity, Neuromuscular re-education, Balance  training, Gait training, Patient/Family education, Self Care, Joint mobilization, Joint manipulation, Orthotic/Fit training, DME instructions, Aquatic Therapy, Dry Needling, Electrical stimulation, Spinal manipulation, Spinal mobilization, Cryotherapy, Moist heat, Manual lymph drainage, scar mobilization, Taping, Vasopneumatic device, Traction, Ultrasound, Ionotophoresis 4mg /ml Dexamethasone, Manual therapy, and Re-evaluation  PLAN FOR NEXT SESSION: shoulder instability protocol  PT, DPT 11/01/22 9:26 AM

## 2022-11-03 ENCOUNTER — Encounter (HOSPITAL_BASED_OUTPATIENT_CLINIC_OR_DEPARTMENT_OTHER): Payer: Self-pay | Admitting: Physical Therapy

## 2022-11-03 ENCOUNTER — Ambulatory Visit (HOSPITAL_BASED_OUTPATIENT_CLINIC_OR_DEPARTMENT_OTHER): Payer: 59 | Admitting: Physical Therapy

## 2022-11-03 DIAGNOSIS — M25511 Pain in right shoulder: Secondary | ICD-10-CM

## 2022-11-03 DIAGNOSIS — R600 Localized edema: Secondary | ICD-10-CM

## 2022-11-03 DIAGNOSIS — M25611 Stiffness of right shoulder, not elsewhere classified: Secondary | ICD-10-CM

## 2022-11-03 NOTE — Therapy (Signed)
OUTPATIENT PHYSICAL THERAPY UPPER EXTREMITY EVALUATION   Patient Name: Gary Hayes MRN: [id number] DOB:[birth year], 47 y.o., male Today's Date: 11/03/2022  END OF SESSION:  PT End of Session - 11/03/22 0847     Visit Number 11    Number of Visits 25    Date for PT Re-Evaluation 12/07/22    Authorization Type [employer]    PT Start Time 0845    PT Stop Time 0923    PT Time Calculation (min) 38 min    Activity Tolerance Patient tolerated treatment well    Behavior During Therapy [facility] for tasks assessed/performed                   Past Medical History:  Diagnosis Date   Arthritis    bilateral shoulders   Chicken pox    Frequent headaches    Hay fever    Past Surgical History:  Procedure Laterality Date   SHOULDER ARTHROSCOPY WITH LABRAL REPAIR Left 11/15/2021   Procedure: left SHOULDER ARTHROSCOPY WITH LABRAL REPAIR;  Surgeon: [name], MD;  Location: [facility] OR;  Service: Orthopedics;  Laterality: Left;   SHOULDER ARTHROSCOPY WITH LABRAL REPAIR Right 09/05/2022   Procedure: RIGHT SHOULDER ARTHROSCOPY WITH LABRAL REPAIR;  Surgeon: [name], MD;  Location: [facility];  Service: Orthopedics;  Laterality: Right;   WISDOM TOOTH EXTRACTION     Patient Active Problem List   Diagnosis Date Noted   Degenerative superior labral anterior-to-posterior (SLAP) tear of right shoulder    Plantar fasciitis 12/20/2018   Tightness of right gastrocnemius muscle 12/20/2018   Shoulder separation 12/20/2018    PCP: Deeann Saint, MD   REFERRING PROVIDER:   [name], MD    REFERRING DIAG:  Diagnosis  [phone] (ICD-10-CM) - Labral tear of shoulder, right, initial encounter    THERAPY DIAG:  Acute pain of right shoulder  Stiffness of right shoulder, not elsewhere classified  Localized edema  Rationale for Evaluation and Treatment: Rehabilitation  ONSET DATE: 09/05/22 DOS  Days since surgery: 59   1.  Right shoulder inferior labral  repair 2.  Right shoulder humeral head abrasion arthroplasty 3.  Right shoulder extensive debridement (humeral head, anterior, inferior, posterior labrum)    SUBJECTIVE:                                                                                                                                                                                      SUBJECTIVE STATEMENT: Pt states the shoulder was sore after last session but not incredibly so. Tight with new exercise but loosens up with stretching and exercise. Pt is 8 wks  PERTINENT HISTORY: L labral  repair  PAIN:  Are you having pain? no: NPRS scale: 0/10 Pain location: surgical incision sites  Pain description: sharp/aching Aggravating factors: movement Relieving factors: n/a  PRECAUTIONS: Shoulder   WEIGHT BEARING RESTRICTIONS Yes labral repair   FALLS:  Has patient fallen in last 6 months? No Number of falls: 0   LIVING ENVIRONMENT: Lives with: lives with their family and lives with their spouse     OCCUPATION: Chief Financial Officer, mostly at a computer   PLOF: Independent   PATIENT GOALS exercise, sports- basketball, running, baseball  OBJECTIVE:   DIAGNOSTIC FINDINGS:   IMPRESSION: 1. Moderate right glenohumeral joint osteoarthritis. 2. Superior labral degeneration without a well-defined tear. 3. Mild infraspinatus greater than supraspinatus tendinosis. No rotator cuff tear. 4. Mild intra-articular biceps tendinosis. 5. Moderate arthropathy of the AC joint.    PATIENT SURVEYS:  FOTO 29 68 at DC 23 pts MCII  10/11/22: 47 11/01/22: 62 pts     UPPER EXTREMITY ROM: Full endrange passive range of motion deferred at this time given 3 days postop   Passive ROM Right eval R  1/10 Left eval  Shoulder flexion 50 140 WFL  Shoulder extension    WFL  Shoulder abduction 35 139 WFL  Shoulder adduction       Shoulder internal rotation 35 50 WFL  Shoulder external rotation 0 45 WFL  Elbow flexion 120  WFL  Elbow  extension -20  WFL  (Blank rows = not tested)   UPPER EXTREMITY MMT: full MMT not indicated at this time but pt able to sustain 4/5 resistance in all planes on R      TODAY'S TREATMENT:     1/12  UBE L1 2 min retro and fwd each  Manual: R GHJ inf and post grade III Shoulder clocks grade III   STM L deltoid  Modified plantigrade protraction 2x10 YTB ER 2x10 Supine horizontal ABD 10s 5x S/L ABD 1lb 2x10 Bent over row 5lb 2x10 SA foam roll up wall 2x12 Table push up/eccentric lower 3x5  1/10 Manual: R GHJ inf and post grade III Shoulder clocks grade III   Shoulder pulley flexion and ABD 2 min each  Modified plantigrade protraction 2x10 YTB ER 2x10 Supine horizontal ABD 2x10 S/L ABD 1lb 2x10 OH press no weight 2x10 Wall push up 2x15  1/5 Manual: R GHJ inf and post grade III Shoulder clocks grade III   Star gazer stretch 15s 4x  Supine 2lb protraction 3x10 Side lying ER 2x10 3s Supine horizontal ABD 2x10 S/L ABD 2x10 Bent over T and Y 15x each 1lb Biceps table stretch 30s 3x  1/3 Manual: R GHJ inf and post grade III  Side lying ER 2x10 3s S/L ABD 2x10 Bent over T and Y 15x each Row 2x10 yellow  Wall slide 10x each flexion and ABD 2s hold at top Shoulder extension 2x10 yellow. Pulley into flexion and ABD 32min  each  12/27 Manual: trigger point release to upper trap and pec Chest press 2lb bar  Wand flexion 1lb bar through full range 2x10  Side lying ER 2x10  Flexion in the mirror 2x10  Row 2x10 yellow  Shoulder extension 2x10 yellow. Pulley into flexion 2 min     Treatment                            10/11/22:  PROM with STM to upper trap & subscap; AP mobs through abd With 1lb bar: chest press, flx/ext bw 45-120 deg, protraction at 90 deg AROM against gravity in mirror Pulleys flexion Ended session with  passivestretching   12/18 PROM per limits; STM levator scap & upper trap; AP GHJ mobs at neutral Supine ER x15  Supine chest press 2x15 1 lbs  Supine wand flexion x15 1 lb  Pulley into flexion 2 min  Prone row 2x15  Prone extension 2x15  Prone horizontal abduction x15     Treatment                            09/29/22:  PROM per limits; STM levator scap & upper trap; AP GHJ mobs at neutral -30 abd Supine AAROM chest press Seated AROM: ER, flexion short lever arm, biceps curl Standing bent over triceps ext   Treatment                            09/22/22:  Prom in protocol limits Isometric ext, abd, ER, flexion Pulleys flexion & scaption to 90 STM to bil upper trap & levator, suboccipital realease, upper trap stretching   PATIENT EDUCATION: Education details: anatomy, progress/exam findings, exercise progression, HEP, POC Person educated: Patient Education method: Explanation, Demonstration, Tactile cues, Verbal cues, and Handouts Education comprehension: verbalized understanding and returned demonstration  HOME EXERCISE PROGRAM:  Access Code: [email or web address] URL: [email or web address]   ASSESSMENT:  CLINICAL IMPRESSION: Pt continues to progress well with exercise and is able to begin increasing loading of the R shoulder without discomfort or irritation. No new HEP added at this time. Plan to look at an isometric hold/carry at next session and update accordingly. Pt doing very well with progression of AROM with mild stiffness at end range AROM but able to reach near full with A/AROM.  Patient will benefit from continued skilled therapy in order to address functional deficits for return to prior level of function exercise  OBJECTIVE IMPAIRMENTS: decreased ROM, decreased strength, hypomobility, increased edema, increased fascial restrictions, increased muscle spasms, impaired flexibility, impaired sensation, impaired UE functional use, improper body mechanics, postural  dysfunction, and pain.   ACTIVITY LIMITATIONS: carrying, lifting, sleeping, transfers, bed mobility, bathing, dressing, reach over head, and hygiene/grooming  PARTICIPATION LIMITATIONS: meal prep, cleaning, laundry, driving, shopping, community activity, occupation, and exercise  PERSONAL FACTORS: Time since onset of injury/illness/exacerbation and 1 comorbidity:    are also affecting patient's functional outcome.     GOALS:   SHORT TERM GOALS: Target date: 10/20/2022    Pt will become independent with HEP in order to demonstrate synthesis of PT education. Baseline: Goal status: achieved  2.  Pt will be able to demonstrate PROM flexion to 140 and ABD to 80 in order to demonstrate functional improvement in UE ROM for progression to next phase of rehab.  Baseline:  Goal status: achieved  3.  Pt will score at least 23  pt increase on FOTO to demonstrate functional improvement in MCII and pt perceived function.   Baseline:  Goal status: achieved    LONG TERM GOALS: Target date: 12/01/2022   Pt  will become independent with final HEP in order to demonstrate synthesis of PT education.  Baseline:  Goal status: ongoing  2.  Pt will score >/= 68 on FOTO to demonstrate improvement in perceived right upper extremity function.  Baseline:  Goal status: ongoing  3.  Pt will be able to demonstrate full AROM in order to demonstrate functional improvement in UE function for self-care and house hold duties.  Baseline:  Goal status: partially met  4.  Pt will be able to demonstrate ability to lift >/= 3lbs in order to demonstrate functional improvement in UE function for self-care and house hold duties.  Baseline:  Goal status: ongoing  5.  Pt will be able to demonstrate ability to being light to moderate resistance training in order to demonstrate functional improvement in UE function for progression towards exercise.  Baseline:  Goal status: ongoing   PLAN: PT FREQUENCY:  1-2x/week  PT DURATION: 12 weeks  PLANNED INTERVENTIONS: Therapeutic exercises, Therapeutic activity, Neuromuscular re-education, Balance training, Gait training, Patient/Family education, Self Care, Joint mobilization, Joint manipulation, Orthotic/Fit training, DME instructions, Aquatic Therapy, Dry Needling, Electrical stimulation, Spinal manipulation, Spinal mobilization, Cryotherapy, Moist heat, Manual lymph drainage, scar mobilization, Taping, Vasopneumatic device, Traction, Ultrasound, Ionotophoresis 4mg /ml Dexamethasone, Manual therapy, and Re-evaluation  PLAN FOR NEXT SESSION: shoulder instability protocol  Daleen Bo PT, DPT 11/03/22 9:36 AM

## 2022-11-06 ENCOUNTER — Encounter (HOSPITAL_BASED_OUTPATIENT_CLINIC_OR_DEPARTMENT_OTHER): Payer: Self-pay | Admitting: Physical Therapy

## 2022-11-06 ENCOUNTER — Ambulatory Visit (HOSPITAL_BASED_OUTPATIENT_CLINIC_OR_DEPARTMENT_OTHER): Payer: 59 | Admitting: Physical Therapy

## 2022-11-06 DIAGNOSIS — M25511 Pain in right shoulder: Secondary | ICD-10-CM | POA: Diagnosis not present

## 2022-11-06 DIAGNOSIS — M25611 Stiffness of right shoulder, not elsewhere classified: Secondary | ICD-10-CM

## 2022-11-06 DIAGNOSIS — R600 Localized edema: Secondary | ICD-10-CM

## 2022-11-06 NOTE — Therapy (Signed)
OUTPATIENT PHYSICAL THERAPY UPPER EXTREMITY EVALUATION   Patient Name: Gary Hayes MRN: [id number] DOB:09-02-1976, 47 y.o., male Today's Date: 11/06/2022  END OF SESSION:  PT End of Session - 11/06/22 1316     Visit Number 12    Number of Visits 25    Date for PT Re-Evaluation 12/07/22    Authorization Type U[employer]    PT Start Time 1302    PT Stop Time 1343    PT Time Calculation (min) 41 min    Activity Tolerance Patient tolerated treatment well    Behavior During Therapy WFL for tasks assessed/performed                    Past Medical History:  Diagnosis Date   Arthritis    bilateral shoulders   Chicken pox    Frequent headaches    Hay fever    Past Surgical History:  Procedure Laterality Date   SHOULDER ARTHROSCOPY WITH LABRAL REPAIR Left 11/15/2021   Procedure: left SHOULDER ARTHROSCOPY WITH LABRAL REPAIR;  Surgeon: [name], MD;  Location: [facility] OR;  Service: Orthopedics;  Laterality: Left;   SHOULDER ARTHROSCOPY WITH LABRAL REPAIR Right 09/05/2022   Procedure: RIGHT SHOULDER ARTHROSCOPY WITH LABRAL REPAIR;  Surgeon: [name], MD;  Location: [facility];  Service: Orthopedics;  Laterality: Right;   WISDOM TOOTH EXTRACTION     Patient Active Problem List   Diagnosis Date Noted   Degenerative superior labral anterior-to-posterior (SLAP) tear of right shoulder    Plantar fasciitis 12/20/2018   Tightness of right gastrocnemius muscle 12/20/2018   Shoulder separation 12/20/2018    PCP: Deeann Saint, MD   REFERRING PROVIDER:   [name], MD    REFERRING DIAG:  Diagnosis  [id number] (ICD-10-CM) - Labral tear of shoulder, right, initial encounter    THERAPY DIAG:  Acute pain of right shoulder  Stiffness of right shoulder, not elsewhere classified  Localized edema  Rationale for Evaluation and Treatment: Rehabilitation  ONSET DATE: 09/05/22 DOS  Days since surgery: 62   1.  Right shoulder inferior labral  repair 2.  Right shoulder humeral head abrasion arthroplasty 3.  Right shoulder extensive debridement (humeral head, anterior, inferior, posterior labrum)    SUBJECTIVE:                                                                                                                                                                                      SUBJECTIVE STATEMENT: Pt states the shoulder is similarly tight going OH with mild soreness after last session. No pain.   PERTINENT HISTORY: L labral repair  PAIN:  Are you having pain? no:  NPRS scale: 0/10 Pain location: surgical incision sites  Pain description: sharp/aching Aggravating factors: movement Relieving factors: n/a  PRECAUTIONS: Shoulder   WEIGHT BEARING RESTRICTIONS Yes labral repair   FALLS:  Has patient fallen in last 6 months? No Number of falls: 0   LIVING ENVIRONMENT: Lives with: lives with their family and lives with their spouse     OCCUPATION: Chief Financial Officer, mostly at a computer   PLOF: Independent   PATIENT GOALS exercise, sports- basketball, running, baseball  OBJECTIVE:   DIAGNOSTIC FINDINGS:   IMPRESSION: 1. Moderate right glenohumeral joint osteoarthritis. 2. Superior labral degeneration without a well-defined tear. 3. Mild infraspinatus greater than supraspinatus tendinosis. No rotator cuff tear. 4. Mild intra-articular biceps tendinosis. 5. Moderate arthropathy of the AC joint.    PATIENT SURVEYS:  FOTO 29 68 at DC 23 pts MCII  10/11/22: 47 11/01/22: 62 pts     UPPER EXTREMITY ROM: Full endrange passive range of motion deferred at this time given 3 days postop   Passive ROM Right eval R  1/10 Left eval  Shoulder flexion 50 140 WFL  Shoulder extension    WFL  Shoulder abduction 35 139 WFL  Shoulder adduction       Shoulder internal rotation 35 50 WFL  Shoulder external rotation 0 45 WFL  Elbow flexion 120  WFL  Elbow extension -20  WFL  (Blank rows = not tested)   UPPER  EXTREMITY MMT: full MMT not indicated at this time but pt able to sustain 4/5 resistance in all planes on R      TODAY'S TREATMENT:    1/15  UBE L1 2 min retro and fwd each  Manual: R GHJ inf and post grade III Shoulder clocks grade III   Supine pec stretch 5sx10 Manually resisted D1/2 flexion 3x10ea  Modified plantigrade protraction 2x10 RTB ER 2x10 Supine horizontal ABD 10s 5x S/L ABD 1lb 2x10 SA foam roll up wall 2x12 Modified plantigrade alternating shoulder taps x10 Table push up/eccentric lower 3x5 ecc, 2x5 full push up 10lb farmer's carry 29ft 2x laps    1/12  UBE L1 2 min retro and fwd each  Manual: R GHJ inf and post grade III Shoulder clocks grade III   STM L deltoid  Modified plantigrade protraction 2x10 YTB ER 2x10 Supine horizontal ABD 10s 5x S/L ABD 1lb 2x10 Bent over row 5lb 2x10 SA foam roll up wall 2x12 Table push up/eccentric lower 3x5  1/10 Manual: R GHJ inf and post grade III Shoulder clocks grade III   Shoulder pulley flexion and ABD 2 min each  Modified plantigrade protraction 2x10 YTB ER 2x10 Supine horizontal ABD 2x10 S/L ABD 1lb 2x10 OH press no weight 2x10 Wall push up 2x15  1/5 Manual: R GHJ inf and post grade III Shoulder clocks grade III   Star gazer stretch 15s 4x  Supine 2lb protraction 3x10 Side lying ER 2x10 3s Supine horizontal ABD 2x10 S/L ABD 2x10 Bent over T and Y 15x each 1lb Biceps table stretch 30s 3x  1/3 Manual: R GHJ inf and post grade III  Side lying ER 2x10 3s S/L ABD 2x10 Bent over T and Y 15x each Row 2x10 yellow  Wall slide 10x each flexion and ABD 2s hold at top Shoulder extension 2x10 yellow. Pulley into flexion and ABD 58min  each  PATIENT EDUCATION: Education details: anatomy, progress/exam findings, exercise progression, HEP, POC Person educated:  Patient Education method: Explanation, Demonstration, Tactile cues, Verbal cues, and Handouts Education comprehension: verbalized understanding and returned demonstration  HOME EXERCISE PROGRAM:  Access Code: [email or web address] (prefers electronic)  URL: [email or web address]   ASSESSMENT:  CLINICAL IMPRESSION: Patient with improved right shoulder glenohumeral joint mobility following mobilization at today's session.  Patient then able to follow-up follow-up with progressive strengthening for the right scapular region and right calf today without increase in pain patient able to start multiplanar strengthening today also without increasing pain.  Patient progressing very well at home exercise updated accordingly.  Patient advised on appropriate level of recovery and to avoid 3 times a week of home exercise program if anterior shoulder pain starts to occur.  Patient likely to decrease frequency to 1X session week as he is progressing very well per protocol.  Patient will benefit from continued skilled therapy in order to address functional deficits for return to prior level of function exercise  OBJECTIVE IMPAIRMENTS: decreased ROM, decreased strength, hypomobility, increased edema, increased fascial restrictions, increased muscle spasms, impaired flexibility, impaired sensation, impaired UE functional use, improper body mechanics, postural dysfunction, and pain.   ACTIVITY LIMITATIONS: carrying, lifting, sleeping, transfers, bed mobility, bathing, dressing, reach over head, and hygiene/grooming  PARTICIPATION LIMITATIONS: meal prep, cleaning, laundry, driving, shopping, community activity, occupation, and exercise  PERSONAL FACTORS: Time since onset of injury/illness/exacerbation and 1 comorbidity:    are also affecting patient's functional outcome.     GOALS:   SHORT TERM GOALS: Target date: 10/20/2022    Pt will become independent with HEP in order to demonstrate synthesis of PT  education. Baseline: Goal status: achieved  2.  Pt will be able to demonstrate PROM flexion to 140 and ABD to 80 in order to demonstrate functional improvement in UE ROM for progression to next phase of rehab.  Baseline:  Goal status: achieved  3.  Pt will score at least 23 pt increase on FOTO to demonstrate functional improvement in MCII and pt perceived function.   Baseline:  Goal status: achieved    LONG TERM GOALS: Target date: 12/01/2022   Pt  will become independent with final HEP in order to demonstrate synthesis of PT education.  Baseline:  Goal status: ongoing  2.  Pt will score >/= 68 on FOTO to demonstrate improvement in perceived right upper extremity function.  Baseline:  Goal status: ongoing  3.  Pt will be able to demonstrate full AROM in order to demonstrate functional improvement in UE function for self-care and house hold duties.  Baseline:  Goal status: partially met  4.  Pt will be able to demonstrate ability to lift >/= 3lbs in order to demonstrate functional improvement in UE function for self-care and house hold duties.  Baseline:  Goal status: ongoing  5.  Pt will be able to demonstrate ability to being light to moderate resistance training in order to demonstrate functional improvement in UE function for progression towards exercise.  Baseline:  Goal status: ongoing   PLAN: PT FREQUENCY: 1-2x/week  PT DURATION: 12 weeks  PLANNED INTERVENTIONS: Therapeutic exercises, Therapeutic activity, Neuromuscular re-education, Balance training, Gait training, Patient/Family education, Self Care, Joint mobilization, Joint manipulation, Orthotic/Fit training, DME instructions, Aquatic Therapy, Dry Needling, Electrical stimulation, Spinal manipulation, Spinal mobilization, Cryotherapy, Moist heat, Manual lymph drainage, scar mobilization, Taping, Vasopneumatic device, Traction, Ultrasound, Ionotophoresis 4mg /ml Dexamethasone, Manual therapy, and  Re-evaluation  PLAN FOR NEXT SESSION: shoulder instability protocol  Daleen Bo  PT, DPT 11/06/22 1:47 PM

## 2022-11-13 ENCOUNTER — Ambulatory Visit (HOSPITAL_BASED_OUTPATIENT_CLINIC_OR_DEPARTMENT_OTHER): Payer: 59 | Admitting: Physical Therapy

## 2022-11-13 ENCOUNTER — Encounter (HOSPITAL_BASED_OUTPATIENT_CLINIC_OR_DEPARTMENT_OTHER): Payer: Self-pay | Admitting: Physical Therapy

## 2022-11-13 DIAGNOSIS — R600 Localized edema: Secondary | ICD-10-CM

## 2022-11-13 DIAGNOSIS — M25611 Stiffness of right shoulder, not elsewhere classified: Secondary | ICD-10-CM

## 2022-11-13 DIAGNOSIS — M25511 Pain in right shoulder: Secondary | ICD-10-CM

## 2022-11-13 NOTE — Therapy (Signed)
OUTPATIENT PHYSICAL THERAPY UPPER EXTREMITY EVALUATION   Patient Name: MEHKI KLUMPP MRN: [id number] DOB:05-Jul-1976, 47 y.o., male Today's Date: 11/13/2022  END OF SESSION:  PT End of Session - 11/13/22 1351     Visit Number 13    Number of Visits 25    Date for PT Re-Evaluation 12/07/22    Authorization Type [employer]    PT Start Time 1302    PT Stop Time 1342    PT Time Calculation (min) 40 min    Activity Tolerance Patient tolerated treatment well    Behavior During Therapy WFL for tasks assessed/performed                     Past Medical History:  Diagnosis Date   Arthritis    bilateral shoulders   Chicken pox    Frequent headaches    Hay fever    Past Surgical History:  Procedure Laterality Date   SHOULDER ARTHROSCOPY WITH LABRAL REPAIR Left 11/15/2021   Procedure: left SHOULDER ARTHROSCOPY WITH LABRAL REPAIR;  Surgeon: Vanetta Mulders, MD;  Location: [place];  Service: Orthopedics;  Laterality: Left;   SHOULDER ARTHROSCOPY WITH LABRAL REPAIR Right 09/05/2022   Procedure: RIGHT SHOULDER ARTHROSCOPY WITH LABRAL REPAIR;  Surgeon: Vanetta Mulders, MD;  Location: [place];  Service: Orthopedics;  Laterality: Right;   WISDOM TOOTH EXTRACTION     Patient Active Problem List   Diagnosis Date Noted   Degenerative superior labral anterior-to-posterior (SLAP) tear of right shoulder    Plantar fasciitis 12/20/2018   Tightness of right gastrocnemius muscle 12/20/2018   Shoulder separation 12/20/2018    PCP: Billie Ruddy, MD   REFERRING PROVIDER:   Vanetta Mulders, MD    REFERRING DIAG:  Diagnosis  [phone] (ICD-10-CM) - Labral tear of shoulder, right, initial encounter    THERAPY DIAG:  Acute pain of right shoulder  Stiffness of right shoulder, not elsewhere classified  Localized edema  Rationale for Evaluation and Treatment: Rehabilitation  ONSET DATE: 09/05/22 DOS  Days since surgery: 69   1.  Right shoulder inferior labral  repair 2.  Right shoulder humeral head abrasion arthroplasty 3.  Right shoulder extensive debridement (humeral head, anterior, inferior, posterior labrum)    SUBJECTIVE:                                                                                                                                                                                      SUBJECTIVE STATEMENT: Pt states he was doing well after last session but went to workout with his son and had pain after. No acute injury but felt pain. Was lifting/pushing a 10lb medicine  ball while trying to keep most of the weight on the L but felt he did too much.   PERTINENT HISTORY: L labral repair  PAIN:  Are you having pain? Yes: NPRS scale: 1-2/10 Pain location: R shoulder Pain description: sharp/aching Aggravating factors: movement Relieving factors: n/a  PRECAUTIONS: Shoulder   WEIGHT BEARING RESTRICTIONS Yes labral repair   FALLS:  Has patient fallen in last 6 months? No Number of falls: 0   LIVING ENVIRONMENT: Lives with: lives with their family and lives with their spouse     OCCUPATION: Art gallery manager, mostly at a computer   PLOF: Independent   PATIENT GOALS exercise, sports- basketball, running, baseball  OBJECTIVE:   DIAGNOSTIC FINDINGS:   IMPRESSION: 1. Moderate right glenohumeral joint osteoarthritis. 2. Superior labral degeneration without a well-defined tear. 3. Mild infraspinatus greater than supraspinatus tendinosis. No rotator cuff tear. 4. Mild intra-articular biceps tendinosis. 5. Moderate arthropathy of the AC joint.    PATIENT SURVEYS:  FOTO 29 68 at DC 23 pts MCII  10/11/22: 47 11/01/22: 62 pts     UPPER EXTREMITY ROM: Full endrange passive range of motion deferred at this time given 3 days postop   Passive ROM Right eval R  1/10 Left eval  Shoulder flexion 50 140 WFL  Shoulder extension    WFL  Shoulder abduction 35 139 WFL  Shoulder adduction       Shoulder internal rotation 35  50 WFL  Shoulder external rotation 0 45 WFL  Elbow flexion 120  WFL  Elbow extension -20  WFL  (Blank rows = not tested)   UPPER EXTREMITY MMT: full MMT not indicated at this time but pt able to sustain 4/5 resistance in all planes on R      TODAY'S TREATMENT:    1/22   Manual: R GHJ inf and post grade III Shoulder clocks grade III   STM R pec and deltoid  Self STM with foam roller Foam roller pec stretch/angel 2x12  YTB 90/90 ER 2x10 Standing ABD 1lb 3x10 OHP neutral 1lb 2x10  1/15  UBE L1 2 min retro and fwd each  Manual: R GHJ inf and post grade III Shoulder clocks grade III   Supine pec stretch 5sx10 Manually resisted D1/2 flexion 3x10ea  Modified plantigrade protraction 2x10 RTB ER 2x10 Supine horizontal ABD 10s 5x S/L ABD 1lb 2x10 SA foam roll up wall 2x12 Modified plantigrade alternating shoulder taps x10 Table push up/eccentric lower 3x5 ecc, 2x5 full push up 10lb farmer's carry 10ft 2x laps    1/12  UBE L1 2 min retro and fwd each  Manual: R GHJ inf and post grade III Shoulder clocks grade III   STM L deltoid  Modified plantigrade protraction 2x10 YTB ER 2x10 Supine horizontal ABD 10s 5x S/L ABD 1lb 2x10 Bent over row 5lb 2x10 SA foam roll up wall 2x12 Table push up/eccentric lower 3x5  1/10 Manual: R GHJ inf and post grade III Shoulder clocks grade III   Shoulder pulley flexion and ABD 2 min each  Modified plantigrade protraction 2x10 YTB ER 2x10 Supine horizontal ABD 2x10 S/L ABD 1lb 2x10 OH press no weight 2x10 Wall push up 2x15  1/5 Manual: R GHJ inf and post grade III Shoulder clocks grade III   Star gazer stretch 15s 4x  Supine 2lb protraction 3x10 Side lying ER 2x10 3s Supine horizontal ABD 2x10 S/L ABD 2x10 Bent over T and Y 15x each 1lb Biceps table stretch 30s 3x  1/3 Manual: R  GHJ inf and post grade III  Side lying ER 2x10 3s S/L ABD 2x10 Bent over T and Y 15x each Row 2x10 yellow  Wall slide 10x each  flexion and ABD 2s hold at top Shoulder extension 2x10 yellow. Pulley into flexion and ABD  each                                                                                                                                     PATIENT EDUCATION: Education details: anatomy, progress/exam findings, exercise progression, HEP, POC Person educated: Patient Education method: Explanation, Demonstration, Tactile cues, Verbal cues, and Handouts Education comprehension: verbalized understanding and returned demonstration  HOME EXERCISE PROGRAM:  Access Code: [email or web address] (prefers electronic)  URL: [email or web address]   ASSESSMENT:  CLINICAL IMPRESSION: Pt without signs of injury to the R shoulder following report of overuse. No signs of a tendinitis type irritation. Pt was given self recovery techniques as well as progression of 90/90 and OH strength for the R shoulder. Pt also given edu about self progression of exercise and shoulder stability exercise as he returns to slow, graded progression with gym exercise. Pt to decrease to 1x/week as discussed- continues to do very well with shoulder rehab. Patient will benefit from continued skilled therapy in order to address functional deficits for return to prior level of function exercise  OBJECTIVE IMPAIRMENTS: decreased ROM, decreased strength, hypomobility, increased edema, increased fascial restrictions, increased muscle spasms, impaired flexibility, impaired sensation, impaired UE functional use, improper body mechanics, postural dysfunction, and pain.   ACTIVITY LIMITATIONS: carrying, lifting, sleeping, transfers, bed mobility, bathing, dressing, reach over head, and hygiene/grooming  PARTICIPATION LIMITATIONS: meal prep, cleaning, laundry, driving, shopping, community activity, occupation, and exercise  PERSONAL FACTORS: Time since onset of injury/illness/exacerbation and 1 comorbidity:    are also affecting patient's  functional outcome.     GOALS:   SHORT TERM GOALS: Target date: 10/20/2022    Pt will become independent with HEP in order to demonstrate synthesis of PT education. Baseline: Goal status: achieved  2.  Pt will be able to demonstrate PROM flexion to 140 and ABD to 80 in order to demonstrate functional improvement in UE ROM for progression to next phase of rehab.  Baseline:  Goal status: achieved  3.  Pt will score at least 23 pt increase on FOTO to demonstrate functional improvement in MCII and pt perceived function.   Baseline:  Goal status: achieved    LONG TERM GOALS: Target date: 12/01/2022   Pt  will become independent with final HEP in order to demonstrate synthesis of PT education.  Baseline:  Goal status: ongoing  2.  Pt will score >/= 68 on FOTO to demonstrate improvement in perceived right upper extremity function.  Baseline:  Goal status: ongoing  3.  Pt will be able to demonstrate full AROM in order to demonstrate functional improvement in UE function for self-care and house hold duties.  Baseline:  Goal status: partially met  4.  Pt will be able to demonstrate ability to lift >/= 3lbs in order to demonstrate functional improvement in UE function for self-care and house hold duties.  Baseline:  Goal status: ongoing  5.  Pt will be able to demonstrate ability to being light to moderate resistance training in order to demonstrate functional improvement in UE function for progression towards exercise.  Baseline:  Goal status: ongoing   PLAN: PT FREQUENCY: 1-2x/week  PT DURATION: 12 weeks  PLANNED INTERVENTIONS: Therapeutic exercises, Therapeutic activity, Neuromuscular re-education, Balance training, Gait training, Patient/Family education, Self Care, Joint mobilization, Joint manipulation, Orthotic/Fit training, DME instructions, Aquatic Therapy, Dry Needling, Electrical stimulation, Spinal manipulation, Spinal mobilization, Cryotherapy, Moist heat,  Manual lymph drainage, scar mobilization, Taping, Vasopneumatic device, Traction, Ultrasound, Ionotophoresis 4mg /ml Dexamethasone, Manual therapy, and Re-evaluation  PLAN FOR NEXT SESSION: shoulder instability protocol  Daleen Bo PT, DPT 11/13/22 1:56 PM

## 2022-11-15 ENCOUNTER — Encounter (HOSPITAL_BASED_OUTPATIENT_CLINIC_OR_DEPARTMENT_OTHER): Payer: 59 | Admitting: Physical Therapy

## 2022-11-21 ENCOUNTER — Ambulatory Visit (HOSPITAL_BASED_OUTPATIENT_CLINIC_OR_DEPARTMENT_OTHER): Payer: 59 | Admitting: Physical Therapy

## 2022-11-21 ENCOUNTER — Encounter (HOSPITAL_BASED_OUTPATIENT_CLINIC_OR_DEPARTMENT_OTHER): Payer: Self-pay | Admitting: Physical Therapy

## 2022-11-21 DIAGNOSIS — M25611 Stiffness of right shoulder, not elsewhere classified: Secondary | ICD-10-CM

## 2022-11-21 DIAGNOSIS — R600 Localized edema: Secondary | ICD-10-CM

## 2022-11-21 DIAGNOSIS — M25511 Pain in right shoulder: Secondary | ICD-10-CM

## 2022-11-21 NOTE — Therapy (Signed)
OUTPATIENT PHYSICAL THERAPY UPPER EXTREMITY EVALUATION   Patient Name: Gary Hayes MRN: [id number] DOB:[birth year], 47 y.o., male Today's Date: 11/21/2022  END OF SESSION:  PT End of Session - 11/21/22 0827     Visit Number 14    Number of Visits 25    Date for PT Re-Evaluation 12/07/22    Authorization Type [employer]    PT Start Time 0803    PT Stop Time 0842    PT Time Calculation (min) 39 min    Activity Tolerance Patient tolerated treatment well    Behavior During Therapy [place] for tasks assessed/performed                     Past Medical History:  Diagnosis Date   Arthritis    bilateral shoulders   Chicken pox    Frequent headaches    Hay fever    Past Surgical History:  Procedure Laterality Date   SHOULDER ARTHROSCOPY WITH LABRAL REPAIR Left 11/15/2021   Procedure: left SHOULDER ARTHROSCOPY WITH LABRAL REPAIR;  Surgeon: Vanetta Mulders, MD;  Location: [facility];  Service: Orthopedics;  Laterality: Left;   SHOULDER ARTHROSCOPY WITH LABRAL REPAIR Right 09/05/2022   Procedure: RIGHT SHOULDER ARTHROSCOPY WITH LABRAL REPAIR;  Surgeon: Vanetta Mulders, MD;  Location: [facility];  Service: Orthopedics;  Laterality: Right;   WISDOM TOOTH EXTRACTION     Patient Active Problem List   Diagnosis Date Noted   Degenerative superior labral anterior-to-posterior (SLAP) tear of right shoulder    Plantar fasciitis 12/20/2018   Tightness of right gastrocnemius muscle 12/20/2018   Shoulder separation 12/20/2018    PCP: Billie Ruddy, MD   REFERRING PROVIDER:   Vanetta Mulders, MD    REFERRING DIAG:  Diagnosis  [phone] (ICD-10-CM) - Labral tear of shoulder, right, initial encounter    THERAPY DIAG:  Acute pain of right shoulder  Stiffness of right shoulder, not elsewhere classified  Localized edema  Rationale for Evaluation and Treatment: Rehabilitation  ONSET DATE: 09/05/22 DOS  Days since surgery: 77   1.  Right shoulder inferior labral  repair 2.  Right shoulder humeral head abrasion arthroplasty 3.  Right shoulder extensive debridement (humeral head, anterior, inferior, posterior labrum)    SUBJECTIVE:                                                                                                                                                                                      SUBJECTIVE STATEMENT: Pt states that the R side feels "pinchy" when doing the no weight OHP and ABD. Feels it with every rep.   PERTINENT HISTORY: L labral repair  PAIN:  Are you having pain? No: NPRS scale: 0/10 Pain location: R shoulder Pain description: sharp/aching Aggravating factors: movement Relieving factors: n/a  PRECAUTIONS: Shoulder   WEIGHT BEARING RESTRICTIONS Yes labral repair   FALLS:  Has patient fallen in last 6 months? No Number of falls: 0   LIVING ENVIRONMENT: Lives with: lives with their family and lives with their spouse     OCCUPATION: Chief Financial Officer, mostly at a computer   PLOF: Independent   PATIENT GOALS exercise, sports- basketball, running, baseball  OBJECTIVE:   DIAGNOSTIC FINDINGS:   IMPRESSION: 1. Moderate right glenohumeral joint osteoarthritis. 2. Superior labral degeneration without a well-defined tear. 3. Mild infraspinatus greater than supraspinatus tendinosis. No rotator cuff tear. 4. Mild intra-articular biceps tendinosis. 5. Moderate arthropathy of the AC joint.    PATIENT SURVEYS:  FOTO 29 68 at DC 23 pts MCII  10/11/22: 47 11/01/22: 62 pts     UPPER EXTREMITY ROM: Full endrange passive range of motion deferred at this time given 3 days postop   Passive ROM Right eval R  1/10 Left eval  Shoulder flexion 50 140 WFL  Shoulder extension    WFL  Shoulder abduction 35 139 WFL  Shoulder adduction       Shoulder internal rotation 35 50 WFL  Shoulder external rotation 0 45 WFL  Elbow flexion 120  WFL  Elbow extension -20  WFL  (Blank rows = not tested)   UPPER EXTREMITY  MMT: full MMT not indicated at this time but pt able to sustain 4/5 resistance in all planes on R      TODAY'S TREATMENT:    1/29   Manual: R GHJ inf and post grade III  STM R pec and deltoid  Self banded OH lat stretch, banded pec stretch, IR/extension stretch 10s 4x each TRX row 3x8 Bosu plank holds 30s 4x Tall plank shoulder taps 10x Inchworm walkouts 10x Kneeling push up 5x  1/22   Manual: R GHJ inf and post grade III Shoulder clocks grade III   STM R pec and deltoid  Self STM with foam roller Foam roller pec stretch/angel 2x12  YTB 90/90 ER 2x10 Standing ABD 1lb 3x10 OHP neutral 1lb 2x10  1/15  UBE L1 2 min retro and fwd each  Manual: R GHJ inf and post grade III Shoulder clocks grade III   Supine pec stretch 5sx10 Manually resisted D1/2 flexion 3x10ea  Modified plantigrade protraction 2x10 RTB ER 2x10 Supine horizontal ABD 10s 5x S/L ABD 1lb 2x10 SA foam roll up wall 2x12 Modified plantigrade alternating shoulder taps x10 Table push up/eccentric lower 3x5 ecc, 2x5 full push up 10lb farmer's carry 31ft 2x laps     PATIENT EDUCATION: Education details: anatomy, progress/exam findings, exercise progression, HEP, POC Person educated: Patient Education method: Explanation, Demonstration, Tactile cues, Verbal cues, and Handouts Education comprehension: verbalized understanding and returned demonstration  HOME EXERCISE PROGRAM:  Access Code: [email or web address] (prefers electronic)  URL: [email or web address]   ASSESSMENT:  CLINICAL IMPRESSION: Pt able to start CKC stability exercise today without increase in pain at the anterior shoulder. Pt did present with R GHJ joint stiffness that was improved with mobilization and STM to the anterior deltoid. Banded self stretching provided today and progression of endurance of shoulder added. Pt does have expected endurance deficits with sustained holds but no recreation of irritation. Pt advised to avoid  wide grip by pressing/pushing movements at this time. Patient will benefit from continued skilled therapy in order to address functional deficits for return to  prior level of function exercise  OBJECTIVE IMPAIRMENTS: decreased ROM, decreased strength, hypomobility, increased edema, increased fascial restrictions, increased muscle spasms, impaired flexibility, impaired sensation, impaired UE functional use, improper body mechanics, postural dysfunction, and pain.   ACTIVITY LIMITATIONS: carrying, lifting, sleeping, transfers, bed mobility, bathing, dressing, reach over head, and hygiene/grooming  PARTICIPATION LIMITATIONS: meal prep, cleaning, laundry, driving, shopping, community activity, occupation, and exercise  PERSONAL FACTORS: Time since onset of injury/illness/exacerbation and 1 comorbidity:    are also affecting patient's functional outcome.     GOALS:   SHORT TERM GOALS: Target date: 10/20/2022    Pt will become independent with HEP in order to demonstrate synthesis of PT education. Baseline: Goal status: achieved  2.  Pt will be able to demonstrate PROM flexion to 140 and ABD to 80 in order to demonstrate functional improvement in UE ROM for progression to next phase of rehab.  Baseline:  Goal status: achieved  3.  Pt will score at least 23 pt increase on FOTO to demonstrate functional improvement in MCII and pt perceived function.   Baseline:  Goal status: achieved    LONG TERM GOALS: Target date: 12/01/2022   Pt  will become independent with final HEP in order to demonstrate synthesis of PT education.  Baseline:  Goal status: ongoing  2.  Pt will score >/= 68 on FOTO to demonstrate improvement in perceived right upper extremity function.  Baseline:  Goal status: ongoing  3.  Pt will be able to demonstrate full AROM in order to demonstrate functional improvement in UE function for self-care and house hold duties.  Baseline:  Goal status: partially met  4.  Pt  will be able to demonstrate ability to lift >/= 3lbs in order to demonstrate functional improvement in UE function for self-care and house hold duties.  Baseline:  Goal status: ongoing  5.  Pt will be able to demonstrate ability to being light to moderate resistance training in order to demonstrate functional improvement in UE function for progression towards exercise.  Baseline:  Goal status: ongoing   PLAN: PT FREQUENCY: 1-2x/week  PT DURATION: 12 weeks  PLANNED INTERVENTIONS: Therapeutic exercises, Therapeutic activity, Neuromuscular re-education, Balance training, Gait training, Patient/Family education, Self Care, Joint mobilization, Joint manipulation, Orthotic/Fit training, DME instructions, Aquatic Therapy, Dry Needling, Electrical stimulation, Spinal manipulation, Spinal mobilization, Cryotherapy, Moist heat, Manual lymph drainage, scar mobilization, Taping, Vasopneumatic device, Traction, Ultrasound, Ionotophoresis 4mg /ml Dexamethasone, Manual therapy, and Re-evaluation  PLAN FOR NEXT SESSION: shoulder instability protocol  Daleen Bo PT, DPT 11/21/22 8:48 AM

## 2022-11-28 ENCOUNTER — Encounter (HOSPITAL_BASED_OUTPATIENT_CLINIC_OR_DEPARTMENT_OTHER): Payer: Self-pay | Admitting: Physical Therapy

## 2022-11-28 ENCOUNTER — Ambulatory Visit (HOSPITAL_BASED_OUTPATIENT_CLINIC_OR_DEPARTMENT_OTHER): Payer: 59 | Attending: Orthopaedic Surgery | Admitting: Physical Therapy

## 2022-11-28 DIAGNOSIS — M25511 Pain in right shoulder: Secondary | ICD-10-CM | POA: Insufficient documentation

## 2022-11-28 DIAGNOSIS — M25611 Stiffness of right shoulder, not elsewhere classified: Secondary | ICD-10-CM | POA: Insufficient documentation

## 2022-11-28 DIAGNOSIS — R600 Localized edema: Secondary | ICD-10-CM | POA: Insufficient documentation

## 2022-11-28 NOTE — Therapy (Signed)
OUTPATIENT PHYSICAL THERAPY UPPER EXTREMITY TREATMENT   Patient Name: Gary Hayes MRN: [id number] DOB:Jun 15, 1976, 47 y.o., male Today's Date: 11/28/2022  END OF SESSION:  PT End of Session - 11/28/22 1344     Visit Number 15    Number of Visits 25    Date for PT Re-Evaluation 12/07/22    Authorization Type [employer]    PT Start Time 1300    PT Stop Time 1340    PT Time Calculation (min) 40 min    Activity Tolerance Patient tolerated treatment well    Behavior During Therapy WFL for tasks assessed/performed                      Past Medical History:  Diagnosis Date   Arthritis    bilateral shoulders   Chicken pox    Frequent headaches    Hay fever    Past Surgical History:  Procedure Laterality Date   SHOULDER ARTHROSCOPY WITH LABRAL REPAIR Left 11/15/2021   Procedure: left SHOULDER ARTHROSCOPY WITH LABRAL REPAIR;  Surgeon: Vanetta Mulders, MD;  Location: [facility];  Service: Orthopedics;  Laterality: Left;   SHOULDER ARTHROSCOPY WITH LABRAL REPAIR Right 09/05/2022   Procedure: RIGHT SHOULDER ARTHROSCOPY WITH LABRAL REPAIR;  Surgeon: Vanetta Mulders, MD;  Location: [facility];  Service: Orthopedics;  Laterality: Right;   WISDOM TOOTH EXTRACTION     Patient Active Problem List   Diagnosis Date Noted   Degenerative superior labral anterior-to-posterior (SLAP) tear of right shoulder    Plantar fasciitis 12/20/2018   Tightness of right gastrocnemius muscle 12/20/2018   Shoulder separation 12/20/2018    PCP: Billie Ruddy, MD   REFERRING PROVIDER:   Vanetta Mulders, MD    REFERRING DIAG:  Diagnosis  [phone] (ICD-10-CM) - Labral tear of shoulder, right, initial encounter    THERAPY DIAG:  Acute pain of right shoulder  Stiffness of right shoulder, not elsewhere classified  Localized edema  Rationale for Evaluation and Treatment: Rehabilitation  ONSET DATE: 09/05/22 DOS  Days since surgery: 84   1.  Right shoulder inferior labral  repair 2.  Right shoulder humeral head abrasion arthroplasty 3.  Right shoulder extensive debridement (humeral head, anterior, inferior, posterior labrum)    SUBJECTIVE:                                                                                                                                                                                      SUBJECTIVE STATEMENT: Pt states the pinching feels better. It is a little stiff today that may be related to lack of full OH stretching.   PERTINENT HISTORY: L labral repair  PAIN:  Are you having pain? No: NPRS scale: 0/10 Pain location: R shoulder Pain description: sharp/aching Aggravating factors: movement Relieving factors: n/a  PRECAUTIONS: Shoulder   WEIGHT BEARING RESTRICTIONS Yes labral repair   FALLS:  Has patient fallen in last 6 months? No Number of falls: 0   LIVING ENVIRONMENT: Lives with: lives with their family and lives with their spouse     OCCUPATION: Chief Financial Officer, mostly at a computer   PLOF: Independent   PATIENT GOALS exercise, sports- basketball, running, baseball  OBJECTIVE:   DIAGNOSTIC FINDINGS:   IMPRESSION: 1. Moderate right glenohumeral joint osteoarthritis. 2. Superior labral degeneration without a well-defined tear. 3. Mild infraspinatus greater than supraspinatus tendinosis. No rotator cuff tear. 4. Mild intra-articular biceps tendinosis. 5. Moderate arthropathy of the AC joint.    PATIENT SURVEYS:  FOTO 29 68 at DC 23 pts MCII  10/11/22: 47 11/01/22: 62 pts     UPPER EXTREMITY ROM: Full endrange passive range of motion deferred at this time given 3 days postop   Passive ROM Right eval R  1/10 Left eval  Shoulder flexion 50 140 WFL  Shoulder extension    WFL  Shoulder abduction 35 139 WFL  Shoulder adduction       Shoulder internal rotation 35 50 WFL  Shoulder external rotation 0 45 WFL  Elbow flexion 120  WFL  Elbow extension -20  WFL  (Blank rows = not tested)   UPPER  EXTREMITY MMT: full MMT not indicated at this time but pt able to sustain 4/5 resistance in all planes on R      TODAY'S TREATMENT:    2/6  UBE 2 min retro and fwd  Manual: R GHJ inf and post grade III   90/90 shoulder IR/ER YTB 2x10 OH wall dribble 15s 4x; double and single UE Form shots with basketball, block and in 10x made  Tennis ball toss 15x from 77ft OH pull down, wide, narrow, and under hand 40lb  1/29   Manual: R GHJ inf and post grade III  STM R pec and deltoid  Self banded OH lat stretch, banded pec stretch, IR/extension stretch 10s 4x each TRX row 3x8 Bosu plank holds 30s 4x Tall plank shoulder taps 10x Inchworm walkouts 10x Kneeling push up 5x  1/22   Manual: R GHJ inf and post grade III Shoulder clocks grade III   STM R pec and deltoid  Self STM with foam roller Foam roller pec stretch/angel 2x12  YTB 90/90 ER 2x10 Standing ABD 1lb 3x10 OHP neutral 1lb 2x10  1/15  UBE L1 2 min retro and fwd each  Manual: R GHJ inf and post grade III Shoulder clocks grade III   Supine pec stretch 5sx10 Manually resisted D1/2 flexion 3x10ea  Modified plantigrade protraction 2x10 RTB ER 2x10 Supine horizontal ABD 10s 5x S/L ABD 1lb 2x10 SA foam roll up wall 2x12 Modified plantigrade alternating shoulder taps x10 Table push up/eccentric lower 3x5 ecc, 2x5 full push up 10lb farmer's carry 8ft 2x laps     PATIENT EDUCATION: Education details: anatomy, progress/exam findings, exercise progression, HEP, POC Person educated: Patient Education method: Explanation, Demonstration, Tactile cues, Verbal cues, and Handouts Education comprehension: verbalized understanding and returned demonstration  HOME EXERCISE PROGRAM:  Access Code: [email or web address] (prefers electronic)  URL: [email or web address]   ASSESSMENT:  CLINICAL IMPRESSION: Pt is now 12 wks at this time. Pt able to start light throwing, below shoulder level power movements, and OH  shooting. Pt without pain but does have  expected strength power deficits at this time. No pain noted throughout session, only tightness into end range ER and ABD/flexion position. Pt cleared to do all gym exercise but advised to have graded/steady progression in order to build baseline strength. Plan to continue with power and OH positions as tolerated. Consider CKC plyos if able. Patient will benefit from continued skilled therapy in order to address functional deficits for return to prior level of function exercise  OBJECTIVE IMPAIRMENTS: decreased ROM, decreased strength, hypomobility, increased edema, increased fascial restrictions, increased muscle spasms, impaired flexibility, impaired sensation, impaired UE functional use, improper body mechanics, postural dysfunction, and pain.   ACTIVITY LIMITATIONS: carrying, lifting, sleeping, transfers, bed mobility, bathing, dressing, reach over head, and hygiene/grooming  PARTICIPATION LIMITATIONS: meal prep, cleaning, laundry, driving, shopping, community activity, occupation, and exercise  PERSONAL FACTORS: Time since onset of injury/illness/exacerbation and 1 comorbidity:    are also affecting patient's functional outcome.     GOALS:   SHORT TERM GOALS: Target date: 10/20/2022    Pt will become independent with HEP in order to demonstrate synthesis of PT education. Baseline: Goal status: achieved  2.  Pt will be able to demonstrate PROM flexion to 140 and ABD to 80 in order to demonstrate functional improvement in UE ROM for progression to next phase of rehab.  Baseline:  Goal status: achieved  3.  Pt will score at least 23 pt increase on FOTO to demonstrate functional improvement in MCII and pt perceived function.   Baseline:  Goal status: achieved    LONG TERM GOALS: Target date: 12/01/2022   Pt  will become independent with final HEP in order to demonstrate synthesis of PT education.  Baseline:  Goal status: ongoing  2.  Pt  will score >/= 68 on FOTO to demonstrate improvement in perceived right upper extremity function.  Baseline:  Goal status: ongoing  3.  Pt will be able to demonstrate full AROM in order to demonstrate functional improvement in UE function for self-care and house hold duties.  Baseline:  Goal status: partially met  4.  Pt will be able to demonstrate ability to lift >/= 3lbs in order to demonstrate functional improvement in UE function for self-care and house hold duties.  Baseline:  Goal status: ongoing  5.  Pt will be able to demonstrate ability to being light to moderate resistance training in order to demonstrate functional improvement in UE function for progression towards exercise.  Baseline:  Goal status: ongoing   PLAN: PT FREQUENCY: 1-2x/week  PT DURATION: 12 weeks  PLANNED INTERVENTIONS: Therapeutic exercises, Therapeutic activity, Neuromuscular re-education, Balance training, Gait training, Patient/Family education, Self Care, Joint mobilization, Joint manipulation, Orthotic/Fit training, DME instructions, Aquatic Therapy, Dry Needling, Electrical stimulation, Spinal manipulation, Spinal mobilization, Cryotherapy, Moist heat, Manual lymph drainage, scar mobilization, Taping, Vasopneumatic device, Traction, Ultrasound, Ionotophoresis 4mg /ml Dexamethasone, Manual therapy, and Re-evaluation  PLAN FOR NEXT SESSION: shoulder instability protocol  Daleen Bo PT, DPT 11/28/22 1:44 PM

## 2022-11-29 ENCOUNTER — Ambulatory Visit (INDEPENDENT_AMBULATORY_CARE_PROVIDER_SITE_OTHER): Payer: 59 | Admitting: Orthopaedic Surgery

## 2022-11-29 DIAGNOSIS — S43431A Superior glenoid labrum lesion of right shoulder, initial encounter: Secondary | ICD-10-CM

## 2022-11-29 NOTE — Progress Notes (Signed)
Post Operative Evaluation    Procedure/Date of Surgery: Right shoulder arthroscopic labral repair and humeral head abrasion arthroplasty 11/14  Interval History:    Today overall doing extremely well.  He has no pain about his shoulder.  He is working on active range of motion as well as strengthening with physical therapy which is going quite well.  PMH/PSH/Family History/Social History/Meds/Allergies:    Past Medical History:  Diagnosis Date   Arthritis    bilateral shoulders   Chicken pox    Frequent headaches    Hay fever    Past Surgical History:  Procedure Laterality Date   SHOULDER ARTHROSCOPY WITH LABRAL REPAIR Left 11/15/2021   Procedure: left SHOULDER ARTHROSCOPY WITH LABRAL REPAIR;  Surgeon: Vanetta Mulders, MD;  Location: [facility];  Service: Orthopedics;  Laterality: Left;   SHOULDER ARTHROSCOPY WITH LABRAL REPAIR Right 09/05/2022   Procedure: RIGHT SHOULDER ARTHROSCOPY WITH LABRAL REPAIR;  Surgeon: Vanetta Mulders, MD;  Location: [facility];  Service: Orthopedics;  Laterality: Right;   WISDOM TOOTH EXTRACTION     Social History   Socioeconomic History   Marital status: Married    Spouse name: Not on file   Number of children: Not on file   Years of education: Not on file   Highest education level: Bachelor's degree (e.g., BA, AB, BS)  Occupational History   Not on file  Tobacco Use   Smoking status: Never   Smokeless tobacco: Never  Vaping Use   Vaping Use: Never used  Substance and Sexual Activity   Alcohol use: Never    Alcohol/week: 0.0 standard drinks of alcohol   Drug use: Never   Sexual activity: Not on file  Other Topics Concern   Not on file  Social History Narrative   Not on file   Social Determinants of Health   Financial Resource Strain: Low Risk  (09/05/2021)   Overall Financial Resource Strain (CARDIA)    Difficulty of Paying Living Expenses: Not hard at all  Food Insecurity: No Food  Insecurity (09/05/2021)   Hunger Vital Sign    Worried About Running Out of Food in the Last Year: Never true    Ran Out of Food in the Last Year: Never true  Transportation Needs: No Transportation Needs (09/05/2021)   PRAPARE - Hydrologist (Medical): No    Lack of Transportation (Non-Medical): No  Physical Activity: Insufficiently Active (09/05/2021)   Exercise Vital Sign    Days of Exercise per Week: 2 days    Minutes of Exercise per Session: 30 min  Stress: No Stress Concern Present (09/05/2021)   [place]    Feeling of Stress : Only a little  Social Connections: Unknown (09/05/2021)   Social Connection and Isolation Panel [NHANES]    Frequency of Communication with Friends and Family: Patient refused    Frequency of Social Gatherings with Friends and Family: Patient refused    Attends Religious Services: Patient refused    Marine scientist or Organizations: No    Attends Music therapist: Not on file    Marital Status: Married   Family History  Problem Relation Age of Onset   Diabetes Mother    Cancer Father    Heart disease Paternal Grandmother  No Known Allergies Current Outpatient Medications  Medication Sig Dispense Refill   aspirin EC 325 MG tablet Take 1 tablet (325 mg total) by mouth daily. 30 tablet 0   cholecalciferol (VITAMIN D3) 25 MCG (1000 UNIT) tablet Take 2,000 Units by mouth daily.     loratadine (CLARITIN) 10 MG tablet Take 10 mg by mouth daily.     oxyCODONE (OXY IR/ROXICODONE) 5 MG immediate release tablet Take 1 tablet (5 mg total) by mouth every 4 (four) hours as needed (severe pain). 20 tablet 0   Pseudoephedrine HCl (SUDAFED SINUS CONGESTION 24HR) 240 MG TB24 Take 240 mg by mouth daily as needed (congestion).     No current facility-administered medications for this visit.   No results found.  Review of Systems:   A ROS was  performed including pertinent positives and negatives as documented in the HPI.   Musculoskeletal Exam:    There were no vitals taken for this visit.  Right shoulder incisions are healed.  Able to flex and extend at the right elbow.  Active elevation of bilateral shoulders is to 170 degrees with external rotation at the side to 50 degrees.  Internal rotation is to T12 bilaterally.  Remainder of distal neurosensory exam is intact  Imaging:      I personally reviewed and interpreted the radiographs.   Assessment:   3 months status post right shoulder arthroscopic labral repair doing very well.  At this time his range of motion is full.  He has no pain about the shoulder.  He will return to clinic as needed.  All limitations and restrictions were discussed  Plan :    -Return to clinic as needed      I personally saw and evaluated the patient, and participated in the management and treatment plan.  Vanetta Mulders, MD Attending Physician, Orthopedic Surgery  This document was dictated using Dragon voice recognition software. A reasonable attempt at proof reading has been made to minimize errors.

## 2022-11-30 ENCOUNTER — Encounter (HOSPITAL_BASED_OUTPATIENT_CLINIC_OR_DEPARTMENT_OTHER): Payer: 59 | Admitting: Physical Therapy

## 2022-12-05 ENCOUNTER — Encounter (HOSPITAL_BASED_OUTPATIENT_CLINIC_OR_DEPARTMENT_OTHER): Payer: 59 | Admitting: Physical Therapy

## 2022-12-07 ENCOUNTER — Encounter (HOSPITAL_BASED_OUTPATIENT_CLINIC_OR_DEPARTMENT_OTHER): Payer: 59 | Admitting: Physical Therapy

## 2022-12-13 ENCOUNTER — Encounter (HOSPITAL_BASED_OUTPATIENT_CLINIC_OR_DEPARTMENT_OTHER): Payer: Self-pay | Admitting: Physical Therapy

## 2022-12-13 ENCOUNTER — Ambulatory Visit (HOSPITAL_BASED_OUTPATIENT_CLINIC_OR_DEPARTMENT_OTHER): Payer: 59 | Admitting: Physical Therapy

## 2022-12-13 DIAGNOSIS — M25511 Pain in right shoulder: Secondary | ICD-10-CM

## 2022-12-13 DIAGNOSIS — M25611 Stiffness of right shoulder, not elsewhere classified: Secondary | ICD-10-CM

## 2022-12-13 DIAGNOSIS — R600 Localized edema: Secondary | ICD-10-CM

## 2022-12-13 NOTE — Therapy (Signed)
OUTPATIENT PHYSICAL THERAPY UPPER EXTREMITY TREATMENT   Patient Name: Gary Hayes MRN: [id number] DOB:[birth year], 47 y.o., male Today's Date: 12/13/2022  END OF SESSION:  PT End of Session - 12/13/22 1506     Visit Number 16    Number of Visits 25    Date for PT Re-Evaluation 12/07/22    Authorization Type U[employer]    PT Start Time 1430    PT Stop Time 1504    PT Time Calculation (min) 34 min    Activity Tolerance Patient tolerated treatment well    Behavior During Therapy WFL for tasks assessed/performed                       Past Medical History:  Diagnosis Date   Arthritis    bilateral shoulders   Chicken pox    Frequent headaches    Hay fever    Past Surgical History:  Procedure Laterality Date   SHOULDER ARTHROSCOPY WITH LABRAL REPAIR Left 11/15/2021   Procedure: left SHOULDER ARTHROSCOPY WITH LABRAL REPAIR;  Surgeon: Vanetta Mulders, MD;  Location: [facility];  Service: Orthopedics;  Laterality: Left;   SHOULDER ARTHROSCOPY WITH LABRAL REPAIR Right 09/05/2022   Procedure: RIGHT SHOULDER ARTHROSCOPY WITH LABRAL REPAIR;  Surgeon: Vanetta Mulders, MD;  Location: [facility];  Service: Orthopedics;  Laterality: Right;   WISDOM TOOTH EXTRACTION     Patient Active Problem List   Diagnosis Date Noted   Degenerative superior labral anterior-to-posterior (SLAP) tear of right shoulder    Plantar fasciitis 12/20/2018   Tightness of right gastrocnemius muscle 12/20/2018   Shoulder separation 12/20/2018    PCP: Billie Ruddy, MD   REFERRING PROVIDER:   Vanetta Mulders, MD    REFERRING DIAG:  Diagnosis  [phone] (ICD-10-CM) - Labral tear of shoulder, right, initial encounter    THERAPY DIAG:  Acute pain of right shoulder  Stiffness of right shoulder, not elsewhere classified  Localized edema  Rationale for Evaluation and Treatment: Rehabilitation  ONSET DATE: 09/05/22 DOS  Days since surgery: 99   1.  Right shoulder inferior  labral repair 2.  Right shoulder humeral head abrasion arthroplasty 3.  Right shoulder extensive debridement (humeral head, anterior, inferior, posterior labrum)    SUBJECTIVE:                                                                                                                                                                                      SUBJECTIVE STATEMENT: Pt states there is a slight pinch at the top still after sitting all. Pt is currently shooting hoops with his son. Feels shooting is still weaker going OH.  Pt is D/C'd by MD.   PERTINENT HISTORY: L labral repair  PAIN:  Are you having pain? No: NPRS scale: 0/10 Pain location: R shoulder Pain description: sharp/aching Aggravating factors: movement Relieving factors: n/a  PRECAUTIONS: Shoulder   WEIGHT BEARING RESTRICTIONS Yes labral repair   FALLS:  Has patient fallen in last 6 months? No Number of falls: 0   LIVING ENVIRONMENT: Lives with: lives with their family and lives with their spouse     OCCUPATION: Chief Financial Officer, mostly at a computer   PLOF: Independent   PATIENT GOALS exercise, sports- basketball, running, baseball  OBJECTIVE:   DIAGNOSTIC FINDINGS:   IMPRESSION: 1. Moderate right glenohumeral joint osteoarthritis. 2. Superior labral degeneration without a well-defined tear. 3. Mild infraspinatus greater than supraspinatus tendinosis. No rotator cuff tear. 4. Mild intra-articular biceps tendinosis. 5. Moderate arthropathy of the AC joint.    PATIENT SURVEYS:  FOTO 29 68 at DC 23 pts MCII  10/11/22: 47 11/01/22: 62 pts     UPPER EXTREMITY ROM: Full endrange passive range of motion deferred at this time given 3 days postop   Passive ROM Right eval R  1/10 Left eval  Shoulder flexion 50 140 WFL  Shoulder extension    WFL  Shoulder abduction 35 139 WFL  Shoulder adduction       Shoulder internal rotation 35 50 WFL  Shoulder external rotation 0 45 WFL  Elbow flexion 120   WFL  Elbow extension -20  WFL  (Blank rows = not tested)   UPPER EXTREMITY MMT: full MMT not indicated at this time but pt able to sustain 4/5 resistance in all planes on R      TODAY'S TREATMENT:     2/21   Manual: R GHJ inf and post grade III  10 lb rack carry 73f 3x laps   10lbs OH hold/march 2x20 Scapular pull up 10x Assisted pull up 5x   Return to throwing program  2/6  UBE 2 min retro and fwd  Manual: R GHJ inf and post grade III   90/90 shoulder IR/ER YTB 2x10 OH wall dribble 15s 4x; double and single UE Form shots with basketball, block and in 10x made  Tennis ball toss 15x from 170fOH pull down, wide, narrow, and under hand 40lb  1/29   Manual: R GHJ inf and post grade III  STM R pec and deltoid  Self banded OH lat stretch, banded pec stretch, IR/extension stretch 10s 4x each TRX row 3x8 Bosu plank holds 30s 4x Tall plank shoulder taps 10x Inchworm walkouts 10x Kneeling push up 5x  1/22   Manual: R GHJ inf and post grade III Shoulder clocks grade III   STM R pec and deltoid  Self STM with foam roller Foam roller pec stretch/angel 2x12  YTB 90/90 ER 2x10 Standing ABD 1lb 3x10 OHP neutral 1lb 2x10  1/15  UBE L1 2 min retro and fwd each  Manual: R GHJ inf and post grade III Shoulder clocks grade III   Supine pec stretch 5sx10 Manually resisted D1/2 flexion 3x10ea  Modified plantigrade protraction 2x10 RTB ER 2x10 Supine horizontal ABD 10s 5x S/L ABD 1lb 2x10 SA foam roll up wall 2x12 Modified plantigrade alternating shoulder taps x10 Table push up/eccentric lower 3x5 ecc, 2x5 full push up 10lb farmer's carry [id number] laps     PATIENT EDUCATION: Education details: anatomy, progress/exam findings, exercise progression, HEP, POC Person educated: Patient Education method: Explanation, Demonstration, Tactile cues, Verbal cues, and Handouts Education  comprehension: verbalized understanding and returned demonstration  HOME  EXERCISE PROGRAM:  Access Code: [email or web address] (prefers electronic)  URL: [email or web address]   ASSESSMENT:  CLINICAL IMPRESSION: Pt is now 12+ wks at this time. Pt with expected OH strength limitations but able to reach [facility] without pain or difficulty. Loaded OH positions all tolerable but with expected instability. Pt advised that he may begin all gym exercise but particular attention needs to be paid to thoughtful strength programming and graded exposure. Pt also provided with [facility] return to throwing program for edu re progression of throwing as able. Pt to start with light tossing and work on strength prior to starting program. Plan to test for strength at next session with dynamometry. Patient will benefit from continued skilled therapy in order to address functional deficits for return to prior level of function exercise  OBJECTIVE IMPAIRMENTS: decreased ROM, decreased strength, hypomobility, increased edema, increased fascial restrictions, increased muscle spasms, impaired flexibility, impaired sensation, impaired UE functional use, improper body mechanics, postural dysfunction, and pain.   ACTIVITY LIMITATIONS: carrying, lifting, sleeping, transfers, bed mobility, bathing, dressing, reach over head, and hygiene/grooming  PARTICIPATION LIMITATIONS: meal prep, cleaning, laundry, driving, shopping, community activity, occupation, and exercise  PERSONAL FACTORS: Time since onset of injury/illness/exacerbation and 1 comorbidity:    are also affecting patient's functional outcome.     GOALS:   SHORT TERM GOALS: Target date: 10/20/2022    Pt will become independent with HEP in order to demonstrate synthesis of PT education. Baseline: Goal status: achieved  2.  Pt will be able to demonstrate PROM flexion to 140 and ABD to 80 in order to demonstrate functional improvement in UE ROM for progression to next phase of rehab.  Baseline:  Goal status: achieved  3.  Pt will score  at least 23 pt increase on FOTO to demonstrate functional improvement in MCII and pt perceived function.   Baseline:  Goal status: achieved    LONG TERM GOALS: Target date: 12/01/2022   Pt  will become independent with final HEP in order to demonstrate synthesis of PT education.  Baseline:  Goal status: met  2.  Pt will score >/= 68 on FOTO to demonstrate improvement in perceived right upper extremity function.  Baseline:  Goal status: ongoing  3.  Pt will be able to demonstrate full AROM in order to demonstrate functional improvement in UE function for self-care and house hold duties.  Baseline:  Goal status: met  4.  Pt will be able to demonstrate ability to lift >/= 3lbs in order to demonstrate functional improvement in UE function for self-care and house hold duties.  Baseline:  Goal status: ongoing  5.  Pt will be able to demonstrate ability to being light to moderate resistance training in order to demonstrate functional improvement in UE function for progression towards exercise.  Baseline:  Goal status: met   PLAN: PT FREQUENCY: 1-2x/week  PT DURATION: 12 weeks  PLANNED INTERVENTIONS: Therapeutic exercises, Therapeutic activity, Neuromuscular re-education, Balance training, Gait training, Patient/Family education, Self Care, Joint mobilization, Joint manipulation, Orthotic/Fit training, DME instructions, Aquatic Therapy, Dry Needling, Electrical stimulation, Spinal manipulation, Spinal mobilization, Cryotherapy, Moist heat, Manual lymph drainage, scar mobilization, Taping, Vasopneumatic device, Traction, Ultrasound, Ionotophoresis 49m/ml Dexamethasone, Manual therapy, and Re-evaluation  PLAN FOR NEXT SESSION: shoulder instability protocol, Re-cert at next at 1x/every 3-4 wks  ADaleen BoPT, DPT 12/13/22 3:10 PM

## 2022-12-27 ENCOUNTER — Encounter (HOSPITAL_BASED_OUTPATIENT_CLINIC_OR_DEPARTMENT_OTHER): Payer: 59 | Admitting: Physical Therapy

## 2023-01-10 ENCOUNTER — Encounter (HOSPITAL_BASED_OUTPATIENT_CLINIC_OR_DEPARTMENT_OTHER): Payer: Self-pay | Admitting: Physical Therapy

## 2023-01-10 ENCOUNTER — Ambulatory Visit (HOSPITAL_BASED_OUTPATIENT_CLINIC_OR_DEPARTMENT_OTHER): Payer: 59 | Attending: Orthopaedic Surgery | Admitting: Physical Therapy

## 2023-01-10 DIAGNOSIS — M25511 Pain in right shoulder: Secondary | ICD-10-CM | POA: Diagnosis present

## 2023-01-10 DIAGNOSIS — M25611 Stiffness of right shoulder, not elsewhere classified: Secondary | ICD-10-CM | POA: Insufficient documentation

## 2023-01-10 NOTE — Therapy (Signed)
OUTPATIENT PHYSICAL THERAPY UPPER EXTREMITY TREATMENT   Patient Name: Gary Hayes MRN: [id number] DOB:[birth year], 47 y.o., male Today's Date: 01/10/2023  END OF SESSION:  PT End of Session - 01/10/23 0833     Visit Number 17    Number of Visits 25    Date for PT Re-Evaluation 04/10/23    Authorization Type [employer]    PT Start Time 0804    PT Stop Time 0843    PT Time Calculation (min) 39 min    Activity Tolerance Patient tolerated treatment well    Behavior During Therapy [facility] for tasks assessed/performed                       Past Medical History:  Diagnosis Date   Arthritis    bilateral shoulders   Chicken pox    Frequent headaches    Hay fever    Past Surgical History:  Procedure Laterality Date   SHOULDER ARTHROSCOPY WITH LABRAL REPAIR Left 11/15/2021   Procedure: left SHOULDER ARTHROSCOPY WITH LABRAL REPAIR;  Surgeon: Vanetta Mulders, MD;  Location: [place];  Service: Orthopedics;  Laterality: Left;   SHOULDER ARTHROSCOPY WITH LABRAL REPAIR Right 09/05/2022   Procedure: RIGHT SHOULDER ARTHROSCOPY WITH LABRAL REPAIR;  Surgeon: Vanetta Mulders, MD;  Location: [place];  Service: Orthopedics;  Laterality: Right;   WISDOM TOOTH EXTRACTION     Patient Active Problem List   Diagnosis Date Noted   Degenerative superior labral anterior-to-posterior (SLAP) tear of right shoulder    Plantar fasciitis 12/20/2018   Tightness of right gastrocnemius muscle 12/20/2018   Shoulder separation 12/20/2018    PCP: Billie Ruddy, MD   REFERRING PROVIDER:   Vanetta Mulders, MD    REFERRING DIAG:  Diagnosis  [phone] (ICD-10-CM) - Labral tear of shoulder, right, initial encounter    THERAPY DIAG:  Acute pain of right shoulder - Plan: PT plan of care cert/re-cert  Stiffness of right shoulder, not elsewhere classified - Plan: PT plan of care cert/re-cert  Rationale for Evaluation and Treatment: Rehabilitation  ONSET DATE: 09/05/22 DOS  Days  since surgery: 127   1.  Right shoulder inferior labral repair 2.  Right shoulder humeral head abrasion arthroplasty 3.  Right shoulder extensive debridement (humeral head, anterior, inferior, posterior labrum)    SUBJECTIVE:                                                                                                                                                                                      SUBJECTIVE STATEMENT: Pt states there is still a mild pinch at the top. Pt still has trouble with a full  pull up but no pain with hanging on it.  PERTINENT HISTORY: L labral repair  PAIN:  Are you having pain? No: NPRS scale: 0/10 Pain location: R shoulder Pain description: sharp/aching Aggravating factors: movement Relieving factors: n/a  PRECAUTIONS: Shoulder   WEIGHT BEARING RESTRICTIONS Yes labral repair   FALLS:  Has patient fallen in last 6 months? No Number of falls: 0   LIVING ENVIRONMENT: Lives with: lives with their family and lives with their spouse     OCCUPATION: Chief Financial Officer, mostly at a computer   PLOF: Independent   PATIENT GOALS exercise, sports- basketball, running, baseball  OBJECTIVE:   DIAGNOSTIC FINDINGS:   IMPRESSION: 1. Moderate right glenohumeral joint osteoarthritis. 2. Superior labral degeneration without a well-defined tear. 3. Mild infraspinatus greater than supraspinatus tendinosis. No rotator cuff tear. 4. Mild intra-articular biceps tendinosis. 5. Moderate arthropathy of the AC joint.    PATIENT SURVEYS:  FOTO 29 68 at DC 23 pts MCII  10/11/22: 47 11/01/22: 62 pts  3/20: 80 pts (exceeded expectations)    UPPER EXTREMITY ROM: Full endrange passive range of motion deferred at this time given 3 days postop   Passive ROM Right eval R  1/10 R  3/20 Left eval  Shoulder flexion 50 140 165 WFL  Shoulder extension     [facility]  Shoulder abduction 35 139 165 WFL  Shoulder adduction        Shoulder internal rotation 35 50 70 WFL   Shoulder external rotation 0 45 80 WFL  Elbow flexion 120   WFL  Elbow extension -20   WFL  (Blank rows = not tested)   UPPER EXTREMITY MMT:  MMT L 3/20 R 3/20  Shoulder flexion 46.4 46.8  Shoulder extension 35.2 43.6  Shoulder abduction 42.7 46.0  Shoulder adduction    Shoulder internal rotation 39.6 34.3  Shoulder external rotation 28.1 24.7  (Blank rows = not tested)      TODAY'S TREATMENT:    3/20  Manual: R GHJ inf, ant, and post grade IV Prone ER ball drop 30s 3x Kneeling ER catch decel 2lb ball 3x8   2/21   Manual: R GHJ inf and post grade III  10 lb rack carry 71ft 3x laps   10lbs OH hold/march 2x20 Scapular pull up 10x Assisted pull up 5x   Return to throwing program  2/6  UBE 2 min retro and fwd  Manual: R GHJ inf and post grade III   90/90 shoulder IR/ER YTB 2x10 OH wall dribble 15s 4x; double and single UE Form shots with basketball, block and in 10x made  Tennis ball toss 15x from 58ft OH pull down, wide, narrow, and under hand 40lb  1/29   Manual: R GHJ inf and post grade III  STM R pec and deltoid  Self banded OH lat stretch, banded pec stretch, IR/extension stretch 10s 4x each TRX row 3x8 Bosu plank holds 30s 4x Tall plank shoulder taps 10x Inchworm walkouts 10x Kneeling push up 5x  1/22   Manual: R GHJ inf and post grade III Shoulder clocks grade III   STM R pec and deltoid  Self STM with foam roller Foam roller pec stretch/angel 2x12  YTB 90/90 ER 2x10 Standing ABD 1lb 3x10 OHP neutral 1lb 2x10  1/15  UBE L1 2 min retro and fwd each  Manual: R GHJ inf and post grade III Shoulder clocks grade III   Supine pec stretch 5sx10 Manually resisted D1/2 flexion 3x10ea  Modified plantigrade protraction 2x10  RTB ER 2x10 Supine horizontal ABD 10s 5x S/L ABD 1lb 2x10 SA foam roll up wall 2x12 Modified plantigrade alternating shoulder taps x10 Table push up/eccentric lower 3x5 ecc, 2x5 full push up 10lb farmer's  carry 50ft 2x laps     PATIENT EDUCATION: Education details: anatomy, progress/exam findings, exercise progression, HEP, POC Person educated: Patient Education method: Explanation, Demonstration, Tactile cues, Verbal cues, and Handouts Education comprehension: verbalized understanding and returned demonstration  HOME EXERCISE PROGRAM:  Access Code: [email or web address] (prefers electronic)  URL: [email or web address]   ASSESSMENT:  CLINICAL IMPRESSION: Pt is now 12+ wks at this time. Pt has met most PT goals at this time with exception of full speed throwing. With HDD, pt is within 90% of the other side but only deficits noted into IR/ER. Pt does have fatigue and endurance deficits into CKC but was able to complete all decel and WB activity today with only minor tightness. Pt HEP updated at this time. Pt has made significant progress and has exceeded FOTO expectations today. Pt to schedule at 1x/month and advised to start throwing progression for return to activity. Patient will benefit from continued skilled therapy in order to address functional deficits for return to prior level of function exercise  OBJECTIVE IMPAIRMENTS: decreased ROM, decreased strength, hypomobility, increased edema, increased fascial restrictions, increased muscle spasms, impaired flexibility, impaired sensation, impaired UE functional use, improper body mechanics, postural dysfunction, and pain.   ACTIVITY LIMITATIONS: carrying, lifting, sleeping, transfers, bed mobility, bathing, dressing, reach over head, and hygiene/grooming  PARTICIPATION LIMITATIONS: meal prep, cleaning, laundry, driving, shopping, community activity, occupation, and exercise  PERSONAL FACTORS: Time since onset of injury/illness/exacerbation and 1 comorbidity:    are also affecting patient's functional outcome.     GOALS:   SHORT TERM GOALS: Target date: 10/20/2022    Pt will become independent with HEP in order to demonstrate  synthesis of PT education. Baseline: Goal status: achieved  2.  Pt will be able to demonstrate PROM flexion to 140 and ABD to 80 in order to demonstrate functional improvement in UE ROM for progression to next phase of rehab.  Baseline:  Goal status: achieved  3.  Pt will score at least 23 pt increase on FOTO to demonstrate functional improvement in MCII and pt perceived function.   Baseline:  Goal status: achieved    LONG TERM GOALS: Target date: 04/04/2023    Pt  will become independent with final HEP in order to demonstrate synthesis of PT education.  Baseline:  Goal status: met  2.  Pt will score >/= 68 on FOTO to demonstrate improvement in perceived right upper extremity function.  Baseline:  Goal status: met  3.  Pt will be able to demonstrate full AROM in order to demonstrate functional improvement in UE function for self-care and house hold duties.  Baseline:  Goal status: met  4.  Pt will be able to demonstrate ability to lift >/= 3lbs in order to demonstrate functional improvement in UE function for self-care and house hold duties.  Baseline:  Goal status: ongoing  5.  Pt will be able to demonstrate ability to being light to moderate resistance training in order to demonstrate functional improvement in UE function for progression towards exercise.  Baseline:  Goal status: met  6. Pt will be able to demonstrate full effort throwing motions in order to demonstrate functional improvement in UE function for return exercise and recreation with his children.  INITIAL  PLAN: PT FREQUENCY: 1x/month  PT DURATION: 3 months (  likely only needs 1-2)  PLANNED INTERVENTIONS: Therapeutic exercises, Therapeutic activity, Neuromuscular re-education, Balance training, Gait training, Patient/Family education, Self Care, Joint mobilization, Joint manipulation, Orthotic/Fit training, DME instructions, Aquatic Therapy, Dry Needling, Electrical stimulation, Spinal manipulation, Spinal  mobilization, Cryotherapy, Moist heat, Manual lymph drainage, scar mobilization, Taping, Vasopneumatic device, Traction, Ultrasound, Ionotophoresis 4mg /ml Dexamethasone, Manual therapy, and Re-evaluation  PLAN FOR NEXT SESSION: CKC stability, CKC landing/catching, seated med ball push  Daleen Bo PT, DPT 01/10/23 9:05 AM

## 2023-01-24 ENCOUNTER — Encounter (HOSPITAL_BASED_OUTPATIENT_CLINIC_OR_DEPARTMENT_OTHER): Payer: 59 | Admitting: Physical Therapy

## 2023-02-16 ENCOUNTER — Encounter (HOSPITAL_BASED_OUTPATIENT_CLINIC_OR_DEPARTMENT_OTHER): Payer: Self-pay | Admitting: Physical Therapy

## 2023-02-16 ENCOUNTER — Ambulatory Visit (HOSPITAL_BASED_OUTPATIENT_CLINIC_OR_DEPARTMENT_OTHER): Payer: 59 | Attending: Orthopaedic Surgery | Admitting: Physical Therapy

## 2023-02-16 DIAGNOSIS — M25611 Stiffness of right shoulder, not elsewhere classified: Secondary | ICD-10-CM | POA: Diagnosis present

## 2023-02-16 DIAGNOSIS — M25511 Pain in right shoulder: Secondary | ICD-10-CM | POA: Diagnosis present

## 2023-02-16 NOTE — Therapy (Signed)
OUTPATIENT PHYSICAL THERAPY UPPER EXTREMITY TREATMENT   Patient Name: Gary Hayes MRN: [id number] DOB:[birth year], 47 y.o., male Today's Date: 02/16/2023  END OF SESSION:  PT End of Session - 02/16/23 0805     Visit Number 18    Number of Visits 25    Date for PT Re-Evaluation 04/10/23    Authorization Type [employer]    PT Start Time 0805    PT Stop Time 0835    PT Time Calculation (min) 30 min    Activity Tolerance Patient tolerated treatment well    Behavior During Therapy [facility] for tasks assessed/performed                       Past Medical History:  Diagnosis Date   Arthritis    bilateral shoulders   Chicken pox    Frequent headaches    Hay fever    Past Surgical History:  Procedure Laterality Date   SHOULDER ARTHROSCOPY WITH LABRAL REPAIR Left 11/15/2021   Procedure: left SHOULDER ARTHROSCOPY WITH LABRAL REPAIR;  Surgeon: [name], MD;  Location: [facility];  Service: Orthopedics;  Laterality: Left;   SHOULDER ARTHROSCOPY WITH LABRAL REPAIR Right 09/05/2022   Procedure: RIGHT SHOULDER ARTHROSCOPY WITH LABRAL REPAIR;  Surgeon: [name], MD;  Location: [facility];  Service: Orthopedics;  Laterality: Right;   WISDOM TOOTH EXTRACTION     Patient Active Problem List   Diagnosis Date Noted   Degenerative superior labral anterior-to-posterior (SLAP) tear of right shoulder    Plantar fasciitis 12/20/2018   Tightness of right gastrocnemius muscle 12/20/2018   Shoulder separation 12/20/2018    PCP: Deeann Saint, MD   REFERRING PROVIDER:   [name], MD    REFERRING DIAG:  Diagnosis  [phone] (ICD-10-CM) - Labral tear of shoulder, right, initial encounter    THERAPY DIAG:  Acute pain of right shoulder  Stiffness of right shoulder, not elsewhere classified  Rationale for Evaluation and Treatment: Rehabilitation  ONSET DATE: 09/05/22 DOS  Days since surgery: 164   1.  Right shoulder inferior labral repair 2.   Right shoulder humeral head abrasion arthroplasty 3.  Right shoulder extensive debridement (humeral head, anterior, inferior, posterior labrum)    SUBJECTIVE:                                                                                                                                                                                      SUBJECTIVE STATEMENT: Pt states he has some tendinitis type irritation in the front of the shoulder that is similar to the L. At most 3/10. He is standing in front of the wall and  throwing a lightly weighted ball at home. Feels still with sitting at a desk for long periods. Llayback position of the throw feels tight and stiff.   PERTINENT HISTORY: L labral repair  PAIN:  Are you having pain? No: NPRS scale: 0/10 Pain location: R shoulder Pain description: sharp/aching Aggravating factors: movement Relieving factors: n/a  PRECAUTIONS: Shoulder   WEIGHT BEARING RESTRICTIONS Yes labral repair   FALLS:  Has patient fallen in last 6 months? No Number of falls: 0   LIVING ENVIRONMENT: Lives with: lives with their family and lives with their spouse     OCCUPATION: Art gallery manager, mostly at a computer   PLOF: Independent   PATIENT GOALS exercise, sports- basketball, running, baseball  OBJECTIVE:   DIAGNOSTIC FINDINGS:   IMPRESSION: 1. Moderate right glenohumeral joint osteoarthritis. 2. Superior labral degeneration without a well-defined tear. 3. Mild infraspinatus greater than supraspinatus tendinosis. No rotator cuff tear. 4. Mild intra-articular biceps tendinosis. 5. Moderate arthropathy of the AC joint.    PATIENT SURVEYS:  FOTO 29 68 at DC 23 pts MCII  10/11/22: 47 11/01/22: 62 pts  3/20: 80 pts (exceeded expectations)    UPPER EXTREMITY ROM: Full endrange passive range of motion deferred at this time given 3 days postop   Passive ROM Right eval R  1/10 R  3/20 Left eval  Shoulder flexion 50 140 165 WFL  Shoulder extension      [facility]  Shoulder abduction 35 139 165 WFL  Shoulder adduction        Shoulder internal rotation 35 50 70 WFL  Shoulder external rotation 0 45 80 WFL  Elbow flexion 120   WFL  Elbow extension -20   WFL  (Blank rows = not tested)   UPPER EXTREMITY MMT:  MMT L 3/20 R 3/20  Shoulder flexion 46.4 46.8  Shoulder extension 35.2 43.6  Shoulder abduction 42.7 46.0  Shoulder adduction    Shoulder internal rotation 39.6 34.3  Shoulder external rotation 28.1 24.7  (Blank rows = not tested)      TODAY'S TREATMENT:    4/26 UBE warm up 2 min fwd and retro  [place] to downward dog 10x Bear dog 10x Kneeling catch/land 3-4x  Side plank to rotation 4x5 (posted up on R)    3/20  Manual: R GHJ inf, ant, and post grade IV Prone ER ball drop 30s 3x Kneeling ER catch decel 2lb ball 3x8   2/21   Manual: R GHJ inf and post grade III  10 lb rack carry 4ft 3x laps   10lbs OH hold/march 2x20 Scapular pull up 10x Assisted pull up 5x   Return to throwing program  2/6  UBE 2 min retro and fwd  Manual: R GHJ inf and post grade III   90/90 shoulder IR/ER YTB 2x10 OH wall dribble 15s 4x; double and single UE Form shots with basketball, block and in 10x made  Tennis ball toss 15x from 15ft OH pull down, wide, narrow, and under hand 40lb  1/29   Manual: R GHJ inf and post grade III  STM R pec and deltoid  Self banded OH lat stretch, banded pec stretch, IR/extension stretch 10s 4x each TRX row 3x8 Bosu plank holds 30s 4x Tall plank shoulder taps 10x Inchworm walkouts 10x Kneeling push up 5x  1/22   Manual: R GHJ inf and post grade III Shoulder clocks grade III   STM R pec and deltoid  Self STM with foam roller Foam roller pec stretch/angel 2x12  YTB 90/90 ER 2x10 Standing ABD 1lb 3x10 OHP neutral 1lb 2x10  1/15  UBE L1 2 min retro and fwd each  Manual: R GHJ inf and post grade III Shoulder clocks grade III   Supine pec stretch 5sx10 Manually resisted D1/2  flexion 3x10ea  Modified plantigrade protraction 2x10 RTB ER 2x10 Supine horizontal ABD 10s 5x S/L ABD 1lb 2x10 SA foam roll up wall 2x12 Modified plantigrade alternating shoulder taps x10 Table push up/eccentric lower 3x5 ecc, 2x5 full push up 10lb farmer's carry 14ft 2x laps     PATIENT EDUCATION: Education details: anatomy, progress/exam findings, exercise progression, HEP, POC Person educated: Patient Education method: Explanation, Demonstration, Tactile cues, Verbal cues, and Handouts Education comprehension: verbalized understanding and returned demonstration  HOME EXERCISE PROGRAM:  Access Code: [email or web address] (prefers electronic)  URL: [email or web address]   ASSESSMENT:  CLINICAL IMPRESSION: Pt able to start CKC stability and dynamic catch/land mechanics with the shoulder. Single UE supported motions, especially dynamic, increase fatigue of the shoulder complex. Pt with mild increase in anterior shoulder discomfort with more ABD and OH type motions that does appear consistent with biceps tendon irritation. Pt advised to reduce frequency of HEP should that continue. HEP updated and trimmed today as pt has begun throwing program. Patient will benefit from continued skilled therapy in order to address functional deficits for return to prior level of function exercise  OBJECTIVE IMPAIRMENTS: decreased ROM, decreased strength, hypomobility, increased edema, increased fascial restrictions, increased muscle spasms, impaired flexibility, impaired sensation, impaired UE functional use, improper body mechanics, postural dysfunction, and pain.   ACTIVITY LIMITATIONS: carrying, lifting, sleeping, transfers, bed mobility, bathing, dressing, reach over head, and hygiene/grooming  PARTICIPATION LIMITATIONS: meal prep, cleaning, laundry, driving, shopping, community activity, occupation, and exercise  PERSONAL FACTORS: Time since onset of injury/illness/exacerbation and 1  comorbidity:    are also affecting patient's functional outcome.     GOALS:   SHORT TERM GOALS: Target date: 10/20/2022    Pt will become independent with HEP in order to demonstrate synthesis of PT education. Baseline: Goal status: achieved  2.  Pt will be able to demonstrate PROM flexion to 140 and ABD to 80 in order to demonstrate functional improvement in UE ROM for progression to next phase of rehab.  Baseline:  Goal status: achieved  3.  Pt will score at least 23 pt increase on FOTO to demonstrate functional improvement in MCII and pt perceived function.   Baseline:  Goal status: achieved    LONG TERM GOALS: Target date: 04/04/2023    Pt  will become independent with final HEP in order to demonstrate synthesis of PT education.  Baseline:  Goal status: met  2.  Pt will score >/= 68 on FOTO to demonstrate improvement in perceived right upper extremity function.  Baseline:  Goal status: met  3.  Pt will be able to demonstrate full AROM in order to demonstrate functional improvement in UE function for self-care and house hold duties.  Baseline:  Goal status: met  4.  Pt will be able to demonstrate ability to lift >/= 3lbs in order to demonstrate functional improvement in UE function for self-care and house hold duties.  Baseline:  Goal status: ongoing  5.  Pt will be able to demonstrate ability to being light to moderate resistance training in order to demonstrate functional improvement in UE function for progression towards exercise.  Baseline:  Goal status: met  6. Pt will be able to demonstrate full effort throwing motions in order to  demonstrate functional improvement in UE function for return exercise and recreation with his children.  INITIAL  PLAN: PT FREQUENCY: 1x/month  PT DURATION: 3 months (likely only needs 1-2)  PLANNED INTERVENTIONS: Therapeutic exercises, Therapeutic activity, Neuromuscular re-education, Balance training, Gait training,  Patient/Family education, Self Care, Joint mobilization, Joint manipulation, Orthotic/Fit training, DME instructions, Aquatic Therapy, Dry Needling, Electrical stimulation, Spinal manipulation, Spinal mobilization, Cryotherapy, Moist heat, Manual lymph drainage, scar mobilization, Taping, Vasopneumatic device, Traction, Ultrasound, Ionotophoresis 4mg /ml Dexamethasone, Manual therapy, and Re-evaluation  PLAN FOR NEXT SESSION: CKC stability, CKC landing/catching, seated med ball push  Zebedee Iba PT, DPT 02/16/23 8:41 AM

## 2023-03-15 ENCOUNTER — Ambulatory Visit (HOSPITAL_BASED_OUTPATIENT_CLINIC_OR_DEPARTMENT_OTHER): Payer: 59 | Attending: Orthopaedic Surgery | Admitting: Physical Therapy

## 2023-03-15 ENCOUNTER — Encounter (HOSPITAL_BASED_OUTPATIENT_CLINIC_OR_DEPARTMENT_OTHER): Payer: Self-pay | Admitting: Physical Therapy

## 2023-03-15 DIAGNOSIS — M25611 Stiffness of right shoulder, not elsewhere classified: Secondary | ICD-10-CM | POA: Diagnosis present

## 2023-03-15 DIAGNOSIS — M25511 Pain in right shoulder: Secondary | ICD-10-CM | POA: Diagnosis present

## 2023-03-15 DIAGNOSIS — R600 Localized edema: Secondary | ICD-10-CM | POA: Insufficient documentation

## 2023-03-15 NOTE — Therapy (Addendum)
OUTPATIENT PHYSICAL THERAPY UPPER EXTREMITY TREATMENT   PHYSICAL THERAPY DISCHARGE SUMMARY  Visits from Start of Care: 19  Plan: Patient agrees to discharge.  Patient goals were met. Patient is being discharged due to meeting the stated rehab goals.          Patient Name: Gary Hayes MRN: [id number] DOB:[birth year], 47 y.o., male Today's Date: 03/15/2023  END OF SESSION:  PT End of Session - 03/15/23 0806     Visit Number 19    Number of Visits 25    Date for PT Re-Evaluation 04/10/23    Authorization Type [employer]    PT Start Time 0801    PT Stop Time 0841    PT Time Calculation (min) 40 min    Activity Tolerance Patient tolerated treatment well    Behavior During Therapy [facility] for tasks assessed/performed                       Past Medical History:  Diagnosis Date   Arthritis    bilateral shoulders   Chicken pox    Frequent headaches    Hay fever    Past Surgical History:  Procedure Laterality Date   SHOULDER ARTHROSCOPY WITH LABRAL REPAIR Left 11/15/2021   Procedure: left SHOULDER ARTHROSCOPY WITH LABRAL REPAIR;  Surgeon: [name], MD;  Location: [facility];  Service: Orthopedics;  Laterality: Left;   SHOULDER ARTHROSCOPY WITH LABRAL REPAIR Right 09/05/2022   Procedure: RIGHT SHOULDER ARTHROSCOPY WITH LABRAL REPAIR;  Surgeon: [name], MD;  Location: [facility];  Service: Orthopedics;  Laterality: Right;   WISDOM TOOTH EXTRACTION     Patient Active Problem List   Diagnosis Date Noted   Degenerative superior labral anterior-to-posterior (SLAP) tear of right shoulder    Plantar fasciitis 12/20/2018   Tightness of right gastrocnemius muscle 12/20/2018   Shoulder separation 12/20/2018    PCP: Deeann Saint, MD   REFERRING PROVIDER:   [name], MD    REFERRING DIAG:  Diagnosis  [phone] (ICD-10-CM) - Labral tear of shoulder, right, initial encounter    THERAPY DIAG:  Acute pain of right  shoulder  Stiffness of right shoulder, not elsewhere classified  Localized edema  Rationale for Evaluation and Treatment: Rehabilitation  ONSET DATE: 09/05/22 DOS  Days since surgery: 191   1.  Right shoulder inferior labral repair 2.  Right shoulder humeral head abrasion arthroplasty 3.  Right shoulder extensive debridement (humeral head, anterior, inferior, posterior labrum)    SUBJECTIVE:  SUBJECTIVE STATEMENT: Pt states he has some tendinitis type irritation in the front of the shoulder.  At most 3/10. Pt feels that it may be related to seated work that is causing stiffness.   PERTINENT HISTORY: L labral repair  PAIN:  Are you having pain? No: NPRS scale: 0/10 Pain location: R shoulder Pain description: sharp/aching Aggravating factors: movement Relieving factors: n/a  PRECAUTIONS: Shoulder   WEIGHT BEARING RESTRICTIONS Yes labral repair   FALLS:  Has patient fallen in last 6 months? No Number of falls: 0   LIVING ENVIRONMENT: Lives with: lives with their family and lives with their spouse     OCCUPATION: Art gallery manager, mostly at a computer   PLOF: Independent   PATIENT GOALS exercise, sports- basketball, running, baseball  OBJECTIVE:   DIAGNOSTIC FINDINGS:   IMPRESSION: 1. Moderate right glenohumeral joint osteoarthritis. 2. Superior labral degeneration without a well-defined tear. 3. Mild infraspinatus greater than supraspinatus tendinosis. No rotator cuff tear. 4. Mild intra-articular biceps tendinosis. 5. Moderate arthropathy of the AC joint.    PATIENT SURVEYS:  FOTO 29 68 at DC 23 pts MCII  10/11/22: 47 11/01/22: 62 pts  3/20: 80 pts (exceeded expectations)    UPPER EXTREMITY ROM: Full endrange passive range of motion deferred at this time given 3 days postop    Passive ROM Right eval R  1/10 R  3/20 Left eval  Shoulder flexion 50 140 165 WFL  Shoulder extension     [facility]  Shoulder abduction 35 139 165 WFL  Shoulder adduction        Shoulder internal rotation 35 50 70 WFL  Shoulder external rotation 0 45 80 WFL  Elbow flexion 120   WFL  Elbow extension -20   WFL  (Blank rows = not tested)   UPPER EXTREMITY MMT:  MMT L 3/20 R 3/20  Shoulder flexion 46.4 46.8  Shoulder extension 35.2 43.6  Shoulder abduction 42.7 46.0  Shoulder adduction    Shoulder internal rotation 39.6 34.3  Shoulder external rotation 28.1 24.7  (Blank rows = not tested)      TODAY'S TREATMENT:    5/23  UBE warm up 2 min fwd and retro R GHJ inf and prone mob grade IV end range  Kneeling catch land 4x5  Seated medball push 3x3 8lbs Foam roller pec angle 2x10 90/90 wall ball toss 2x20 Swisball ball plank roll out 3x short and long arm   4/26 UBE warm up 2 min fwd and retro  Cobra to downward dog 10x Bear dog 10x Kneeling catch/land 3-4x  Side plank to rotation 4x5 (posted up on R)    3/20  Manual: R GHJ inf, ant, and post grade IV Prone ER ball drop 30s 3x Kneeling ER catch decel 2lb ball 3x8   2/21   Manual: R GHJ inf and post grade III  10 lb rack carry 75ft 3x laps   10lbs OH hold/march 2x20 Scapular pull up 10x Assisted pull up 5x   Return to throwing program  2/6  UBE 2 min retro and fwd  Manual: R GHJ inf and post grade III   90/90 shoulder IR/ER YTB 2x10 OH wall dribble 15s 4x; double and single UE Form shots with basketball, block and in 10x made  Tennis ball toss 15x from 65ft OH pull down, wide, narrow, and under hand 40lb  1/29   Manual: R GHJ inf and post grade III  STM R pec and deltoid  Self banded OH lat stretch, banded pec  stretch, IR/extension stretch 10s 4x each TRX row 3x8 Bosu plank holds 30s 4x Tall plank shoulder taps 10x Inchworm walkouts 10x Kneeling push up 5x  1/22   Manual: R GHJ inf and  post grade III Shoulder clocks grade III   STM R pec and deltoid  Self STM with foam roller Foam roller pec stretch/angel 2x12  YTB 90/90 ER 2x10 Standing ABD 1lb 3x10 OHP neutral 1lb 2x10  1/15  UBE L1 2 min retro and fwd each  Manual: R GHJ inf and post grade III Shoulder clocks grade III   Supine pec stretch 5sx10 Manually resisted D1/2 flexion 3x10ea  Modified plantigrade protraction 2x10 RTB ER 2x10 Supine horizontal ABD 10s 5x S/L ABD 1lb 2x10 SA foam roll up wall 2x12 Modified plantigrade alternating shoulder taps x10 Table push up/eccentric lower 3x5 ecc, 2x5 full push up 10lb farmer's carry 58ft 2x laps     PATIENT EDUCATION: Education details: anatomy, progress/exam findings, exercise progression, HEP, POC Person educated: Patient Education method: Explanation, Demonstration, Tactile cues, Verbal cues, and Handouts Education comprehension: verbalized understanding and returned demonstration  HOME EXERCISE PROGRAM:  Access Code: [email or web address] (prefers electronic)  URL: [email or web address]   ASSESSMENT:  CLINICAL IMPRESSION: Pt does present with increase in anterior shoulder stiffness that was improved with joint mob. ROM improved but tendinopathy type discomfort persists but does not reach >3/10 during session. Pt was able to progress to more RFD exercise at shoulder lever today. HEP modified to reduce OH irritation and to promote progression of R scapular stability and GHJ strength. Patient will benefit from continued skilled therapy in order to address functional deficits for return to prior level of function exercise  OBJECTIVE IMPAIRMENTS: decreased ROM, decreased strength, hypomobility, increased edema, increased fascial restrictions, increased muscle spasms, impaired flexibility, impaired sensation, impaired UE functional use, improper body mechanics, postural dysfunction, and pain.   ACTIVITY LIMITATIONS: carrying, lifting, sleeping,  transfers, bed mobility, bathing, dressing, reach over head, and hygiene/grooming  PARTICIPATION LIMITATIONS: meal prep, cleaning, laundry, driving, shopping, community activity, occupation, and exercise  PERSONAL FACTORS: Time since onset of injury/illness/exacerbation and 1 comorbidity:    are also affecting patient's functional outcome.     GOALS:   SHORT TERM GOALS: Target date: 10/20/2022    Pt will become independent with HEP in order to demonstrate synthesis of PT education. Baseline: Goal status: achieved  2.  Pt will be able to demonstrate PROM flexion to 140 and ABD to 80 in order to demonstrate functional improvement in UE ROM for progression to next phase of rehab.  Baseline:  Goal status: achieved  3.  Pt will score at least 23 pt increase on FOTO to demonstrate functional improvement in MCII and pt perceived function.   Baseline:  Goal status: achieved    LONG TERM GOALS: Target date: 04/04/2023    Pt  will become independent with final HEP in order to demonstrate synthesis of PT education.  Baseline:  Goal status: met  2.  Pt will score >/= 68 on FOTO to demonstrate improvement in perceived right upper extremity function.  Baseline:  Goal status: met  3.  Pt will be able to demonstrate full AROM in order to demonstrate functional improvement in UE function for self-care and house hold duties.  Baseline:  Goal status: met  4.  Pt will be able to demonstrate ability to lift >/= 3lbs in order to demonstrate functional improvement in UE function for self-care and house hold duties.  Baseline:  Goal status: MET  5.  Pt will be able to demonstrate ability to being light to moderate resistance training in order to demonstrate functional improvement in UE function for progression towards exercise.  Baseline:  Goal status: met  6. Pt will be able to demonstrate full effort throwing motions in order to demonstrate functional improvement in UE function for  return exercise and recreation with his children.  Partially met  PLAN: PT FREQUENCY: 1x/month  PT DURATION: 3 months (likely only needs 1-2)  PLANNED INTERVENTIONS: Therapeutic exercises, Therapeutic activity, Neuromuscular re-education, Balance training, Gait training, Patient/Family education, Self Care, Joint mobilization, Joint manipulation, Orthotic/Fit training, DME instructions, Aquatic Therapy, Dry Needling, Electrical stimulation, Spinal manipulation, Spinal mobilization, Cryotherapy, Moist heat, Manual lymph drainage, scar mobilization, Taping, Vasopneumatic device, Traction, Ultrasound, Ionotophoresis 4mg /ml Dexamethasone, Manual therapy, and Re-evaluation  PLAN FOR NEXT SESSION: CKC stability, CKC landing/catching, seated med ball push  Zebedee Iba PT, DPT 03/15/23 8:49 AM

## 2023-04-06 ENCOUNTER — Encounter (HOSPITAL_BASED_OUTPATIENT_CLINIC_OR_DEPARTMENT_OTHER): Payer: Self-pay

## 2023-04-06 ENCOUNTER — Ambulatory Visit (HOSPITAL_BASED_OUTPATIENT_CLINIC_OR_DEPARTMENT_OTHER): Payer: 59 | Admitting: Physical Therapy

## 2023-06-07 ENCOUNTER — Ambulatory Visit (INDEPENDENT_AMBULATORY_CARE_PROVIDER_SITE_OTHER): Payer: 59 | Admitting: Family Medicine

## 2023-06-07 ENCOUNTER — Encounter: Payer: Self-pay | Admitting: Family Medicine

## 2023-06-07 VITALS — BP 120/72 | HR 85 | Temp 98.1°F | Ht 68.3 in | Wt 185.6 lb

## 2023-06-07 DIAGNOSIS — Z13220 Encounter for screening for lipoid disorders: Secondary | ICD-10-CM

## 2023-06-07 DIAGNOSIS — L720 Epidermal cyst: Secondary | ICD-10-CM

## 2023-06-07 DIAGNOSIS — Z1211 Encounter for screening for malignant neoplasm of colon: Secondary | ICD-10-CM | POA: Diagnosis not present

## 2023-06-07 DIAGNOSIS — Z131 Encounter for screening for diabetes mellitus: Secondary | ICD-10-CM | POA: Diagnosis not present

## 2023-06-07 DIAGNOSIS — Z0001 Encounter for general adult medical examination with abnormal findings: Secondary | ICD-10-CM | POA: Diagnosis not present

## 2023-06-07 DIAGNOSIS — Z1159 Encounter for screening for other viral diseases: Secondary | ICD-10-CM | POA: Diagnosis not present

## 2023-06-07 LAB — CBC WITH DIFFERENTIAL/PLATELET
Basophils Absolute: 0 10*3/uL (ref 0.0–0.1)
Basophils Relative: 0.5 % (ref 0.0–3.0)
Eosinophils Absolute: 0.1 10*3/uL (ref 0.0–0.7)
Eosinophils Relative: 2.1 % (ref 0.0–5.0)
HCT: 41.4 % (ref 39.0–52.0)
Hemoglobin: 13.5 g/dL (ref 13.0–17.0)
Lymphocytes Relative: 41.5 % (ref 12.0–46.0)
Lymphs Abs: 2.5 10*3/uL (ref 0.7–4.0)
MCHC: 32.6 g/dL (ref 30.0–36.0)
MCV: 85 fl (ref 78.0–100.0)
Monocytes Absolute: 0.6 10*3/uL (ref 0.1–1.0)
Monocytes Relative: 9.9 % (ref 3.0–12.0)
Neutro Abs: 2.7 10*3/uL (ref 1.4–7.7)
Neutrophils Relative %: 46 % (ref 43.0–77.0)
Platelets: 208 10*3/uL (ref 150.0–400.0)
RBC: 4.87 Mil/uL (ref 4.22–5.81)
RDW: 13.8 % (ref 11.5–15.5)
WBC: 5.9 10*3/uL (ref 4.0–10.5)

## 2023-06-07 LAB — LDL CHOLESTEROL, DIRECT: Direct LDL: 115 mg/dL

## 2023-06-07 LAB — COMPREHENSIVE METABOLIC PANEL
ALT: 27 U/L (ref 0–53)
AST: 26 U/L (ref 0–37)
Albumin: 4.7 g/dL (ref 3.5–5.2)
Alkaline Phosphatase: 43 U/L (ref 39–117)
BUN: 21 mg/dL (ref 6–23)
CO2: 26 mEq/L (ref 19–32)
Calcium: 9.8 mg/dL (ref 8.4–10.5)
Chloride: 103 mEq/L (ref 96–112)
Creatinine, Ser: 1.13 mg/dL (ref 0.40–1.50)
GFR: 76.74 mL/min (ref >60.00)
Glucose, Bld: 83 mg/dL (ref 70–99)
Potassium: 4.1 mEq/L (ref 3.5–5.1)
Sodium: 136 mEq/L (ref 135–145)
Total Bilirubin: 0.7 mg/dL (ref 0.2–1.2)
Total Protein: 7.6 g/dL (ref 6.0–8.3)

## 2023-06-07 LAB — LIPID PANEL
Cholesterol: 182 mg/dL (ref 0–200)
HDL: 48.1 mg/dL (ref >39.00)
NonHDL: 134.2
Total CHOL/HDL Ratio: 4
Triglycerides: 202 mg/dL — ABNORMAL HIGH (ref 0.0–149.0)
VLDL: 40.4 mg/dL — ABNORMAL HIGH (ref 0.0–40.0)

## 2023-06-07 LAB — TSH: TSH: 2.45 u[IU]/mL (ref 0.35–5.50)

## 2023-06-07 LAB — T4, FREE: Free T4: 0.92 ng/dL (ref 0.60–1.60)

## 2023-06-07 LAB — HEMOGLOBIN A1C: Hgb A1c MFr Bld: 5.9 % (ref 4.6–6.5)

## 2023-06-07 NOTE — Patient Instructions (Signed)
A referral to gastroenterologist was placed for colonoscopy.  They will contact you regarding setting up the appointment.

## 2023-06-07 NOTE — Progress Notes (Signed)
Established Patient Office Visit   Subjective  Patient ID: Gary Hayes, male    DOB: 12/01/1975  Age: 47 y.o. MRN: [id number]  Chief Complaint  Patient presents with   Annual Exam    Patient is a 47 year old male seen for CPE.  Patient states he has been doing well overall.  Since last office visit in 2022 he had surgery on bilateral shoulders.  Notices some stiffness in the muscles of right shoulder.  Patient inquires about colon cancer screening and immunizations.  Patient denies family history of colon cancer.    Past Medical History:  Diagnosis Date   Arthritis    bilateral shoulders   Chicken pox    Frequent headaches    Hay fever    Past Surgical History:  Procedure Laterality Date   SHOULDER ARTHROSCOPY WITH LABRAL REPAIR Left 11/15/2021   Procedure: left SHOULDER ARTHROSCOPY WITH LABRAL REPAIR;  Surgeon: [name], MD;  Location: [facility];  Service: Orthopedics;  Laterality: Left;   SHOULDER ARTHROSCOPY WITH LABRAL REPAIR Right 09/05/2022   Procedure: RIGHT SHOULDER ARTHROSCOPY WITH LABRAL REPAIR;  Surgeon: [name], MD;  Location: [facility];  Service: Orthopedics;  Laterality: Right;   WISDOM TOOTH EXTRACTION     Social History   Tobacco Use   Smoking status: Never   Smokeless tobacco: Never  Vaping Use   Vaping status: Never Used  Substance Use Topics   Alcohol use: Never    Alcohol/week: 0.0 standard drinks of alcohol   Drug use: Never   Family History  Problem Relation Age of Onset   Diabetes Mother    Cancer Father    Heart disease Paternal Grandmother    No Known Allergies    ROS Negative unless stated above    Objective:     BP 120/72 (BP Location: Left Arm, Patient Position: Sitting, Cuff Size: Normal)   Pulse 85   Temp 98.1 F (36.7 C) (Oral)   Ht 5' 8.3" (1.735 m)   Wt 185 lb 9.6 oz (84.2 kg)   SpO2 97%   BMI 27.97 kg/m  BP Readings from Last 3 Encounters:  06/07/23 120/72  09/05/22 109/79  11/15/21  110/76   Wt Readings from Last 3 Encounters:  06/07/23 185 lb 9.6 oz (84.2 kg)  09/05/22 182 lb 5.1 oz (82.7 kg)  11/15/21 177 lb (80.3 kg)      Physical Exam Constitutional:      Appearance: Normal appearance.  HENT:     Head: Normocephalic and atraumatic.     Right Ear: Tympanic membrane, ear canal and external ear normal.     Left Ear: Tympanic membrane, ear canal and external ear normal.     Nose: Nose normal.     Mouth/Throat:     Mouth: Mucous membranes are moist.     Pharynx: No oropharyngeal exudate or posterior oropharyngeal erythema.  Eyes:     General: No scleral icterus.    Extraocular Movements: Extraocular movements intact.     Conjunctiva/sclera: Conjunctivae normal.     Pupils: Pupils are equal, round, and reactive to light.  Neck:     Thyroid: No thyromegaly.  Cardiovascular:     Rate and Rhythm: Normal rate and regular rhythm.     Pulses: Normal pulses.     Heart sounds: Normal heart sounds. No murmur heard.    No friction rub.  Pulmonary:     Effort: Pulmonary effort is normal.     Breath sounds: Normal breath sounds.  No wheezing, rhonchi or rales.  Abdominal:     General: Bowel sounds are normal.     Palpations: Abdomen is soft.     Tenderness: There is no abdominal tenderness.  Musculoskeletal:        General: No deformity. Normal range of motion.  Lymphadenopathy:     Cervical: No cervical adenopathy.  Skin:    General: Skin is warm and dry.     Findings: No lesion.     Comments: Round mobile 6 mm cystic like nodule underneath skin of right lateral neck.  Similar lesion in scalp of right anterior lateral parietal area.  Nonpainful.  Without drainage or erythema.  Neurological:     General: No focal deficit present.     Mental Status: He is alert and oriented to person, place, and time.  Psychiatric:        Mood and Affect: Mood normal.        Thought Content: Thought content normal.       06/07/2023   10:03 AM  Depression screen PHQ 2/9   Decreased Interest 0  Down, Depressed, Hopeless 0  PHQ - 2 Score 0  Altered sleeping 0  Tired, decreased energy 0  Change in appetite 0  Feeling bad or failure about yourself  0  Trouble concentrating 0  Moving slowly or fidgety/restless 0  Suicidal thoughts 0  PHQ-9 Score 0      06/07/2023   10:08 AM 06/07/2023   10:03 AM  GAD 7 : Generalized Anxiety Score  Nervous, Anxious, on Edge 0 0  Control/stop worrying 0 0  Worry too much - different things 0 0  Trouble relaxing 0 0  Restless 0 0  Easily annoyed or irritable 0 0  Afraid - awful might happen 0 0  Total GAD 7 Score 0 0  Anxiety Difficulty Not difficult at all Not difficult at all      No results found for any visits on 06/07/23.    Assessment & Plan:  Encounter for routine adult health examination with abnormal findings -     CBC with Differential/Platelet -     TSH -     T4, free -     Hemoglobin A1c -     Lipid panel -     Comprehensive metabolic panel  Epidermoid cyst of skin  Colon cancer screening -     Ambulatory referral to Gastroenterology  Encounter for hepatitis C screening test for low risk patient -     Hepatitis C antibody  Age-appropriate health screenings discussed.  Immunizations reviewed.  Consider Tdap, influenza vaccine.  Referral to GI placed for colonoscopy.  No prior family history of colon cancer noted.  Epidermoid cyst likely in scalp and right lateral neck.  Offered Derm referral.  Patient wishes to wait at this time.  Continue to monitor.  Return if symptoms worsen or fail to improve.   Deeann Saint, MD

## 2023-06-08 LAB — HEPATITIS C ANTIBODY: Hepatitis C Ab: NONREACTIVE
